# Patient Record
Sex: Male | Born: 1935 | Race: White | Hispanic: No | Marital: Single | Smoking: Former smoker
Health system: Southern US, Community
[De-identification: ages and names within clinical notes are randomized; demographics above are authoritative.]

## PROBLEM LIST (undated history)

## (undated) DIAGNOSIS — C61 Malignant neoplasm of prostate: Secondary | ICD-10-CM

## (undated) DIAGNOSIS — E785 Hyperlipidemia, unspecified: Secondary | ICD-10-CM

## (undated) DIAGNOSIS — I714 Abdominal aortic aneurysm, without rupture, unspecified: Secondary | ICD-10-CM

## (undated) DIAGNOSIS — I480 Paroxysmal atrial fibrillation: Secondary | ICD-10-CM

## (undated) DIAGNOSIS — R011 Cardiac murmur, unspecified: Secondary | ICD-10-CM

## (undated) DIAGNOSIS — J189 Pneumonia, unspecified organism: Secondary | ICD-10-CM

## (undated) DIAGNOSIS — I6529 Occlusion and stenosis of unspecified carotid artery: Secondary | ICD-10-CM

## (undated) DIAGNOSIS — E039 Hypothyroidism, unspecified: Secondary | ICD-10-CM

## (undated) DIAGNOSIS — I2510 Atherosclerotic heart disease of native coronary artery without angina pectoris: Secondary | ICD-10-CM

## (undated) DIAGNOSIS — K5900 Constipation, unspecified: Secondary | ICD-10-CM

## (undated) DIAGNOSIS — I10 Essential (primary) hypertension: Secondary | ICD-10-CM

## (undated) DIAGNOSIS — K219 Gastro-esophageal reflux disease without esophagitis: Secondary | ICD-10-CM

## (undated) DIAGNOSIS — I350 Nonrheumatic aortic (valve) stenosis: Secondary | ICD-10-CM

## (undated) DIAGNOSIS — D369 Benign neoplasm, unspecified site: Secondary | ICD-10-CM

## (undated) HISTORY — DX: Occlusion and stenosis of unspecified carotid artery: I65.29

## (undated) HISTORY — DX: Benign neoplasm, unspecified site: D36.9

## (undated) HISTORY — DX: Abdominal aortic aneurysm, without rupture, unspecified: I71.40

## (undated) HISTORY — DX: Paroxysmal atrial fibrillation: I48.0

## (undated) HISTORY — DX: Hyperlipidemia, unspecified: E78.5

## (undated) HISTORY — DX: Malignant neoplasm of prostate: C61

## (undated) HISTORY — PX: OTHER SURGICAL HISTORY: SHX169

## (undated) HISTORY — DX: Atherosclerotic heart disease of native coronary artery without angina pectoris: I25.10

## (undated) HISTORY — PX: CORONARY ARTERY BYPASS GRAFT: SHX141

## (undated) HISTORY — DX: Hypothyroidism, unspecified: E03.9

## (undated) HISTORY — DX: Essential (primary) hypertension: I10

## (undated) HISTORY — DX: Nonrheumatic aortic (valve) stenosis: I35.0

---

## 1999-03-17 ENCOUNTER — Encounter: Payer: Self-pay | Admitting: Emergency Medicine

## 1999-03-18 ENCOUNTER — Inpatient Hospital Stay (HOSPITAL_COMMUNITY): Admission: EM | Admit: 1999-03-18 | Discharge: 1999-03-25 | Payer: Self-pay | Admitting: Emergency Medicine

## 1999-03-19 ENCOUNTER — Encounter: Payer: Self-pay | Admitting: Cardiology

## 1999-03-20 ENCOUNTER — Encounter: Payer: Self-pay | Admitting: Thoracic Surgery (Cardiothoracic Vascular Surgery)

## 1999-03-21 ENCOUNTER — Encounter: Payer: Self-pay | Admitting: Thoracic Surgery (Cardiothoracic Vascular Surgery)

## 1999-03-22 ENCOUNTER — Encounter: Payer: Self-pay | Admitting: Thoracic Surgery (Cardiothoracic Vascular Surgery)

## 1999-07-02 ENCOUNTER — Observation Stay (HOSPITAL_COMMUNITY): Admission: RE | Admit: 1999-07-02 | Discharge: 1999-07-03 | Payer: Self-pay | Admitting: Cardiology

## 1999-07-02 HISTORY — PX: RENAL ARTERY STENT: SHX2321

## 1999-08-31 ENCOUNTER — Other Ambulatory Visit: Admission: RE | Admit: 1999-08-31 | Discharge: 1999-08-31 | Payer: Self-pay | Admitting: Urology

## 1999-09-13 ENCOUNTER — Encounter: Payer: Self-pay | Admitting: Urology

## 1999-09-13 ENCOUNTER — Encounter: Admission: RE | Admit: 1999-09-13 | Discharge: 1999-09-13 | Payer: Self-pay | Admitting: Urology

## 1999-09-24 ENCOUNTER — Encounter: Admission: RE | Admit: 1999-09-24 | Discharge: 1999-12-23 | Payer: Self-pay | Admitting: Radiation Oncology

## 2008-11-06 ENCOUNTER — Inpatient Hospital Stay (HOSPITAL_COMMUNITY): Admission: EM | Admit: 2008-11-06 | Discharge: 2008-11-08 | Payer: Self-pay | Admitting: Emergency Medicine

## 2010-08-30 ENCOUNTER — Telehealth: Payer: Self-pay

## 2010-08-30 NOTE — Telephone Encounter (Signed)
Records received Via Mail from [address] [facility] gave to [place] for NP appt to be made. 08/30/10/KM

## 2010-09-03 LAB — COMPREHENSIVE METABOLIC PANEL
AST: 19 U/L (ref 0–37)
AST: 22 U/L (ref 0–37)
Alkaline Phosphatase: 58 U/L (ref 39–117)
Alkaline Phosphatase: 70 U/L (ref 39–117)
BUN: 30 mg/dL — ABNORMAL HIGH (ref 6–23)
CO2: 23 mEq/L (ref 19–32)
CO2: 25 mEq/L (ref 19–32)
Chloride: 113 mEq/L — ABNORMAL HIGH (ref 96–112)
Chloride: 115 mEq/L — ABNORMAL HIGH (ref 96–112)
Creatinine, Ser: 1.5 mg/dL (ref 0.4–1.5)
Creatinine, Ser: 1.53 mg/dL — ABNORMAL HIGH (ref 0.4–1.5)
GFR calc Af Amer: 54 mL/min — ABNORMAL LOW (ref >60)
GFR calc Af Amer: 55 mL/min — ABNORMAL LOW (ref >60)
GFR calc non Af Amer: 45 mL/min — ABNORMAL LOW (ref >60)
GFR calc non Af Amer: 46 mL/min — ABNORMAL LOW (ref >60)
Potassium: 4.1 mEq/L (ref 3.5–5.1)
Total Bilirubin: 0.7 mg/dL (ref 0.3–1.2)
Total Bilirubin: 0.9 mg/dL (ref 0.3–1.2)

## 2010-09-03 LAB — LEGIONELLA ANTIGEN, URINE: Legionella Antigen, Urine: NEGATIVE

## 2010-09-03 LAB — CBC
HCT: 36 % — ABNORMAL LOW (ref 39.0–52.0)
HCT: 43.1 % (ref 39.0–52.0)
Hemoglobin: 12.1 g/dL — ABNORMAL LOW (ref 13.0–17.0)
MCHC: 34.1 g/dL (ref 30.0–36.0)
MCHC: 34.2 g/dL (ref 30.0–36.0)
MCV: 91.7 fL (ref 78.0–100.0)
MCV: 91.7 fL (ref 78.0–100.0)
Platelets: 111 10*3/uL — ABNORMAL LOW (ref 150–400)
RBC: 4.7 MIL/uL (ref 4.22–5.81)
WBC: 12.9 10*3/uL — ABNORMAL HIGH (ref 4.0–10.5)

## 2010-09-03 LAB — CULTURE, BLOOD (ROUTINE X 2): Culture: NO GROWTH

## 2010-09-03 LAB — LIPID PANEL
Cholesterol: 129 mg/dL (ref 0–200)
LDL Cholesterol: 80 mg/dL (ref 0–99)
Triglycerides: 77 mg/dL (ref <150)

## 2010-09-03 LAB — BASIC METABOLIC PANEL
BUN: 25 mg/dL — ABNORMAL HIGH (ref 6–23)
CO2: 26 mEq/L (ref 19–32)
Chloride: 114 mEq/L — ABNORMAL HIGH (ref 96–112)
Creatinine, Ser: 1.58 mg/dL — ABNORMAL HIGH (ref 0.4–1.5)
Glucose, Bld: 99 mg/dL (ref 70–99)
Potassium: 4.4 mEq/L (ref 3.5–5.1)

## 2010-09-03 LAB — POCT CARDIAC MARKERS
CKMB, poc: <1 ng/mL — ABNORMAL LOW (ref 1.0–8.0)
Myoglobin, poc: 110 ng/mL (ref 12–200)
Troponin i, poc: <0.05 ng/mL (ref 0.00–0.09)

## 2010-09-03 LAB — CULTURE, RESPIRATORY W GRAM STAIN: Culture: NORMAL

## 2010-09-03 LAB — DIFFERENTIAL
Basophils Absolute: 0 10*3/uL (ref 0.0–0.1)
Basophils Absolute: 0.5 10*3/uL — ABNORMAL HIGH (ref 0.0–0.1)
Basophils Relative: 0 % (ref 0–1)
Basophils Relative: 4 % — ABNORMAL HIGH (ref 0–1)
Eosinophils Absolute: 0.2 10*3/uL (ref 0.0–0.7)
Eosinophils Relative: 1 % (ref 0–5)
Eosinophils Relative: 2 % (ref 0–5)
Lymphocytes Relative: 13 % (ref 12–46)
Lymphocytes Relative: 5 % — ABNORMAL LOW (ref 12–46)
Monocytes Absolute: 0.8 10*3/uL (ref 0.1–1.0)

## 2010-09-03 LAB — URINALYSIS, ROUTINE W REFLEX MICROSCOPIC
Bilirubin Urine: NEGATIVE
Ketones, ur: NEGATIVE mg/dL
Nitrite: NEGATIVE
Protein, ur: NEGATIVE mg/dL
Urobilinogen, UA: 0.2 mg/dL (ref 0.0–1.0)
pH: 5.5 (ref 5.0–8.0)

## 2010-09-03 LAB — TSH: TSH: 0.642 u[IU]/mL (ref 0.350–4.500)

## 2010-09-03 LAB — EXPECTORATED SPUTUM ASSESSMENT W GRAM STAIN, RFLX TO RESP C

## 2010-09-03 LAB — D-DIMER, QUANTITATIVE: D-Dimer, Quant: 3.98 ug/mL-FEU — ABNORMAL HIGH (ref 0.00–0.48)

## 2010-10-09 NOTE — H&P (Signed)
Juan Benjamin, Juan Benjamin NO.:  [id number]   MEDICAL RECORD NO.:  [id number]          PATIENT TYPE:  INP   LOCATION:  1401                         FACILITY:  [facility]   PHYSICIAN:  Massie Maroon, MD        DATE OF BIRTH:  May 17, 1931   DATE OF ADMISSION:  11/06/2008  DATE OF DISCHARGE:                              HISTORY & PHYSICAL   CHIEF COMPLAINT:  Cough.   HISTORY OF PRESENT ILLNESS:  A 77 year old male with complaints of  weakness and coughing.  He notes coughing up some yellow sputum.  He  also had 1 episode of nausea and vomiting but no blood, and started  shaking like he had chills.  The patient was taken to the ER and found  on chest x-ray to have right upper lung and right lower lung  infiltrates.  There was question of nodule on chest x-ray and a CT scan  noncontrast was performed and negative for any lung nodule.  It did,  however, confirm that he did have pneumonia.  A D-dimer was sent off in  the ED and was apparently high at 3.98.  A VQ scan was performed and was  low likelihood for PE.  The patient will be admitted for pneumonia.   PAST MEDICAL HISTORY:  1. Hypertension.  2. Hyperlipidemia.  3. CAD.  4. PVD.  5. Carotid stenosis.  6. Prostate cancer status post XRT.  7. Hypothyroidism.  8. Gout.  9. Rhabdomyolysis with Simvastatin 80mg  and Gemfibrozil 600mg  bid   PAST SURGICAL HISTORY:  1. Right CEA.  2. CABG 2000.  3. Renal artery stenosis status post stent.   SOCIAL HISTORY:  The patient smoked 1 pack per day x20 years and then  quit in 1979.  He does not drink.  He was formerly a Psychologist, occupational.   FAMILY HISTORY:  None per the patient.   REVIEW OF SYSTEMS:  Negative for all 10 organ systems except for  pertinent positives stated above.   ALLERGIES:  VIOXX causes welts.   MEDICATIONS:  1. Allopurinol 300 mg daily.  2. Lisinopril 20 mg 1/2 daily.  3. Propranolol 40 mg daily.  4. Amlodipine 10 mg daily.  5. Doxazosin 8 mg daily.  6.  Levothyroxine 75 mcg daily.   PHYSICAL EXAM:  VITAL SIGNS:  Temperature 97.9, pulse 106, respiratory  rate 18, blood pressure 100/47.  HEENT:  Anicteric.  EOMI.  No nystagmus.  Pupils 1.5 mm, symmetric,  direct, consensual, near reflexes intact.  Mucous membranes are moist.  Tongue midline.  NECK:  Right CVA scar.  No bruits.  No thyromegaly.  No adenopathy.  Absence of noise in the left carotid.  HEART:  Midline scar.  Regular rate and rhythm.  S1, S2.  LUNGS:  Positive crackles, right lower lung.  No wheezes.  ABDOMEN:  Soft, obese, nontender, nondistended, positive bowel sounds.  EXTREMITIES:  No cyanosis, clubbing, or edema.  NEURO:  Nonfocal.  Cranial nerves 2-12 are intact.  Reflexes 2+,  symmetric, diffuse with downgoing toes bilaterally.  Motor strength 5/5  in all 4 extremities.  Pinprick  intact.  SKIN:  No rashes.  LYMPHATICS:  No adenopathy.   LABORATORY STUDIES:  1. CPK MB less than 1.0, troponin I less than 0.05, D-dimer 3.98      (high).  2. Sodium 142, potassium 4.1, chloride 113, bicarb 23, BUN 30,      creatinine 1.53, glucose 116.  AST 22, ALT 12,  3. WBC 12.9 (high), hemoglobin 14.2, platelet count 134,000.  4. Urinalysis negative.  5. Chest x-ray and CT scan chest as stated above in HPI.  6. VQ scan:  Low probability pulmonary embolus.  7. EKG:  Sinus tachycardia at 110.  Normal axis.  Prolonged PR      interval, indicating first degree AV block, prolonged QTc, Q waves      in II, III, aVF, no ST-T seg changes consistent with ST elevation      or ST depression.   ASSESSMENT:  1. Pneumonia of the right upper lung and right lower lung.  2. Nausea and vomiting, probably secondary to pneumonia.  3. D-dimer elevation secondary to pneumonia.  4. Hypertension.  5. Hyperlipidemia.  6. Coronary artery disease status post CABG.  7. Carotid stenosis status post right CEA.  8. Renal artery stenosis status post stent.  9. First degree atrioventricular block.   10.Acute renal failure.  11.Hypothyroidism.  12.Gout.  13.Hx of rhabdomyosis with simvastatin 80mg    PLAN:  1. Pneumonia:  The patient will be placed on Avelox 400 IV daily.      Blood cultures are pending and sputum gram stain cultures are      pending as well.  We will hydrate gently with normal saline IV.      Since VQ scan was low probability, we will not coagulate.  I      believe that there is a low probability of pulmonary embolism and      that his elevated D-dimer is most likely secondary to a combination      of pneumonia, age, renal insufficiency.  2. Acute renal failure:  The patient is probably prerenal.  If his      renal insufficiency persists on repeat BUN and creatinine, then      further workup will be obviously indicated.  I am not sure about      his baseline creatinine as well.  Physician in the morning can      gather further data and expand database.  3. CAD:  Will start the patient on Crestor 5 mg p.o. nightly for      cholesterol and the patient can continue on aspirin, propranolol,      amlodipine, and lisinopril.  4. Gout:  Will continue allopurinol.  If the patient continues to have      renal insufficiency, one might choose to use either allopurinol or      Uloric.  5. DVT prophylaxis:  Lovenox 30 mg subcutaneous daily.      Massie Maroon, MD  Electronically Signed     JYK/MEDQ  D:  11/06/2008  T:  11/06/2008  Job:  [id number]   cc:   Marjory Lies, M.D.  Fax: [phone]   Antionette Char, MD  Fax: [phone]   Lucrezia Starch. Earlene Plater, M.D.  Fax: [phone]

## 2010-10-09 NOTE — Discharge Summary (Signed)
[name], Juan Benjamin              ACCOUNT NO.:  [id number]   MEDICAL RECORD NO.:  [id number]          PATIENT TYPE:  INP   LOCATION:  1401                         FACILITY:  [facility]   PHYSICIAN:  Peggye Pitt, M.D. DATE OF BIRTH:  03-23-1931   DATE OF ADMISSION:  11/06/2008  DATE OF DISCHARGE:  11/08/2008                               DISCHARGE SUMMARY   DISCHARGE DIAGNOSES:  1. Pneumonia.  2. Hypertension.  3. Hyperlipidemia.  4. Hypothyroidism.  5. Coronary artery disease.  6. History of prostate cancer.  7. Gout.   DISCHARGE MEDICATIONS:  1. Avelox 400 mg daily for 7 more days.  2. Crestor 5 mg at bedtime.  3. Synthroid 75 mcg daily.  4. Norvasc 5 mg daily.  5. Allopurinol 300 mg daily.  6. Aspirin 325 mg daily.  7. Lisinopril 10 mg daily.  8. Propranolol 40 mg daily.  9. Doxazosin 8 mg daily.   DISPOSITION AND FOLLOWUP:  Juan Benjamin is discharged home today in  stable condition.  He is instructed to call his primary care physician  for a hospital followup appointment in about 2 weeks.  I do recommend  repeating a chest x-ray in 4 - 6 weeks to ensure complete resolution of  his pneumonia.   CONSULTATIONS THIS HOSPITALIZATION:  None.   IMAGES AND PROCEDURES:  1. Chest x-ray on November 06, 2008 that showed patchy areas of      atelectasis bilaterally, suspect left pleural effusion with a right      mid-lung nodule.  2. Chest CT without contrast on November 06, 2008 that showed no right mid-      lung nodule.  __________ atelectasis with a right lung infiltrate.  3. VQ scan on November 06, 2008 that showed a low likelihood ratio for a      pulmonary embolism.   HISTORY AND PHYSICAL EXAM:  For details please refer to dictation by Dr.  Selena Batten on November 06, 2008 but in brief, Juan Benjamin is a 77 year old man  initially admitted on November 06, 2008 secondary to weakness and coughing.  He was found to be somewhat hypoxic in the ED.  A D-dimer showed 3.98, a  VQ scan was done that  showed low probability for PE.  However, chest CT  did show evidence for pneumonia, hence he was admitted to our service.   HOSPITAL COURSE BY ACTIVE PROBLEM:  1. History of pneumonia, will continue Avelox for a total of 10 days,      he has 7 days remaining.  I would recommend repeating a chest x-ray      in 4 - 6 weeks to ensure complete resolution of his pneumonia.  2. Hypertension, he has maintained excellent blood pressure control in      the hospital.  3. Hyperlipidemia, he had an excellent fasting lipid profile as      follows:  Total cholesterol 129, triglycerides 77, HDL of 34 and an      LDL of 80.  4. Hypothyroidism, we have continued his home dose of Synthroid.  5. Rest of chronic medical issues have not been a  problem this      hospitalization.   Vital signs upon discharge:  Blood pressure 126/61, heart rate 67,  respirations 18, O2 sat is 96% with a temperature of 98.8.   Labs on day of discharge:  Sodium 144, potassium 4.4, chloride 114,  bicarb 26, BUN 25, creatinine 1.5, glucose of 99, WBCs 11.3, hemoglobin  12.3 and a platelet count of 111.      Peggye Pitt, M.D.  Electronically Signed     EH/MEDQ  D:  11/08/2008  T:  11/08/2008  Job:  [id number]   cc:   Marjory Lies, M.D.  Fax: [phone]

## 2010-12-12 ENCOUNTER — Encounter: Payer: Self-pay | Admitting: Cardiology

## 2010-12-13 ENCOUNTER — Encounter: Payer: Self-pay | Admitting: Cardiology

## 2010-12-13 ENCOUNTER — Ambulatory Visit (INDEPENDENT_AMBULATORY_CARE_PROVIDER_SITE_OTHER): Payer: Medicare Other | Admitting: Cardiology

## 2010-12-13 DIAGNOSIS — E785 Hyperlipidemia, unspecified: Secondary | ICD-10-CM

## 2010-12-13 DIAGNOSIS — I6529 Occlusion and stenosis of unspecified carotid artery: Secondary | ICD-10-CM

## 2010-12-13 DIAGNOSIS — I10 Essential (primary) hypertension: Secondary | ICD-10-CM

## 2010-12-13 DIAGNOSIS — R011 Cardiac murmur, unspecified: Secondary | ICD-10-CM | POA: Insufficient documentation

## 2010-12-13 DIAGNOSIS — I2510 Atherosclerotic heart disease of native coronary artery without angina pectoris: Secondary | ICD-10-CM

## 2010-12-13 NOTE — Assessment & Plan Note (Signed)
He is having no new symptoms. I will pursue aggressive risk reduction. I will reevaluate in 6 months to consider further stress testing as he now has a 75 year old bypass grafts.

## 2010-12-13 NOTE — Assessment & Plan Note (Signed)
The blood pressure is at target. No change in medications is indicated. We will continue with therapeutic lifestyle changes (TLC).  

## 2010-12-13 NOTE — Patient Instructions (Addendum)
Your physician has requested that you have a carotid duplex. This test is an ultrasound of the carotid arteries in your neck. It looks at blood flow through these arteries that supply the brain with blood. Allow one hour for this exam. There are no restrictions or special instructions.  Your physician has requested that you have an echocardiogram. Echocardiography is a painless test that uses sound waves to create images of your heart. It provides your doctor with information about the size and shape of your heart and how well your heart's chambers and valves are working. This procedure takes approximately one hour. There are no restrictions for this procedure.  Follow up in 6 months with Dr Antoine Poche.  You will receive a letter in the mail 2 months before you are due.  Please call us when you receive this letter to schedule your follow up appointment.

## 2010-12-13 NOTE — Progress Notes (Signed)
HPI The patient presents for evaluation of CAD.  He was previously seen by another cardiologist.  He has a long history of disease as described below. His last stress perfusion study was in 2009. This was negative for any evidence of ischemia. He seems to get along well and does do some activities such as yardwork. With this he might get some pain in his right posterior ribs but he denies any cardiovascular complaints. The patient denies any new symptoms such as chest discomfort, neck or arm discomfort. There has been no new shortness of breath, PND or orthopnea. There have been no reported palpitations, presyncope or syncope.  No Known Allergies  Current Outpatient Prescriptions  Medication Sig Dispense Refill  . allopurinol (ZYLOPRIM) 300 MG tablet Take 300 mg by mouth daily.        Marland Kitchen amLODipine (NORVASC) 5 MG tablet Take 5 mg by mouth daily.        Marland Kitchen aspirin 325 MG tablet Take 325 mg by mouth daily.        . benazepril (LOTENSIN) 40 MG tablet Take 20 mg by mouth daily.        Marland Kitchen DOXAZOSIN MESYLATE PO Take 8 mg by mouth daily.        Marland Kitchen levothyroxine (SYNTHROID, LEVOTHROID) 75 MCG tablet Take 75 mcg by mouth daily.        . Omega-3 Fatty Acids (FISH OIL PO) Take by mouth 2 (two) times daily.        . propranolol (INDERAL) 40 MG tablet Take 40 mg by mouth daily.        . rosuvastatin (CRESTOR) 10 MG tablet Take 5 mg by mouth daily.          Past Medical History  Diagnosis Date  . Coronary artery disease   . Hypertension   . Carotid artery occlusion     Chronic occlusion of left, R CEA  . Hyperlipidemia   . Benign tumor     gland hyperplasia  . MI, old   . Prostate cancer   . Hypothyroidism     Past Surgical History  Procedure Date  . Coronary artery bypass graft     CABG (4 vessel, 2000.  No report in [place])  . Carotid artery surgery     Right   . Renal artery stent 07/02/1999    RIGHT SIDE    Family History  Problem Relation Age of Onset  . Bone cancer Mother     History    Social History  . Marital Status: Single    Spouse Name: N/A    Number of Children: N/A  . Years of Education: N/A   Occupational History  . Retired    Social History Main Topics  . Smoking status: Former Smoker    Quit date: 12/12/1980  . Smokeless tobacco: Not on file  . Alcohol Use: No  . Drug Use: No  . Sexually Active: Not on file   Other Topics Concern  . Not on file   Social History Narrative   Lives at home with wife.  1 son    ROS: Positive for occasional dizziness. Otherwise as stated in the HPI and negative for all other systems.   PHYSICAL EXAM BP 106/64  Pulse 55  Ht 5' 8.5" (1.74 m)  Wt 172 lb (78.019 kg)  BMI 25.77 kg/m2 GENERAL:  Well appearing HEENT:  Pupils equal round and reactive, fundi not visualized, oral mucosa unremarkable, dentures NECK:  No jugular venous distention, waveform  within normal limits, carotid upstroke brisk and symmetric, right bruits, no thyromegaly, R CEA scar LYMPHATICS:  No cervical, inguinal adenopathy LUNGS:  Clear to auscultation bilaterally BACK:  No CVA tenderness CHEST:  Unremarkable HEART:  PMI not displaced or sustained,S1 and S2 within normal limits, no S3, no S4, no clicks, no rubs, apical early peaking systolic murmurs ABD:  Flat, positive bowel sounds normal in frequency in pitch, no bruits, no rebound, no guarding, no midline pulsatile mass, no hepatomegaly, no splenomegaly EXT:  2 plus pulses throughout, no edema, no cyanosis no clubbing SKIN:  No rashes no nodules NEURO:  Cranial nerves II through XII grossly intact, motor grossly intact throughout PSYCH:  Cognitively intact, oriented to person place and time  EKG: Sinus bradycardia.  Old inferior MI. No acute ST T wave changes.  ASSESSMENT AND PLAN

## 2010-12-13 NOTE — Assessment & Plan Note (Signed)
He had a previous diagnosis of aortic sclerosis but it has been years since echo. I will order this.

## 2010-12-13 NOTE — Assessment & Plan Note (Signed)
I will defer to Dr. Rosezetta Schlatter with a goal LDL less than 70 and HDL greater than 40.

## 2010-12-13 NOTE — Assessment & Plan Note (Signed)
He has not had carotid Dopplers in quite some time and I will order these.

## 2010-12-20 ENCOUNTER — Other Ambulatory Visit (HOSPITAL_COMMUNITY): Payer: PRIVATE HEALTH INSURANCE | Admitting: Radiology

## 2010-12-24 ENCOUNTER — Ambulatory Visit (HOSPITAL_COMMUNITY): Payer: Medicare Other | Attending: Cardiology | Admitting: Radiology

## 2010-12-24 ENCOUNTER — Encounter (INDEPENDENT_AMBULATORY_CARE_PROVIDER_SITE_OTHER): Payer: Medicare Other | Admitting: *Deleted

## 2010-12-24 DIAGNOSIS — I252 Old myocardial infarction: Secondary | ICD-10-CM | POA: Insufficient documentation

## 2010-12-24 DIAGNOSIS — I6529 Occlusion and stenosis of unspecified carotid artery: Secondary | ICD-10-CM

## 2010-12-24 DIAGNOSIS — E785 Hyperlipidemia, unspecified: Secondary | ICD-10-CM | POA: Insufficient documentation

## 2010-12-24 DIAGNOSIS — R011 Cardiac murmur, unspecified: Secondary | ICD-10-CM | POA: Insufficient documentation

## 2010-12-24 DIAGNOSIS — I10 Essential (primary) hypertension: Secondary | ICD-10-CM | POA: Insufficient documentation

## 2010-12-24 DIAGNOSIS — I679 Cerebrovascular disease, unspecified: Secondary | ICD-10-CM | POA: Insufficient documentation

## 2010-12-24 DIAGNOSIS — Z87891 Personal history of nicotine dependence: Secondary | ICD-10-CM | POA: Insufficient documentation

## 2010-12-26 ENCOUNTER — Encounter: Payer: Self-pay | Admitting: Cardiology

## 2011-01-02 NOTE — Progress Notes (Signed)
Quick Note:  RN left message to call back re: Echo and Carotid results.  ______

## 2011-01-02 NOTE — Progress Notes (Signed)
Quick Note:  RN left message to call back re: Echo and Carotid results.  ______ 

## 2011-01-04 ENCOUNTER — Telehealth: Payer: Self-pay | Admitting: Cardiology

## 2011-01-04 NOTE — Telephone Encounter (Signed)
Juan Benjamin is aware of his echo results.

## 2011-01-04 NOTE — Telephone Encounter (Signed)
Pt's dtr very upset about pt not getting results of echo, pt at the hospital with his wife right now and dtr requesting we call him with results within the hour before he has to leave

## 2011-01-10 ENCOUNTER — Encounter: Payer: Self-pay | Admitting: Cardiology

## 2011-01-11 ENCOUNTER — Encounter: Payer: Self-pay | Admitting: Cardiology

## 2011-03-15 ENCOUNTER — Encounter: Payer: Self-pay | Admitting: Cardiology

## 2011-12-25 ENCOUNTER — Other Ambulatory Visit: Payer: Self-pay | Admitting: *Deleted

## 2011-12-25 DIAGNOSIS — I6529 Occlusion and stenosis of unspecified carotid artery: Secondary | ICD-10-CM

## 2011-12-26 ENCOUNTER — Encounter (INDEPENDENT_AMBULATORY_CARE_PROVIDER_SITE_OTHER): Payer: Medicare Other

## 2011-12-26 DIAGNOSIS — I6529 Occlusion and stenosis of unspecified carotid artery: Secondary | ICD-10-CM

## 2011-12-31 ENCOUNTER — Telehealth: Payer: Self-pay | Admitting: Cardiology

## 2011-12-31 NOTE — Telephone Encounter (Signed)
Reviewed results with pt.  Schedule an appt for him to see Dr Antoine Poche as it has been over 1 year.  He denies any problems at this time.

## 2011-12-31 NOTE — Telephone Encounter (Signed)
PT RTN CALL RE CAROTID RESULTS

## 2012-01-29 ENCOUNTER — Encounter: Payer: Self-pay | Admitting: Cardiology

## 2012-01-29 ENCOUNTER — Ambulatory Visit (INDEPENDENT_AMBULATORY_CARE_PROVIDER_SITE_OTHER): Payer: Medicare Other | Admitting: Cardiology

## 2012-01-29 VITALS — BP 103/67 | HR 59 | Ht 68.0 in | Wt 166.0 lb

## 2012-01-29 DIAGNOSIS — I25810 Atherosclerosis of coronary artery bypass graft(s) without angina pectoris: Secondary | ICD-10-CM

## 2012-01-29 NOTE — Progress Notes (Signed)
HPI The patient presents for evaluation of CAD.  Since I last saw him he has done well. He has been limited by back pain to some degree. However, he does all the chores of daily living. With this he does not get chest pressure, neck or arm discomfort. He denies palpitations, presyncope or syncope. He has had no PND or orthopnea. He has had no weight gain or edema.  No Known Allergies  Current Outpatient Prescriptions  Medication Sig Dispense Refill  . allopurinol (ZYLOPRIM) 300 MG tablet Take 300 mg by mouth daily.        Marland Kitchen amLODipine (NORVASC) 5 MG tablet Take 5 mg by mouth daily.        Marland Kitchen aspirin 325 MG tablet Take 325 mg by mouth daily.        Marland Kitchen DOXAZOSIN MESYLATE PO Take 8 mg by mouth daily.        Marland Kitchen levothyroxine (SYNTHROID, LEVOTHROID) 75 MCG tablet Take 75 mcg by mouth daily.        . propranolol (INDERAL) 40 MG tablet Take 40 mg by mouth daily.        . rosuvastatin (CRESTOR) 10 MG tablet Take 5 mg by mouth daily.        . benazepril (LOTENSIN) 40 MG tablet Take 20 mg by mouth daily.          Past Medical History  Diagnosis Date  . Coronary artery disease   . Hypertension   . Carotid artery occlusion     Chronic occlusion of left, R CEA  . Hyperlipidemia   . Benign tumor     gland hyperplasia  . MI, old   . Prostate cancer   . Hypothyroidism     Past Surgical History  Procedure Date  . Coronary artery bypass graft     CABG (4 vessel, 2000.  No report in [place])  . Carotid artery surgery     Right   . Renal artery stent 07/02/1999    RIGHT SIDE    ROS: Positive for occasional dizziness. Otherwise as stated in the HPI and negative for all other systems.   PHYSICAL EXAM BP 103/67  Pulse 59  Ht 5\' 8"  (1.727 m)  Wt 166 lb (75.297 kg)  BMI 25.24 kg/m2 GENERAL:  Well appearing HEENT:  Pupils equal round and reactive, fundi not visualized, oral mucosa unremarkable, dentures NECK:  No jugular venous distention, waveform within normal limits, carotid upstroke brisk  and symmetric, right bruits, no thyromegaly, R CEA scar LYMPHATICS:  No cervical, inguinal adenopathy LUNGS:  Clear to auscultation bilaterally BACK:  No CVA tenderness CHEST:  Unremarkable HEART:  PMI not displaced or sustained,S1 and S2 within normal limits, no S3, no S4, no clicks, no rubs, apical early peaking systolic murmurs ABD:  Flat, positive bowel sounds normal in frequency in pitch, no bruits, no rebound, no guarding, no midline pulsatile mass, no hepatomegaly, no splenomegaly EXT:  2 plus pulses throughout, no edema, no cyanosis no clubbing SKIN:  No rashes no nodules NEURO:  Cranial nerves II through XII grossly intact, motor grossly intact throughout PSYCH:  Cognitively intact, oriented to person place and time  EKG: Sinus bradycardia, rate 59.  Old inferior MI. No acute ST T wave changes.  01/29/2012   ASSESSMENT AND PLAN  Coronary artery disease -  He is having no new symptoms. He now has a 76 year old bypass grafts and I will schedule him for a POET (Plain Old Exercise Test). This will allow  me to screen for obstructive coronary disease, risk stratify and very importantly provide a prescription for exercise.   Carotid artery occlusion -  He has an occluded left carotid and 0-39% right and will be followed up in one year. I reviewed the most recent study. The blood pressure is at target. No change in medications is indicated. We will continue with therapeutic lifestyle changes (TLC).    Hyperlipidemia -  I will defer to Dr. Rosezetta Schlatter with a goal LDL less than 70 and HDL greater than 40.   Murmur -  He had mild-to-moderate aortic stenosis on echo last year. I'll follow this up clinically and with repeat echoes in the future.    Back pain - This is his most significant complaint and I referred him back to Dr. Rosezetta Schlatter discussed this and possible referral to a spine specialist.

## 2012-01-29 NOTE — Patient Instructions (Addendum)
The current medical regimen is effective;  continue present plan and medications.  Your physician has requested that you have an exercise tolerance test. For further information please visit [email or web address]. Please also follow instruction sheet, as given.  Follow up in 1 year with Dr Hochrein.  You will receive a letter in the mail 2 months before you are due.  Please call us when you receive this letter to schedule your follow up appointment.  

## 2012-03-04 ENCOUNTER — Encounter: Payer: Medicare Other | Admitting: Physician Assistant

## 2012-10-06 ENCOUNTER — Other Ambulatory Visit: Payer: Self-pay | Admitting: Family Medicine

## 2012-10-06 ENCOUNTER — Ambulatory Visit
Admission: RE | Admit: 2012-10-06 | Discharge: 2012-10-06 | Disposition: A | Discharge status: Home / Self Care | Payer: Self-pay | Source: Ambulatory Visit | Attending: Family Medicine | Admitting: Family Medicine

## 2012-10-06 DIAGNOSIS — S0990XA Unspecified injury of head, initial encounter: Secondary | ICD-10-CM

## 2012-12-31 ENCOUNTER — Encounter (INDEPENDENT_AMBULATORY_CARE_PROVIDER_SITE_OTHER): Payer: Medicare Other

## 2012-12-31 DIAGNOSIS — I6529 Occlusion and stenosis of unspecified carotid artery: Secondary | ICD-10-CM

## 2014-01-27 ENCOUNTER — Other Ambulatory Visit (HOSPITAL_COMMUNITY): Payer: Self-pay | Admitting: Cardiology

## 2014-01-27 DIAGNOSIS — I6529 Occlusion and stenosis of unspecified carotid artery: Secondary | ICD-10-CM

## 2014-02-01 ENCOUNTER — Ambulatory Visit (HOSPITAL_COMMUNITY): Payer: Medicare Other | Attending: Cardiology | Admitting: Cardiology

## 2014-02-01 DIAGNOSIS — I6529 Occlusion and stenosis of unspecified carotid artery: Secondary | ICD-10-CM | POA: Diagnosis present

## 2014-02-01 DIAGNOSIS — Z87891 Personal history of nicotine dependence: Secondary | ICD-10-CM | POA: Diagnosis not present

## 2014-02-01 DIAGNOSIS — I10 Essential (primary) hypertension: Secondary | ICD-10-CM | POA: Insufficient documentation

## 2014-02-01 DIAGNOSIS — E785 Hyperlipidemia, unspecified: Secondary | ICD-10-CM | POA: Diagnosis not present

## 2014-02-01 DIAGNOSIS — Z951 Presence of aortocoronary bypass graft: Secondary | ICD-10-CM | POA: Insufficient documentation

## 2014-02-01 DIAGNOSIS — I2510 Atherosclerotic heart disease of native coronary artery without angina pectoris: Secondary | ICD-10-CM | POA: Diagnosis not present

## 2014-02-01 NOTE — Progress Notes (Signed)
Carotid duplex performed 

## 2014-02-07 ENCOUNTER — Other Ambulatory Visit: Payer: Self-pay | Admitting: *Deleted

## 2014-02-07 DIAGNOSIS — I6529 Occlusion and stenosis of unspecified carotid artery: Secondary | ICD-10-CM

## 2014-06-07 ENCOUNTER — Ambulatory Visit (INDEPENDENT_AMBULATORY_CARE_PROVIDER_SITE_OTHER): Payer: PPO | Admitting: Cardiology

## 2014-06-07 ENCOUNTER — Encounter: Payer: Self-pay | Admitting: Cardiology

## 2014-06-07 VITALS — BP 140/100 | HR 58 | Ht 67.0 in | Wt 162.2 lb

## 2014-06-07 DIAGNOSIS — I350 Nonrheumatic aortic (valve) stenosis: Secondary | ICD-10-CM

## 2014-06-07 DIAGNOSIS — I6523 Occlusion and stenosis of bilateral carotid arteries: Secondary | ICD-10-CM

## 2014-06-07 DIAGNOSIS — I2510 Atherosclerotic heart disease of native coronary artery without angina pectoris: Secondary | ICD-10-CM

## 2014-06-07 DIAGNOSIS — I10 Essential (primary) hypertension: Secondary | ICD-10-CM

## 2014-06-07 NOTE — Progress Notes (Signed)
HPI The patient presents for evaluation of CAD.  Since I last saw him he has done well he has done relatively well.  She has lost 30 lbs since his wife died three years ago.  He does all the chores of daily living. With this he does not get chest pressure, neck or arm discomfort. He denies palpitations, presyncope or syncope. He has had no PND or orthopnea. He has had no weight gain or edema.  He does some activity on a treadmill.    No Known Allergies  Current Outpatient Prescriptions  Medication Sig Dispense Refill  . allopurinol (ZYLOPRIM) 300 MG tablet Take 300 mg by mouth daily.      Marland Kitchen amLODipine (NORVASC) 5 MG tablet Take 5 mg by mouth daily.      Marland Kitchen aspirin 325 MG tablet Take 325 mg by mouth daily.      . benazepril (LOTENSIN) 40 MG tablet Take 20 mg by mouth daily.      Marland Kitchen levothyroxine (SYNTHROID, LEVOTHROID) 75 MCG tablet Take 75 mcg by mouth daily.      . propranolol (INDERAL) 40 MG tablet Take 40 mg by mouth daily.      . rosuvastatin (CRESTOR) 10 MG tablet Take 5 mg by mouth daily.       No current facility-administered medications for this visit.    Past Medical History  Diagnosis Date  . Coronary artery disease   . Hypertension   . Carotid artery occlusion     Chronic occlusion of left, R CEA  . Hyperlipidemia   . Benign tumor     gland hyperplasia  . MI, old   . Prostate cancer   . Hypothyroidism     Past Surgical History  Procedure Laterality Date  . Coronary artery bypass graft      CABG (4 vessel, 2000.  No report in [place])  . Carotid artery surgery      Right   . Renal artery stent  07/02/1999    RIGHT SIDE    ROS:  As stated in the HPI and negative for all other systems.   PHYSICAL EXAM BP 140/100 mmHg  Pulse 58  Ht 5\' 7"  (1.702 m)  Wt 162 lb 3.2 oz (73.573 kg)  BMI 25.40 kg/m2 GENERAL:  Well appearing HEENT:  Pupils equal round and reactive, fundi not visualized, oral mucosa unremarkable, dentures NECK:  No jugular venous distention,  waveform within normal limits, carotid upstroke brisk and symmetric, right bruits, no thyromegaly, R CEA scar LYMPHATICS:  No cervical, inguinal adenopathy LUNGS:  Clear to auscultation bilaterally BACK:  No CVA tenderness CHEST:  Unremarkable HEART:  PMI not displaced or sustained,S1 and S2 within normal limits, no S3, no S4, no clicks, no rubs, apical mid peaking systolic murmurs ABD:  Flat, positive bowel sounds normal in frequency in pitch, no bruits, no rebound, no guarding, no midline pulsatile mass, no hepatomegaly, no splenomegaly EXT:  2 plus pulses throughout, no edema, no cyanosis no clubbing SKIN:  No rashes no nodules NEURO:  Cranial nerves II through XII grossly intact, motor grossly intact throughout PSYCH:  Cognitively intact, oriented to person place and time  EKG: Sinus bradycardia, rate 58.  Old inferior MI. No acute ST T wave changes.  06/07/2014   ASSESSMENT AND PLAN  Coronary artery disease -  He is having no new symptoms. He now has a 79 year old bypass grafts and I will schedule him for a POET (Plain Old Exercise Test). This will allow  me to screen for obstructive coronary disease.   Carotid artery occlusion -  He has an occluded left carotid and 0-39% right and will be followed up again in Sept.     Hyperlipidemia -  I will defer to Dr. Marlyn Corporal with a goal LDL less than 70 and HDL greater than 40.   Murmur -  He had mild-to-moderate aortic stenosis on echo in 2013. I'll follow this up clinically and with repeat echo    Back pain - This is his most significant complaint and I referred him back to Dr. Marlyn Corporal discussed this and possible referral to a spine specialist.

## 2014-06-07 NOTE — Patient Instructions (Signed)
Dr Percival Spanish has ordered the following test(s) to be done: 1. Echocardiogram - Your physician has requested that you have an echocardiogram. Echocardiography is a painless test that uses sound waves to create images of your heart. It provides your doctor with information about the size and shape of your heart and how well your heart's chambers and valves are working. This procedure takes approximately one hour. There are no restrictions for this procedure. 2. Exercise Tolerance Test - Your physician has requested that you have an exercise tolerance test. For further information please visit [email or web address]. Please also follow instruction sheet, as given.  Dr Percival Spanish wants you to follow-up in 1 year. You will receive a reminder letter in the mail one months in advance. If you don't receive a letter, please call our office to schedule the follow-up appointment.

## 2014-06-24 ENCOUNTER — Ambulatory Visit (HOSPITAL_COMMUNITY)
Admission: RE | Admit: 2014-06-24 | Discharge: 2014-06-24 | Disposition: A | Discharge status: Home / Self Care | Payer: PPO | Source: Ambulatory Visit | Attending: Internal Medicine | Admitting: Internal Medicine

## 2014-06-24 ENCOUNTER — Encounter (HOSPITAL_COMMUNITY): Payer: PPO

## 2014-06-24 DIAGNOSIS — I10 Essential (primary) hypertension: Secondary | ICD-10-CM | POA: Insufficient documentation

## 2014-06-24 DIAGNOSIS — E785 Hyperlipidemia, unspecified: Secondary | ICD-10-CM | POA: Insufficient documentation

## 2014-06-24 DIAGNOSIS — I350 Nonrheumatic aortic (valve) stenosis: Secondary | ICD-10-CM | POA: Insufficient documentation

## 2014-06-24 NOTE — Progress Notes (Signed)
2D Echocardiogram Complete.  06/24/2014   Krystan Northrop, RDCS  

## 2015-05-31 DIAGNOSIS — J22 Unspecified acute lower respiratory infection: Secondary | ICD-10-CM | POA: Diagnosis not present

## 2015-05-31 DIAGNOSIS — B370 Candidal stomatitis: Secondary | ICD-10-CM | POA: Diagnosis not present

## 2015-06-18 NOTE — Progress Notes (Signed)
HPI The patient presents for evaluation of CAD.  Since I last saw him he has had no new complaints. He is active around his house. The patient denies any new symptoms such as chest discomfort, neck or arm discomfort. There has been no new shortness of breath, PND or orthopnea. There have been no reported palpitations, presyncope or syncope.    He did have mild AS and normal LV function on echo last year.    I did want him to have a treadmill last year but he decided he didn't want to have this done.   No Known Allergies  Current Outpatient Prescriptions  Medication Sig Dispense Refill  . allopurinol (ZYLOPRIM) 300 MG tablet Take 300 mg by mouth daily.      Marland Kitchen amLODipine (NORVASC) 5 MG tablet Take 5 mg by mouth daily.      Marland Kitchen aspirin 325 MG tablet Take 325 mg by mouth daily.      . benazepril (LOTENSIN) 40 MG tablet Take 20 mg by mouth daily.      Marland Kitchen levothyroxine (SYNTHROID, LEVOTHROID) 75 MCG tablet Take 75 mcg by mouth daily.      . propranolol (INDERAL) 40 MG tablet Take 40 mg by mouth daily.      . rosuvastatin (CRESTOR) 10 MG tablet Take 5 mg by mouth daily.       No current facility-administered medications for this visit.    Past Medical History  Diagnosis Date  . Coronary artery disease   . Hypertension   . Carotid artery occlusion     Chronic occlusion of left, R CEA  . Hyperlipidemia   . Benign tumor     gland hyperplasia  . MI, old   . Prostate cancer ([place])   . Hypothyroidism     Past Surgical History  Procedure Laterality Date  . Coronary artery bypass graft      CABG (4 vessel, 2000.  No report in [place])  . Carotid artery surgery      Right   . Renal artery stent  07/02/1999    RIGHT SIDE    ROS:  As stated in the HPI and negative for all other systems.   PHYSICAL EXAM BP 134/72 mmHg  Pulse 62  Ht 5\' 7"  (1.702 m)  Wt 159 lb (72.122 kg)  BMI 24.90 kg/m2 GENERAL:  Well appearing HEENT:  Pupils equal round and reactive, fundi not visualized,  dentures NECK:  No jugular venous distention, waveform within normal limits, carotid upstroke brisk and symmetric, right bruits, no thyromegaly, R CEA scar LYMPHATICS:  No cervical, inguinal adenopathy LUNGS:  Clear to auscultation bilaterally BACK:  No CVA tenderness CHEST:  Well healed sternotomy scar. HEART:  PMI not displaced or sustained,S1 and S2 within normal limits, no S3, no S4, no clicks, no rubs, apical mid peaking systolic murmurs ABD:  Flat, positive bowel sounds normal in frequency in pitch, no bruits, no rebound, no guarding, no midline pulsatile mass, no hepatomegaly, no splenomegaly EXT:  2 plus pulses throughout, no edema, no cyanosis no clubbing, mild bruising  EKG: sinus rhythm, rate 62, axis within normal limits, intervals within normal limits, old inferior infarct, PVC.  06/19/2015   ASSESSMENT AND PLAN  Coronary artery disease -  He has not wanted to have further stress testing.Marland Kitchen He now has a 80 year-old bypass grafts.  He will continue with risk reduction.  Carotid artery occlusion -  He has an occluded left carotid and 0-39% right in the past.  He does agree to have follow-up Dopplers.  Hyperlipidemia -  I will defer to Dr. Marlyn Corporal with a goal LDL less than 70 and HDL greater than 40.   Murmur -  This was evaluated with echo last year.  This is not clinically change. No change in therapy is indicated.

## 2015-06-19 ENCOUNTER — Ambulatory Visit (INDEPENDENT_AMBULATORY_CARE_PROVIDER_SITE_OTHER): Payer: PPO | Admitting: Cardiology

## 2015-06-19 ENCOUNTER — Encounter: Payer: Self-pay | Admitting: Cardiology

## 2015-06-19 VITALS — BP 134/72 | HR 62 | Ht 67.0 in | Wt 159.0 lb

## 2015-06-19 DIAGNOSIS — I2510 Atherosclerotic heart disease of native coronary artery without angina pectoris: Secondary | ICD-10-CM

## 2015-06-19 DIAGNOSIS — I6523 Occlusion and stenosis of bilateral carotid arteries: Secondary | ICD-10-CM

## 2015-06-19 NOTE — Patient Instructions (Addendum)
Dr Percival Spanish has recommended making the following medication changes: STOP full strength aspirin CHANGE Aspirin to 81 mg  Your physician has requested that you have a carotid duplex. This test is an ultrasound of the carotid arteries in your neck. It looks at blood flow through these arteries that supply the brain with blood. Allow one hour for this exam. There are no restrictions or special instructions.  Dr Percival Spanish recommends that you schedule a follow-up appointment in 1 year. You will receive a reminder letter in the mail two months in advance. If you don't receive a letter, please call our office to schedule the follow-up appointment.  If you need a refill on your cardiac medications before your next appointment, please call your pharmacy.

## 2015-07-26 ENCOUNTER — Inpatient Hospital Stay (HOSPITAL_COMMUNITY): Admission: RE | Admit: 2015-07-26 | Payer: PPO | Source: Ambulatory Visit

## 2015-09-04 DIAGNOSIS — Z961 Presence of intraocular lens: Secondary | ICD-10-CM | POA: Diagnosis not present

## 2015-10-27 DIAGNOSIS — C44311 Basal cell carcinoma of skin of nose: Secondary | ICD-10-CM | POA: Diagnosis not present

## 2015-10-27 DIAGNOSIS — D0320 Melanoma in situ of unspecified ear and external auricular canal: Secondary | ICD-10-CM | POA: Diagnosis not present

## 2015-10-27 DIAGNOSIS — L821 Other seborrheic keratosis: Secondary | ICD-10-CM | POA: Diagnosis not present

## 2015-10-27 DIAGNOSIS — D485 Neoplasm of uncertain behavior of skin: Secondary | ICD-10-CM | POA: Diagnosis not present

## 2015-11-07 DIAGNOSIS — D0322 Melanoma in situ of left ear and external auricular canal: Secondary | ICD-10-CM | POA: Diagnosis not present

## 2015-11-07 DIAGNOSIS — D485 Neoplasm of uncertain behavior of skin: Secondary | ICD-10-CM | POA: Diagnosis not present

## 2016-01-10 DIAGNOSIS — C44311 Basal cell carcinoma of skin of nose: Secondary | ICD-10-CM | POA: Diagnosis not present

## 2016-02-27 DIAGNOSIS — Z23 Encounter for immunization: Secondary | ICD-10-CM | POA: Diagnosis not present

## 2016-03-14 DIAGNOSIS — E038 Other specified hypothyroidism: Secondary | ICD-10-CM | POA: Diagnosis not present

## 2016-03-14 DIAGNOSIS — E039 Hypothyroidism, unspecified: Secondary | ICD-10-CM | POA: Diagnosis not present

## 2016-03-14 DIAGNOSIS — I10 Essential (primary) hypertension: Secondary | ICD-10-CM | POA: Diagnosis not present

## 2016-03-14 DIAGNOSIS — E7800 Pure hypercholesterolemia, unspecified: Secondary | ICD-10-CM | POA: Diagnosis not present

## 2016-03-14 DIAGNOSIS — I350 Nonrheumatic aortic (valve) stenosis: Secondary | ICD-10-CM | POA: Diagnosis not present

## 2016-03-14 DIAGNOSIS — M109 Gout, unspecified: Secondary | ICD-10-CM | POA: Diagnosis not present

## 2016-06-20 ENCOUNTER — Ambulatory Visit: Payer: PPO | Admitting: Cardiology

## 2016-07-11 ENCOUNTER — Ambulatory Visit (INDEPENDENT_AMBULATORY_CARE_PROVIDER_SITE_OTHER): Payer: PPO | Admitting: Cardiology

## 2016-07-11 ENCOUNTER — Encounter: Payer: Self-pay | Admitting: Cardiology

## 2016-07-11 VITALS — BP 146/75 | HR 67 | Ht 67.0 in | Wt 159.4 lb

## 2016-07-11 DIAGNOSIS — I2510 Atherosclerotic heart disease of native coronary artery without angina pectoris: Secondary | ICD-10-CM | POA: Diagnosis not present

## 2016-07-11 DIAGNOSIS — I6523 Occlusion and stenosis of bilateral carotid arteries: Secondary | ICD-10-CM

## 2016-07-11 NOTE — Patient Instructions (Signed)
Medication Instructions:  Continue current medications  Labwork: None Ordered  Testing/Procedures: Your physician has requested that you have a carotid duple in April. This test is an ultrasound of the carotid arteries in your neck. It looks at blood flow through these arteries that supply the brain with blood. Allow one hour for this exam. There are no restrictions or special instructions.   Follow-Up: Your physician wants you to follow-up in: 1 Year. You will receive a reminder letter in the mail two months in advance. If you don't receive a letter, please call our office to schedule the follow-up appointment.   Any Other Special Instructions Will Be Listed Below (If Applicable).   If you need a refill on your cardiac medications before your next appointment, please call your pharmacy.

## 2016-07-11 NOTE — Progress Notes (Signed)
HPI The patient presents for evaluation of CAD.  Unfortunately his son died a few days ago and he's having the funeral tomorrow. The patient is remarkably sanguine about this.  He has had no new complaints. He is active around his house. The patient denies any new symptoms such as chest discomfort, neck or arm discomfort. There has been no new shortness of breath, PND or orthopnea. There have been no reported palpitations, presyncope or syncope.    He did have mild AS and normal LV function on echo two years ago.    I did want him to have a treadmill last year but he decided he didn't want to have this done.   Allergies  Allergen Reactions  . Indomethacin Other (See Comments)    unknown  . Other Other (See Comments)    unknown  . Rofecoxib Other (See Comments)    unknown    Current Outpatient Prescriptions  Medication Sig Dispense Refill  . allopurinol (ZYLOPRIM) 300 MG tablet Take 300 mg by mouth daily.      Marland Kitchen amLODipine (NORVASC) 5 MG tablet Take 5 mg by mouth daily.      Marland Kitchen aspirin EC 81 MG tablet Take 1 tablet by mouth daily.    . benazepril (LOTENSIN) 40 MG tablet Take 20 mg by mouth daily.      Marland Kitchen levothyroxine (SYNTHROID, LEVOTHROID) 75 MCG tablet Take 75 mcg by mouth daily.      . propranolol (INDERAL) 40 MG tablet Take 40 mg by mouth daily.      . rosuvastatin (CRESTOR) 10 MG tablet Take 5 mg by mouth daily.       No current facility-administered medications for this visit.     Past Medical History:  Diagnosis Date  . Benign tumor    gland hyperplasia  . Carotid artery occlusion    Chronic occlusion of left, R CEA  . Coronary artery disease   . Hyperlipidemia   . Hypertension   . Hypothyroidism   . Prostate cancer [facility])     Past Surgical History:  Procedure Laterality Date  . CAROTID ARTERY SURGERY     Right   . CORONARY ARTERY BYPASS GRAFT     CABG (4 vessel, 2000.  No report in [place])  . RENAL ARTERY STENT  07/02/1999   RIGHT SIDE    ROS:  As stated in  the HPI and negative for all other systems.   PHYSICAL EXAM BP (!) 146/75   Pulse 67   Ht 5\' 7"  (1.702 m)   Wt 159 lb 6.4 oz (72.3 kg)   SpO2 93%   BMI 24.97 kg/m  GENERAL:  Well appearing HEENT:  Pupils equal round and reactive, fundi not visualized, dentures NECK:  No jugular venous distention, waveform within normal limits, carotid upstroke brisk and symmetric, right bruits, no thyromegaly, R CEA scar LYMPHATICS:  No cervical, inguinal adenopathy LUNGS:  Mild left lung wheeze  BACK:  No CVA tenderness CHEST:  Well healed sternotomy scar. HEART:  PMI not displaced or sustained,S1 and S2 within normal limits, no S3, no S4, no clicks, no rubs, apical mid peaking systolic murmurs ABD:  Flat, positive bowel sounds normal in frequency in pitch, no bruits, no rebound, no guarding, no midline pulsatile mass, no hepatomegaly, no splenomegaly EXT:  2 plus pulses throughout, no edema, no cyanosis no clubbing, mild bruising  EKG: sinus rhythm, rate 56, axis within normal limits, intervals within normal limits, old inferior infarct.  07/12/2016  ASSESSMENT AND PLAN  Coronary artery disease -  He has not wanted to have further stress testing. He now has a 81 year-old bypass grafts.  He will continue with risk reduction.  Carotid artery occlusion -  He has an occluded left carotid and 0-39% right in the past.   He was going to have Dopplers last year but he did not and I will try to reschedule this.   Hyperlipidemia -  This is followed by his PCP.    Murmur -  This was evaluated with echo in 2016.  This is not clinically changed. No change in therapy is indicated.

## 2016-07-12 ENCOUNTER — Encounter: Payer: Self-pay | Admitting: Cardiology

## 2016-07-12 ENCOUNTER — Ambulatory Visit: Payer: PPO | Admitting: Cardiology

## 2016-07-15 NOTE — Addendum Note (Signed)
Addended by: Vennie Homans on: 07/15/2016 08:42 AM   Modules accepted: Orders

## 2016-07-25 ENCOUNTER — Encounter (HOSPITAL_COMMUNITY): Payer: PPO

## 2016-08-20 DIAGNOSIS — J029 Acute pharyngitis, unspecified: Secondary | ICD-10-CM | POA: Diagnosis not present

## 2016-08-20 DIAGNOSIS — J209 Acute bronchitis, unspecified: Secondary | ICD-10-CM | POA: Diagnosis not present

## 2016-09-02 DIAGNOSIS — J302 Other seasonal allergic rhinitis: Secondary | ICD-10-CM | POA: Diagnosis not present

## 2016-09-02 DIAGNOSIS — J45901 Unspecified asthma with (acute) exacerbation: Secondary | ICD-10-CM | POA: Diagnosis not present

## 2016-09-04 ENCOUNTER — Encounter (HOSPITAL_COMMUNITY): Payer: PPO

## 2016-09-05 DIAGNOSIS — Z961 Presence of intraocular lens: Secondary | ICD-10-CM | POA: Diagnosis not present

## 2016-09-13 ENCOUNTER — Ambulatory Visit (HOSPITAL_COMMUNITY)
Admission: RE | Admit: 2016-09-13 | Discharge: 2016-09-13 | Disposition: A | Discharge status: Home / Self Care | Payer: PPO | Source: Ambulatory Visit | Attending: Cardiovascular Disease | Admitting: Cardiovascular Disease

## 2016-09-13 DIAGNOSIS — I6523 Occlusion and stenosis of bilateral carotid arteries: Secondary | ICD-10-CM | POA: Diagnosis not present

## 2016-11-27 ENCOUNTER — Encounter (HOSPITAL_COMMUNITY): Payer: Self-pay | Admitting: Emergency Medicine

## 2016-11-27 ENCOUNTER — Emergency Department (HOSPITAL_COMMUNITY)
Admission: EM | Admit: 2016-11-27 | Discharge: 2016-11-28 | Disposition: A | Discharge status: Home / Self Care | Payer: PPO | Attending: Emergency Medicine | Admitting: Emergency Medicine

## 2016-11-27 DIAGNOSIS — Y929 Unspecified place or not applicable: Secondary | ICD-10-CM | POA: Insufficient documentation

## 2016-11-27 DIAGNOSIS — Y939 Activity, unspecified: Secondary | ICD-10-CM | POA: Insufficient documentation

## 2016-11-27 DIAGNOSIS — Z79899 Other long term (current) drug therapy: Secondary | ICD-10-CM | POA: Insufficient documentation

## 2016-11-27 DIAGNOSIS — I10 Essential (primary) hypertension: Secondary | ICD-10-CM | POA: Insufficient documentation

## 2016-11-27 DIAGNOSIS — T7840XA Allergy, unspecified, initial encounter: Secondary | ICD-10-CM

## 2016-11-27 DIAGNOSIS — W57XXXA Bitten or stung by nonvenomous insect and other nonvenomous arthropods, initial encounter: Secondary | ICD-10-CM | POA: Insufficient documentation

## 2016-11-27 DIAGNOSIS — Z8546 Personal history of malignant neoplasm of prostate: Secondary | ICD-10-CM | POA: Insufficient documentation

## 2016-11-27 DIAGNOSIS — Z87891 Personal history of nicotine dependence: Secondary | ICD-10-CM | POA: Diagnosis not present

## 2016-11-27 DIAGNOSIS — Z23 Encounter for immunization: Secondary | ICD-10-CM | POA: Diagnosis not present

## 2016-11-27 DIAGNOSIS — E039 Hypothyroidism, unspecified: Secondary | ICD-10-CM | POA: Insufficient documentation

## 2016-11-27 DIAGNOSIS — Y999 Unspecified external cause status: Secondary | ICD-10-CM | POA: Diagnosis not present

## 2016-11-27 DIAGNOSIS — Z7982 Long term (current) use of aspirin: Secondary | ICD-10-CM | POA: Insufficient documentation

## 2016-11-27 DIAGNOSIS — I2510 Atherosclerotic heart disease of native coronary artery without angina pectoris: Secondary | ICD-10-CM | POA: Diagnosis not present

## 2016-11-27 DIAGNOSIS — Z951 Presence of aortocoronary bypass graft: Secondary | ICD-10-CM | POA: Diagnosis not present

## 2016-11-27 DIAGNOSIS — L299 Pruritus, unspecified: Secondary | ICD-10-CM | POA: Diagnosis not present

## 2016-11-27 MED ORDER — TETANUS-DIPHTH-ACELL PERTUSSIS 5-2.5-18.5 LF-MCG/0.5 IM SUSP
0.5000 mL | Freq: Once | INTRAMUSCULAR | Status: AC
  Administered 2016-11-27: 0.5 mL via INTRAMUSCULAR
  Filled 2016-11-27: qty 0.5

## 2016-11-27 MED ORDER — METHYLPREDNISOLONE SODIUM SUCC 125 MG IJ SOLR
125.0000 mg | Freq: Once | INTRAMUSCULAR | Status: AC
  Administered 2016-11-27: 125 mg via INTRAVENOUS
  Filled 2016-11-27: qty 2

## 2016-11-27 NOTE — ED Provider Notes (Signed)
[place] DEPT Provider Note   CSN: [id number] Arrival date & time: 11/27/16  2255  By signing my name below, I, [name], attest that this documentation has been prepared under the direction and in the presence of Wood, Bohdan Macho, MD. Electronically Signed: [name], ED Scribe. 11/27/16. 11:25 PM.  History   Chief Complaint Chief Complaint  Patient presents with  . Insect Bite    HPI Juan Benjamin is a 83 y.o. male who presents to the Emergency Department complaining of a sudden insect bite on his left ear lobe that occurred ~ 7 pm. Pt is unsure what bit him. Associated sx include abdominal pain, SOB, itching and hives on his face, and bilateral hands and feet. He took benadryl ~ 9 pm with mild relief. Tetanus is NOT UTD. Pt denies fever and chills.  The history is provided by the patient. No language interpreter was used.  Allergic Reaction  Presenting symptoms: itching and rash   Presenting symptoms: no difficulty breathing and no difficulty swallowing   Severity:  Moderate Prior allergic episodes:  No prior episodes Context: not animal exposure   Relieved by:  Nothing Worsened by:  Nothing Ineffective treatments:  None tried   Past Medical History:  Diagnosis Date  . Benign tumor    gland hyperplasia  . Carotid artery occlusion    Chronic occlusion of left, R CEA  . Coronary artery disease   . Hyperlipidemia   . Hypertension   . Hypothyroidism   . Prostate cancer [facility])     Patient Active Problem List   Diagnosis Date Noted  . Murmur 12/13/2010  . Coronary artery disease   . Hypertension   . Carotid artery occlusion   . Hyperlipidemia     Past Surgical History:  Procedure Laterality Date  . CAROTID ARTERY SURGERY     Right   . CORONARY ARTERY BYPASS GRAFT     CABG (4 vessel, 2000.  No report in [place])  . RENAL ARTERY STENT  07/02/1999   RIGHT SIDE       Home Medications    Prior to Admission medications   Medication Sig Start  Date End Date Taking? Authorizing Provider  allopurinol (ZYLOPRIM) 300 MG tablet Take 300 mg by mouth daily.      [provider]  amLODipine (NORVASC) 5 MG tablet Take 5 mg by mouth daily.      [provider]  aspirin EC 81 MG tablet Take 1 tablet by mouth daily.    [provider]  benazepril (LOTENSIN) 40 MG tablet Take 20 mg by mouth daily.      [provider]  levothyroxine (SYNTHROID, LEVOTHROID) 75 MCG tablet Take 75 mcg by mouth daily.      [provider]  propranolol (INDERAL) 40 MG tablet Take 40 mg by mouth daily.      [provider]  rosuvastatin (CRESTOR) 10 MG tablet Take 5 mg by mouth daily.      [provider]    Family History Family History  Problem Relation Age of Onset  . Bone cancer Mother     Social History Social History  Substance Use Topics  . Smoking status: Former Smoker    Quit date: 12/12/1980  . Smokeless tobacco: Never Used  . Alcohol use No     Allergies   Indomethacin; Other; and Rofecoxib   Review of Systems Review of Systems  Constitutional: Negative for chills and fever.  HENT: Negative for trouble swallowing.  Gastrointestinal: Negative for blood in stool.  Skin: Positive for itching, rash and wound.  All other systems reviewed and are negative.    Physical Exam Updated Vital Signs BP (!) 146/98 (BP Location: Right Arm)   Pulse 66   Temp 97.8 F (36.6 C) (Oral)   Resp 18   SpO2 99%   Physical Exam  Constitutional: He appears well-developed and well-nourished.  HENT:  Head: Normocephalic and atraumatic.  Mouth/Throat: Oropharynx is clear and moist. No oropharyngeal exudate.  Eyes: Conjunctivae and EOM are normal. Pupils are equal, round, and reactive to light. Right eye exhibits no discharge. Left eye exhibits no discharge. No scleral icterus.  Neck: Normal range of motion. Neck supple. No JVD present. No tracheal deviation present.  Trachea is midline. No  stridor or carotid bruits.  Cardiovascular: Normal rate, regular rhythm, normal heart sounds and intact distal pulses.   No murmur heard. Pulmonary/Chest: Effort normal and breath sounds normal. No stridor. No respiratory distress. He has no wheezes. He has no rales.  Lungs CTA bilaterally.  Abdominal: Soft. Bowel sounds are normal. He exhibits no distension. There is no tenderness. There is no rebound and no guarding.  Musculoskeletal: Normal range of motion. He exhibits no edema or tenderness.  All compartments are soft. No palpable cords.   Lymphadenopathy:    He has no cervical adenopathy.  Neurological: He is alert. He has normal reflexes. He displays normal reflexes.  Skin: Skin is warm and dry. Capillary refill takes less than 2 seconds. No rash noted. No erythema. No pallor.  Psychiatric: He has a normal mood and affect. His behavior is normal.  Nursing note and vitals reviewed.    ED Treatments / Results   Vitals:   11/27/16 2303  BP: (!) 146/98  Pulse: 66  Resp: 18  Temp: 97.8 F (36.6 C)    DIAGNOSTIC STUDIES: Oxygen Saturation is 99% on RA, normal by my interpretation.   COORDINATION OF CARE: 11:25 PM-Discussed next steps with pt which includes steroids and watching him for ~ 45 minutes. Pt verbalized understanding and is agreeable with the plan.    Procedures Procedures (including critical care time)  Medications Ordered in ED  Medications  methylPREDNISolone sodium succinate (SOLU-MEDROL) 125 mg/2 mL injection 125 mg (125 mg Intravenous Given 11/27/16 2358)  Tdap (BOOSTRIX) injection 0.5 mL (0.5 mLs Intramuscular Given 11/27/16 2359)     Feeling good post medication.  No lesions no swelling no wheezes on repeat exam .   Final Clinical Impressions(s) / ED Diagnoses   Return for shortness of breath, swelling of the lips, tongue or uvula, chest pain with exertion, shortness of breath with exertion, altered level of consciousness,bleeding or any concerns.  Continue benadryl and zantac and will add steroids. Follow up with your own doctor for recheck.  The patient is nontoxic-appearing on exam and vital signs are within normal limits.   I have reviewed the triage vital signs and the nursing notes. Pertinent labs &imaging results that were available during my care of the patient were reviewed by me and considered in my medical decision making (see chart for details).  After history, exam, and medical workup I feel the patient has been appropriately medically screened and is safe for discharge home. Pertinent diagnoses were discussed with the patient. Patient was given return precautions.    I personally performed the services described in this documentation, which was scribed in my presence. The recorded information has been reviewed and is accurate.  Hartwell Vandiver, MD 11/28/16 [phone]

## 2016-11-27 NOTE — ED Notes (Signed)
Bed: HT98 Expected date:  Expected time:  Means of arrival:  Comments: 83 yo M  Bee sting

## 2016-11-27 NOTE — ED Triage Notes (Signed)
Pt from home via EMS.  Bee sting L ear tragus about an hour ago.  Reports iching & hives in bilat hands & feet.  GI upset/aching.  Lbbored breathing.  Took 75 benadryl at home and 150 zantac.  20 G LW. VS: 140/75, 78 bpm, 98% RA

## 2016-11-28 ENCOUNTER — Encounter (HOSPITAL_COMMUNITY): Payer: Self-pay | Admitting: Emergency Medicine

## 2016-11-28 MED ORDER — RANITIDINE HCL 150 MG PO TABS: 150.0000 mg | ORAL_TABLET | Freq: Two times a day (BID) | ORAL | 0 refills | Status: DC

## 2016-11-28 MED ORDER — PREDNISONE 20 MG PO TABS: ORAL_TABLET | ORAL | 0 refills | Status: DC

## 2016-12-12 DIAGNOSIS — D485 Neoplasm of uncertain behavior of skin: Secondary | ICD-10-CM | POA: Diagnosis not present

## 2016-12-12 DIAGNOSIS — C44222 Squamous cell carcinoma of skin of right ear and external auricular canal: Secondary | ICD-10-CM | POA: Diagnosis not present

## 2017-01-15 DIAGNOSIS — C44222 Squamous cell carcinoma of skin of right ear and external auricular canal: Secondary | ICD-10-CM | POA: Diagnosis not present

## 2017-01-20 ENCOUNTER — Emergency Department (HOSPITAL_COMMUNITY)
Admission: EM | Admit: 2017-01-20 | Discharge: 2017-01-20 | Disposition: A | Discharge status: Home / Self Care | Payer: PPO | Attending: Emergency Medicine | Admitting: Emergency Medicine

## 2017-01-20 ENCOUNTER — Encounter (HOSPITAL_COMMUNITY): Payer: Self-pay | Admitting: Emergency Medicine

## 2017-01-20 DIAGNOSIS — Z79899 Other long term (current) drug therapy: Secondary | ICD-10-CM | POA: Insufficient documentation

## 2017-01-20 DIAGNOSIS — M79645 Pain in left finger(s): Secondary | ICD-10-CM | POA: Diagnosis not present

## 2017-01-20 DIAGNOSIS — S60461A Insect bite (nonvenomous) of left index finger, initial encounter: Secondary | ICD-10-CM | POA: Diagnosis present

## 2017-01-20 DIAGNOSIS — S80869A Insect bite (nonvenomous), unspecified lower leg, initial encounter: Secondary | ICD-10-CM | POA: Diagnosis not present

## 2017-01-20 DIAGNOSIS — Z91030 Bee allergy status: Secondary | ICD-10-CM | POA: Insufficient documentation

## 2017-01-20 DIAGNOSIS — T63441A Toxic effect of venom of bees, accidental (unintentional), initial encounter: Secondary | ICD-10-CM | POA: Insufficient documentation

## 2017-01-20 DIAGNOSIS — I10 Essential (primary) hypertension: Secondary | ICD-10-CM | POA: Diagnosis not present

## 2017-01-20 DIAGNOSIS — Z87891 Personal history of nicotine dependence: Secondary | ICD-10-CM | POA: Insufficient documentation

## 2017-01-20 DIAGNOSIS — Z7982 Long term (current) use of aspirin: Secondary | ICD-10-CM | POA: Diagnosis not present

## 2017-01-20 DIAGNOSIS — I2510 Atherosclerotic heart disease of native coronary artery without angina pectoris: Secondary | ICD-10-CM | POA: Insufficient documentation

## 2017-01-20 DIAGNOSIS — Y929 Unspecified place or not applicable: Secondary | ICD-10-CM | POA: Insufficient documentation

## 2017-01-20 DIAGNOSIS — Y939 Activity, unspecified: Secondary | ICD-10-CM | POA: Diagnosis not present

## 2017-01-20 DIAGNOSIS — T63484A Toxic effect of venom of other arthropod, undetermined, initial encounter: Secondary | ICD-10-CM | POA: Diagnosis not present

## 2017-01-20 DIAGNOSIS — X58XXXA Exposure to other specified factors, initial encounter: Secondary | ICD-10-CM | POA: Insufficient documentation

## 2017-01-20 DIAGNOSIS — Y999 Unspecified external cause status: Secondary | ICD-10-CM | POA: Diagnosis not present

## 2017-01-20 DIAGNOSIS — T63461A Toxic effect of venom of wasps, accidental (unintentional), initial encounter: Secondary | ICD-10-CM | POA: Diagnosis not present

## 2017-01-20 DIAGNOSIS — T7840XA Allergy, unspecified, initial encounter: Secondary | ICD-10-CM

## 2017-01-20 MED ORDER — FAMOTIDINE IN NACL 20-0.9 MG/50ML-% IV SOLN
20.0000 mg | Freq: Once | INTRAVENOUS | Status: AC
  Administered 2017-01-20: 20 mg via INTRAVENOUS
  Filled 2017-01-20: qty 50

## 2017-01-20 MED ORDER — EPINEPHRINE 0.3 MG/0.3ML IJ SOAJ: 0.3000 mg | Freq: Once | INTRAMUSCULAR | 0 refills | Status: AC

## 2017-01-20 MED ORDER — PREDNISONE 20 MG PO TABS: 40.0000 mg | ORAL_TABLET | Freq: Every day | ORAL | 0 refills | Status: AC

## 2017-01-20 NOTE — ED Notes (Signed)
IV removed from left wrist

## 2017-01-20 NOTE — ED Triage Notes (Signed)
Per EMS, patient from [facility], c/o allergic reaction. Patient reports one bee sting to right index finger this morning. Localized swelling to finger. Hx anaphylactic reaction. Denies SOB. A&Ox4.  Patient has received 50mg  Benadryl PTA, epi 0.3 IMx1, 4mg  decadron IM, and 80mg  Solumedrol IM.  20g L hand  BP 108/74 HR 68 RR 16 O2 98%

## 2017-01-20 NOTE — ED Provider Notes (Signed)
[place] DEPT Provider Note   CSN: [id number] Arrival date & time: 01/20/17  1346     History   Chief Complaint Chief Complaint  Patient presents with  . Allergic Reaction    HPI Juan Benjamin is a 83 y.o. male.  HPI Juan Benjamin is a 83 y.o. male presents to emergency department with complaint of allergic reaction. 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 He states he was outside when he got stung on the left index finger by a bee -like insect. She states his friend took him to his family doctor where EMS was called. He did take 50 mg of Benadryl prior to going to the doctor. He received 1 epinephrine 0.3 mg SQ, , 4 mg of Decadron IM, 60 mg of Solu-Medrol IM. Patient states he is feeling much better, states "I feel like a new man." He does report swelling to the finger, however denies any rashes, hives, denies any swelling of the lips, tongue, throat. Denies any difficulty breathing.  Past Medical History:  Diagnosis Date  . Benign tumor    gland hyperplasia  . Carotid artery occlusion    Chronic occlusion of left, R CEA  . Coronary artery disease   . Hyperlipidemia   . Hypertension   . Hypothyroidism   . Prostate cancer [facility])     Patient Active Problem List   Diagnosis Date Noted  . Murmur 12/13/2010  . Coronary artery disease   . Hypertension   . Carotid artery occlusion   . Hyperlipidemia     Past Surgical History:  Procedure Laterality Date  . CAROTID ARTERY SURGERY     Right   . CORONARY ARTERY BYPASS GRAFT     CABG (4 vessel, 2000.  No report in [place])  . RENAL ARTERY STENT  07/02/1999   RIGHT SIDE       Home Medications    Prior to Admission medications   Medication Sig Start Date End Date Taking? Authorizing Provider  acetaminophen (TYLENOL) 325 MG tablet Take 650 mg by mouth every 6 (six) hours as needed (ear pain).   Yes [provider]  allopurinol (ZYLOPRIM) 300 MG tablet Take 300 mg by mouth daily.     Yes  [provider]  amLODipine (NORVASC) 10 MG tablet Take 10 mg by mouth daily. 12/16/16  Yes [provider]  aspirin EC 81 MG tablet Take 1 tablet by mouth daily.   Yes [provider]  benazepril (LOTENSIN) 40 MG tablet Take 20 mg by mouth daily.     Yes [provider]  ibuprofen (ADVIL,MOTRIN) 200 MG tablet Take 200 mg by mouth every 6 (six) hours as needed (ear pain).   Yes [provider]  levothyroxine (SYNTHROID, LEVOTHROID) 75 MCG tablet Take 75 mcg by mouth daily.     Yes [provider]  propranolol (INDERAL) 40 MG tablet Take 40 mg by mouth daily.     Yes [provider]  rosuvastatin (CRESTOR) 10 MG tablet Take 5 mg by mouth daily.     Yes [provider]  predniSONE (DELTASONE) 20 MG tablet 3 tabs po day one, then 2 po daily x 4 days Patient not taking: Reported on 01/20/2017 11/28/16   Palumbo, April, MD  ranitidine (ZANTAC) 150 MG tablet Take 1 tablet (150 mg total) by mouth 2 (two) times daily. Patient not taking: Reported on 01/20/2017 11/28/16   Veatrice Kells, MD    Family History Family History  Problem Relation Age  of Onset  . Bone cancer Mother     Social History Social History  Substance Use Topics  . Smoking status: Former Smoker    Quit date: 12/12/1980  . Smokeless tobacco: Never Used  . Alcohol use No     Allergies   Bee venom; Indomethacin; Other; and Rofecoxib   Review of Systems Review of Systems  Constitutional: Negative for chills and fever.  Respiratory: Negative for cough, chest tightness and shortness of breath.   Cardiovascular: Negative for chest pain, palpitations and leg swelling.  Gastrointestinal: Negative for abdominal distention, abdominal pain, diarrhea, nausea and vomiting.  Genitourinary: Negative for dysuria, frequency, hematuria and urgency.  Musculoskeletal: Negative for arthralgias, myalgias, neck pain and neck stiffness.  Skin: Positive for rash.    Allergic/Immunologic: Negative for immunocompromised state.  Neurological: Negative for dizziness, weakness, light-headedness, numbness and headaches.  All other systems reviewed and are negative.    Physical Exam Updated Vital Signs BP 103/62   Pulse 84   Temp 97.6 F (36.4 C) (Oral)   Resp 16   SpO2 93%   Physical Exam  Constitutional: He appears well-developed and well-nourished. No distress.  HENT:  Head: Normocephalic and atraumatic.  Oropharynx normal, uvula is midline, nonedematous.  Eyes: Conjunctivae are normal.  Neck: Neck supple.  Cardiovascular: Normal rate, regular rhythm and normal heart sounds.   Pulmonary/Chest: Effort normal and breath sounds normal. No respiratory distress. He has no wheezes. He has no rales.  No stridor  Abdominal: Soft. Bowel sounds are normal. He exhibits no distension. There is no tenderness. There is no rebound.  Musculoskeletal: He exhibits no edema.  Mild swelling to the left index finger. No stinger present  Neurological: He is alert.  Skin: Skin is warm and dry.  No hives or rashes  Nursing note and vitals reviewed.    ED Treatments / Results  Labs (all labs ordered are listed, but only abnormal results are displayed) Labs Reviewed - No data to display  EKG  EKG Interpretation None       Radiology No results found.  Procedures Procedures (including critical care time)  Medications Ordered in ED Medications  famotidine (PEPCID) IVPB 20 mg premix (not administered)     Initial Impression / Assessment and Plan / ED Course  I have reviewed the triage vital signs and the nursing notes.  Pertinent labs & imaging results that were available during my care of the patient were reviewed by me and considered in my medical decision making (see chart for details).     2:39 PM  patient seen and examined. Patient in emergency department with allergic reaction. He received epinephrine, steroids. Will add pepcid and  monitor.   3:56 PM  Patient continues to feel well, no complaints. Will monitor for 1 more hour, recheck at 5 PM, If no symptoms, discharge home with EpiPen and steroids.  Pt signed out at shift change pending reassesment.   Final Clinical Impressions(s) / ED Diagnoses   Final diagnoses:  None    New Prescriptions New Prescriptions   No medications on file     Jeannett Senior, Hershal Coria 01/22/17 0153    Drenda Freeze, MD 01/22/17 [phone]

## 2017-01-20 NOTE — ED Provider Notes (Signed)
  Physical Exam  BP 110/60   Pulse 82   Temp 97.6 F (36.4 C) (Oral)   Resp (!) 22   SpO2 92%   Physical Exam  ED Course  Procedures  3:55 PM- Sign out from Jeannett Senior, PA-C  Per previous provider HPI: EMS brought in suspect allergic reaction. Stung by bee. Given epi by EMS.  Observe until 1700  5:06 PM- Observed without acute change. Given Rx Prednisone and Epi Pen. Will DC home with follow up to PCP. At time of discharge, Patient is in no acute distress. Vital Signs are stable. Patient is able to ambulate. Patient able to tolerate PO.             Shary Decamp, PA-C 01/20/17 1706    Drenda Freeze, MD 01/21/17 [phone]

## 2017-01-20 NOTE — ED Notes (Signed)
Family at bedside. 

## 2017-01-20 NOTE — ED Notes (Signed)
ED Provider at bedside. 

## 2017-02-28 DIAGNOSIS — Z23 Encounter for immunization: Secondary | ICD-10-CM | POA: Diagnosis not present

## 2017-03-17 ENCOUNTER — Other Ambulatory Visit: Payer: Self-pay | Admitting: *Deleted

## 2017-03-17 DIAGNOSIS — I6523 Occlusion and stenosis of bilateral carotid arteries: Secondary | ICD-10-CM

## 2017-07-07 DIAGNOSIS — E7800 Pure hypercholesterolemia, unspecified: Secondary | ICD-10-CM | POA: Diagnosis not present

## 2017-07-07 DIAGNOSIS — M109 Gout, unspecified: Secondary | ICD-10-CM | POA: Diagnosis not present

## 2017-07-07 DIAGNOSIS — E039 Hypothyroidism, unspecified: Secondary | ICD-10-CM | POA: Diagnosis not present

## 2017-07-07 DIAGNOSIS — I10 Essential (primary) hypertension: Secondary | ICD-10-CM | POA: Diagnosis not present

## 2017-07-07 DIAGNOSIS — Z125 Encounter for screening for malignant neoplasm of prostate: Secondary | ICD-10-CM | POA: Diagnosis not present

## 2017-07-08 DIAGNOSIS — E039 Hypothyroidism, unspecified: Secondary | ICD-10-CM | POA: Diagnosis not present

## 2017-07-08 DIAGNOSIS — M109 Gout, unspecified: Secondary | ICD-10-CM | POA: Diagnosis not present

## 2017-07-08 DIAGNOSIS — E7800 Pure hypercholesterolemia, unspecified: Secondary | ICD-10-CM | POA: Diagnosis not present

## 2017-07-08 DIAGNOSIS — I10 Essential (primary) hypertension: Secondary | ICD-10-CM | POA: Diagnosis not present

## 2017-09-08 DIAGNOSIS — H04123 Dry eye syndrome of bilateral lacrimal glands: Secondary | ICD-10-CM | POA: Diagnosis not present

## 2017-09-08 DIAGNOSIS — Z961 Presence of intraocular lens: Secondary | ICD-10-CM | POA: Diagnosis not present

## 2017-09-29 ENCOUNTER — Inpatient Hospital Stay (HOSPITAL_COMMUNITY): Admission: RE | Admit: 2017-09-29 | Payer: PPO | Source: Ambulatory Visit

## 2017-10-28 ENCOUNTER — Ambulatory Visit (HOSPITAL_COMMUNITY)
Admission: RE | Admit: 2017-10-28 | Discharge: 2017-10-28 | Disposition: A | Discharge status: Home / Self Care | Payer: PPO | Source: Ambulatory Visit | Attending: Cardiovascular Disease | Admitting: Cardiovascular Disease

## 2017-10-28 DIAGNOSIS — I6523 Occlusion and stenosis of bilateral carotid arteries: Secondary | ICD-10-CM | POA: Insufficient documentation

## 2018-03-05 DIAGNOSIS — Z23 Encounter for immunization: Secondary | ICD-10-CM | POA: Diagnosis not present

## 2018-10-29 DIAGNOSIS — M109 Gout, unspecified: Secondary | ICD-10-CM | POA: Diagnosis not present

## 2018-10-29 DIAGNOSIS — E039 Hypothyroidism, unspecified: Secondary | ICD-10-CM | POA: Diagnosis not present

## 2018-10-29 DIAGNOSIS — I6522 Occlusion and stenosis of left carotid artery: Secondary | ICD-10-CM | POA: Diagnosis not present

## 2018-10-29 DIAGNOSIS — I10 Essential (primary) hypertension: Secondary | ICD-10-CM | POA: Diagnosis not present

## 2018-10-29 DIAGNOSIS — E7800 Pure hypercholesterolemia, unspecified: Secondary | ICD-10-CM | POA: Diagnosis not present

## 2018-10-30 DIAGNOSIS — H524 Presbyopia: Secondary | ICD-10-CM | POA: Diagnosis not present

## 2018-10-30 DIAGNOSIS — H04123 Dry eye syndrome of bilateral lacrimal glands: Secondary | ICD-10-CM | POA: Diagnosis not present

## 2018-10-30 DIAGNOSIS — Z961 Presence of intraocular lens: Secondary | ICD-10-CM | POA: Diagnosis not present

## 2019-02-12 DIAGNOSIS — Z23 Encounter for immunization: Secondary | ICD-10-CM | POA: Diagnosis not present

## 2019-11-03 ENCOUNTER — Other Ambulatory Visit: Payer: Self-pay | Admitting: General Surgery

## 2019-11-03 DIAGNOSIS — Z85828 Personal history of other malignant neoplasm of skin: Secondary | ICD-10-CM | POA: Diagnosis not present

## 2019-11-03 DIAGNOSIS — D0362 Melanoma in situ of left upper limb, including shoulder: Secondary | ICD-10-CM | POA: Diagnosis not present

## 2019-11-03 DIAGNOSIS — L570 Actinic keratosis: Secondary | ICD-10-CM | POA: Diagnosis not present

## 2019-11-03 DIAGNOSIS — C44612 Basal cell carcinoma of skin of right upper limb, including shoulder: Secondary | ICD-10-CM | POA: Diagnosis not present

## 2019-11-03 DIAGNOSIS — L821 Other seborrheic keratosis: Secondary | ICD-10-CM | POA: Diagnosis not present

## 2019-11-03 DIAGNOSIS — C44319 Basal cell carcinoma of skin of other parts of face: Secondary | ICD-10-CM | POA: Diagnosis not present

## 2019-11-03 DIAGNOSIS — C44519 Basal cell carcinoma of skin of other part of trunk: Secondary | ICD-10-CM | POA: Diagnosis not present

## 2019-11-03 DIAGNOSIS — D225 Melanocytic nevi of trunk: Secondary | ICD-10-CM | POA: Diagnosis not present

## 2019-11-03 DIAGNOSIS — D485 Neoplasm of uncertain behavior of skin: Secondary | ICD-10-CM | POA: Diagnosis not present

## 2019-11-03 DIAGNOSIS — D1801 Hemangioma of skin and subcutaneous tissue: Secondary | ICD-10-CM | POA: Diagnosis not present

## 2019-11-03 DIAGNOSIS — L905 Scar conditions and fibrosis of skin: Secondary | ICD-10-CM | POA: Diagnosis not present

## 2019-11-03 DIAGNOSIS — Z85820 Personal history of malignant melanoma of skin: Secondary | ICD-10-CM | POA: Diagnosis not present

## 2019-12-09 DIAGNOSIS — H524 Presbyopia: Secondary | ICD-10-CM | POA: Diagnosis not present

## 2019-12-09 DIAGNOSIS — H5203 Hypermetropia, bilateral: Secondary | ICD-10-CM | POA: Diagnosis not present

## 2019-12-09 DIAGNOSIS — Z961 Presence of intraocular lens: Secondary | ICD-10-CM | POA: Diagnosis not present

## 2019-12-16 ENCOUNTER — Emergency Department (HOSPITAL_COMMUNITY)
Admission: EM | Admit: 2019-12-16 | Discharge: 2019-12-16 | Disposition: A | Discharge status: Home / Self Care | Payer: PPO | Attending: Emergency Medicine | Admitting: Emergency Medicine

## 2019-12-16 ENCOUNTER — Other Ambulatory Visit: Payer: Self-pay

## 2019-12-16 ENCOUNTER — Encounter (HOSPITAL_COMMUNITY): Payer: Self-pay | Admitting: Emergency Medicine

## 2019-12-16 DIAGNOSIS — T7840XA Allergy, unspecified, initial encounter: Secondary | ICD-10-CM | POA: Diagnosis not present

## 2019-12-16 DIAGNOSIS — Z87891 Personal history of nicotine dependence: Secondary | ICD-10-CM | POA: Insufficient documentation

## 2019-12-16 DIAGNOSIS — T63441A Toxic effect of venom of bees, accidental (unintentional), initial encounter: Secondary | ICD-10-CM | POA: Insufficient documentation

## 2019-12-16 DIAGNOSIS — Z91030 Bee allergy status: Secondary | ICD-10-CM | POA: Insufficient documentation

## 2019-12-16 DIAGNOSIS — R000 Tachycardia, unspecified: Secondary | ICD-10-CM | POA: Diagnosis not present

## 2019-12-16 DIAGNOSIS — I2510 Atherosclerotic heart disease of native coronary artery without angina pectoris: Secondary | ICD-10-CM | POA: Insufficient documentation

## 2019-12-16 DIAGNOSIS — E039 Hypothyroidism, unspecified: Secondary | ICD-10-CM | POA: Insufficient documentation

## 2019-12-16 DIAGNOSIS — Z79899 Other long term (current) drug therapy: Secondary | ICD-10-CM | POA: Diagnosis not present

## 2019-12-16 DIAGNOSIS — Z7982 Long term (current) use of aspirin: Secondary | ICD-10-CM | POA: Insufficient documentation

## 2019-12-16 DIAGNOSIS — T782XXA Anaphylactic shock, unspecified, initial encounter: Secondary | ICD-10-CM | POA: Insufficient documentation

## 2019-12-16 DIAGNOSIS — I959 Hypotension, unspecified: Secondary | ICD-10-CM | POA: Diagnosis not present

## 2019-12-16 DIAGNOSIS — R0902 Hypoxemia: Secondary | ICD-10-CM | POA: Diagnosis not present

## 2019-12-16 DIAGNOSIS — I119 Hypertensive heart disease without heart failure: Secondary | ICD-10-CM | POA: Insufficient documentation

## 2019-12-16 DIAGNOSIS — R0602 Shortness of breath: Secondary | ICD-10-CM | POA: Diagnosis not present

## 2019-12-16 MED ORDER — FAMOTIDINE IN NACL 20-0.9 MG/50ML-% IV SOLN
20.0000 mg | Freq: Once | INTRAVENOUS | Status: AC
  Administered 2019-12-16: 20 mg via INTRAVENOUS
  Filled 2019-12-16: qty 50

## 2019-12-16 MED ORDER — EPINEPHRINE 0.3 MG/0.3ML IJ SOAJ: 0.3000 mg | INTRAMUSCULAR | 0 refills | Status: DC | PRN

## 2019-12-16 MED ORDER — DIPHENHYDRAMINE HCL 50 MG/ML IJ SOLN
25.0000 mg | Freq: Once | INTRAMUSCULAR | Status: AC
  Administered 2019-12-16: 25 mg via INTRAVENOUS
  Filled 2019-12-16: qty 1

## 2019-12-16 MED ORDER — PREDNISONE 20 MG PO TABS: 60.0000 mg | ORAL_TABLET | Freq: Every day | ORAL | 0 refills | Status: DC

## 2019-12-16 MED ORDER — SODIUM CHLORIDE 0.9 % IV BOLUS
500.0000 mL | Freq: Once | INTRAVENOUS | Status: AC
  Administered 2019-12-16: 500 mL via INTRAVENOUS

## 2019-12-16 MED ORDER — ACETAMINOPHEN 325 MG PO TABS
650.0000 mg | ORAL_TABLET | Freq: Once | ORAL | Status: AC
  Administered 2019-12-16: 650 mg via ORAL
  Filled 2019-12-16: qty 2

## 2019-12-16 MED ORDER — ALBUTEROL SULFATE HFA 108 (90 BASE) MCG/ACT IN AERS
2.0000 | INHALATION_SPRAY | Freq: Once | RESPIRATORY_TRACT | Status: AC
  Administered 2019-12-16: 2 via RESPIRATORY_TRACT
  Filled 2019-12-16: qty 6.7

## 2019-12-16 NOTE — ED Provider Notes (Signed)
[facility] DEPT Provider Note   CSN: [id number] Arrival date & time: 12/16/19  1250     History Chief Complaint  Patient presents with  . Allergic Reaction    Juan Benjamin is a 86 y.o. male.  The history is provided by the patient and the EMS personnel. No language interpreter was used.  Allergic Reaction    86 year old male with multiple allergies including bee venom, and rofecoxib brought here via EMS for evaluation of low reaction to insect bite.  Approximately 2 hours ago patient was weed eating outside of his house when he was stung multiple times by yellow jackets.  He was stung in his right hand and wrist as well as his left ankle.  Shortly afterward, he developed shortness of breath.  He took 25 mg of Benadryl and went to urgent care.  While at urgent care, patient received an epi pen injection as well as 1 from 25 mg of Solu-Medrol.  When EMS arrived, they noted that his blood pressure was low with a systolic in the 85O.  Patient received 500 mL of IV fluid and blood pressure improved.  At this time patient felt better.  He endorsed some mild shortness of breath but denies lightheadedness or dizziness denies any chest pain, tongue swelling, abdominal cramping or hives.  Denies history of tobacco use, denies history of COPD or asthma.  Past Medical History:  Diagnosis Date  . Benign tumor    gland hyperplasia  . Carotid artery occlusion    Chronic occlusion of left, R CEA  . Coronary artery disease   . Hyperlipidemia   . Hypertension   . Hypothyroidism   . Prostate cancer [facility])     Patient Active Problem List   Diagnosis Date Noted  . Murmur 12/13/2010  . Coronary artery disease   . Hypertension   . Carotid artery occlusion   . Hyperlipidemia     Past Surgical History:  Procedure Laterality Date  . CAROTID ARTERY SURGERY     Right   . CORONARY ARTERY BYPASS GRAFT     CABG (4 vessel, 2000.  No report in [place])  . RENAL  ARTERY STENT  07/02/1999   RIGHT SIDE       Family History  Problem Relation Age of Onset  . Bone cancer Mother     Social History   Tobacco Use  . Smoking status: Former Smoker    Quit date: 12/12/1980    Years since quitting: 39.0  . Smokeless tobacco: Never Used  Substance Use Topics  . Alcohol use: No  . Drug use: No    Home Medications Prior to Admission medications   Medication Sig Start Date End Date Taking? Authorizing Provider  acetaminophen (TYLENOL) 325 MG tablet Take 650 mg by mouth every 6 (six) hours as needed (ear pain).    [provider]  allopurinol (ZYLOPRIM) 300 MG tablet Take 300 mg by mouth daily.      [provider]  amLODipine (NORVASC) 10 MG tablet Take 10 mg by mouth daily. 12/16/16   [provider]  aspirin EC 81 MG tablet Take 1 tablet by mouth daily.    [provider]  benazepril (LOTENSIN) 40 MG tablet Take 20 mg by mouth daily.      [provider]  ibuprofen (ADVIL,MOTRIN) 200 MG tablet Take 200 mg by mouth every 6 (six) hours as needed (ear pain).    [provider]  levothyroxine ([place], [place]) 75  MCG tablet Take 75 mcg by mouth daily.      [provider]  propranolol (INDERAL) 40 MG tablet Take 40 mg by mouth daily.      [provider]  ranitidine (ZANTAC) 150 MG tablet Take 1 tablet (150 mg total) by mouth 2 (two) times daily. Patient not taking: Reported on 01/20/2017 11/28/16   Palumbo, April, MD  rosuvastatin (CRESTOR) 10 MG tablet Take 5 mg by mouth daily.      [provider]    Allergies    Bee venom, Indomethacin, Other, and Rofecoxib  Review of Systems   Review of Systems  All other systems reviewed and are negative.   Physical Exam Updated Vital Signs BP 125/66 (BP Location: Right Arm)   Temp 98.8 F (37.1 C) (Oral)   Resp 16   Physical Exam Vitals and nursing note reviewed.  Constitutional:      General: He is not in acute  distress.    Appearance: He is well-developed.  HENT:     Head: Atraumatic.     Mouth/Throat:     Comments: No tongue edema, no stridor and no mucosal edema. Eyes:     Conjunctiva/sclera: Conjunctivae normal.  Cardiovascular:     Rate and Rhythm: Normal rate and regular rhythm.     Heart sounds: Murmur heard.   Pulmonary:     Breath sounds: Wheezing present.     Comments: Mildly tachypneic with expiratory wheezes heard Abdominal:     Palpations: Abdomen is soft.     Tenderness: There is no abdominal tenderness.  Musculoskeletal:        General: No swelling.     Cervical back: Neck supple.  Skin:    Comments: Localized skin irritation noted to the dorsum of right hand and breast as well as the medial aspect of left ankle  Neurological:     Mental Status: He is alert and oriented to person, place, and time.  Psychiatric:        Mood and Affect: Mood normal.     ED Results / Procedures / Treatments   Labs (all labs ordered are listed, but only abnormal results are displayed) Labs Reviewed - No data to display  EKG None  Radiology No results found.  Procedures Procedures (including critical care time)  Medications Ordered in ED Medications  albuterol (VENTOLIN HFA) 108 (90 Base) MCG/ACT inhaler 2 puff (2 puffs Inhalation Given 12/16/19 1349)  sodium chloride 0.9 % bolus 500 mL (0 mLs Intravenous Stopped 12/16/19 1428)  famotidine (PEPCID) IVPB 20 mg premix (0 mg Intravenous Stopped 12/16/19 1435)  diphenhydrAMINE (BENADRYL) injection 25 mg (25 mg Intravenous Given 12/16/19 1525)  acetaminophen (TYLENOL) tablet 650 mg (650 mg Oral Given 12/16/19 1524)    ED Course  I have reviewed the triage vital signs and the nursing notes.  Pertinent labs & imaging results that were available during my care of the patient were reviewed by me and considered in my medical decision making (see chart for details).    MDM Rules/Calculators/A&P                          BP (!) 115/92  (BP Location: Right Arm)   Pulse 71   Temp 98.8 F (37.1 C) (Oral)   Resp 16   Ht 5\' 7"  (1.702 m)   Wt 63.5 kg   SpO2 94%   BMI 21.93 kg/m   Final Clinical Impression(s) / ED Diagnoses Final  diagnoses:  Anaphylaxis, initial encounter    Rx / DC Orders ED Discharge Orders         Ordered    predniSONE (DELTASONE) 20 MG tablet  Daily     Discontinue  Reprint     12/16/19 1612    EPINEPHrine 0.3 mg/0.3 mL IJ SOAJ injection  As needed     Discontinue  Reprint     12/16/19 1612         1:36 PM Patient was stung multiple times by yellow jackets approximately 2 to 3 hours ago.  Did endorse shortness of breath, and is actively wheezing.  He did receive EpiPen at urgent care center as well as after 25 mg of Solu-Medrol and he took 25 mg of Benadryl prior to arrival.  He does not have any urticaria, however he was found to be hypotensive with systolic blood pressure in the 80s initially by EMS.  He will need to be monitored closely for the next several hours, will give albuterol inhaler, additional IV fluid, Pepcid, and will place patient on cardiac monitor.  4:16 PM Patient has been monitored in the ED for the next past 3 hours and he is doing well, breathing has significantly improved, wheezing has resolved.  At this time he is stable for discharge.  Patient discharged home with prednisone, he will take over-the-counter Benadryl as needed.  EpiPen also prescribed with appropriate instruction on its usage.  Return precaution discussed.  Care discussed with Dr. Roderic Palau.    Domenic Moras, PA-C 12/16/19 1617    Milton Ferguson, MD 12/19/19 [phone]

## 2019-12-16 NOTE — Discharge Instructions (Addendum)
You have been evaluated for your recent bee sting.  Please take prednisone as prescribed for tomorrow.  Use over-the-counter Benadryl as needed for itchiness.  Use an EpiPen if you get stung again and come to the ER promptly for further management.

## 2019-12-16 NOTE — ED Triage Notes (Signed)
Patient BIBA from home, reporting multiple yellow jacket stings. Took 25 mg of Benadryl w/ no relief. Given .3 1:1 epi and 120 mg solu-medrol   500 cc NS 20 G IV RAC    BP 136/90 P 76 SpO2 94% 3 L [place]

## 2019-12-21 ENCOUNTER — Telehealth: Payer: Self-pay | Admitting: Cardiology

## 2019-12-21 DIAGNOSIS — H9193 Unspecified hearing loss, bilateral: Secondary | ICD-10-CM | POA: Diagnosis not present

## 2019-12-21 DIAGNOSIS — E039 Hypothyroidism, unspecified: Secondary | ICD-10-CM | POA: Diagnosis not present

## 2019-12-21 DIAGNOSIS — E7800 Pure hypercholesterolemia, unspecified: Secondary | ICD-10-CM | POA: Diagnosis not present

## 2019-12-21 DIAGNOSIS — M109 Gout, unspecified: Secondary | ICD-10-CM | POA: Diagnosis not present

## 2019-12-21 DIAGNOSIS — I10 Essential (primary) hypertension: Secondary | ICD-10-CM | POA: Diagnosis not present

## 2019-12-21 DIAGNOSIS — I2510 Atherosclerotic heart disease of native coronary artery without angina pectoris: Secondary | ICD-10-CM | POA: Diagnosis not present

## 2019-12-21 DIAGNOSIS — R5383 Other fatigue: Secondary | ICD-10-CM | POA: Diagnosis not present

## 2019-12-21 NOTE — Telephone Encounter (Signed)
Patients daughter is calliing an stated that he was seen by Dr. Percival Spanish in 2019 but I only see when he was seen in 2018. Please call to confirm

## 2019-12-21 NOTE — Telephone Encounter (Signed)
Spoke with the patient's niece. She stated that the patient's PCP would like for the patient to have a follow up with Dr. Percival Spanish. An appointment has been made for 02/24/20.

## 2019-12-24 DIAGNOSIS — R5383 Other fatigue: Secondary | ICD-10-CM | POA: Diagnosis not present

## 2019-12-24 DIAGNOSIS — H903 Sensorineural hearing loss, bilateral: Secondary | ICD-10-CM | POA: Diagnosis not present

## 2019-12-24 DIAGNOSIS — M109 Gout, unspecified: Secondary | ICD-10-CM | POA: Diagnosis not present

## 2019-12-24 DIAGNOSIS — E039 Hypothyroidism, unspecified: Secondary | ICD-10-CM | POA: Diagnosis not present

## 2019-12-24 DIAGNOSIS — I10 Essential (primary) hypertension: Secondary | ICD-10-CM | POA: Diagnosis not present

## 2019-12-24 DIAGNOSIS — E7800 Pure hypercholesterolemia, unspecified: Secondary | ICD-10-CM | POA: Diagnosis not present

## 2020-01-06 DIAGNOSIS — C44319 Basal cell carcinoma of skin of other parts of face: Secondary | ICD-10-CM | POA: Diagnosis not present

## 2020-01-07 ENCOUNTER — Other Ambulatory Visit: Payer: Self-pay | Admitting: General Surgery

## 2020-01-07 DIAGNOSIS — C4362 Malignant melanoma of left upper limb, including shoulder: Secondary | ICD-10-CM | POA: Diagnosis not present

## 2020-01-07 DIAGNOSIS — I252 Old myocardial infarction: Secondary | ICD-10-CM | POA: Diagnosis not present

## 2020-01-10 ENCOUNTER — Telehealth: Payer: Self-pay

## 2020-01-10 NOTE — Telephone Encounter (Signed)
   [facility] Pre-operative Risk Assessment    HEARTCARE STAFF: - Please ensure there is not already an duplicate clearance open for this procedure. - Under Visit Info/Reason for Call, type in Other and utilize the format Clearance MM/DD/YY or Clearance TBD. Do not use dashes or single digits. - If request is for dental extraction, please clarify the # of teeth to be extracted.  Request for surgical clearance:  1. What type of surgery is being performed? Melanoma Excision of Upper Left arm  2. When is this surgery scheduled? TBD  3. What type of clearance is required (medical clearance vs. Pharmacy clearance to hold med vs. Both)? BOTH  4. Are there any medications that need to be held prior to surgery and how long? ASA for 5 days prior to surgery  5. Practice name and name of physician performing surgery? [facility], [place], Dr. Stark Klein   6. What is the office phone number? [phone]   7.   What is the office fax number? [phone] Attn: April Staton, CMA  8.   Anesthesia type (None, local, MAC, general) ? General   Jacqulynn Cadet 01/10/2020, 5:06 PM  _________________________________________________________________   (provider comments below)

## 2020-01-11 NOTE — Telephone Encounter (Signed)
   Primary Cardiologist:James Hochrein, MD  Chart reviewed as part of pre-operative protocol coverage. Because of Juan Benjamin's past medical history and time since last visit (last seen in 2018), they will require a follow-up visit in order to better assess preoperative cardiovascular risk.  Pre-op covering staff: - Please schedule appointment and call patient to inform them.  - Pt has appt with Dr. Percival Spanish 02/24/2020.  We should try to get him in sooner since surgery is for melanoma excision. - Please add "pre-op clearance" to the appointment notes so provider is aware. - Please contact requesting surgeon's office via preferred method (i.e, phone, fax) to inform them of need for appointment prior to surgery.  [name], PA-C  01/11/2020, 4:41 PM

## 2020-01-11 NOTE — Telephone Encounter (Signed)
Spoke with patient and he would like to take some time to think over what he is going to do. Patient opted to leave visit as scheduled 9/30, will call back if wants to move up. He did have several questions about the procedure that I could not answer. Advised patient to call Dr Marlowe Aschoff office to discuss or possibly even make an appointment.

## 2020-01-11 NOTE — Telephone Encounter (Signed)
-----   Message from Minus Breeding, MD sent at 01/07/2020  1:58 PM EDT ----- Unfortunately, I have not seen this patient in over 3 years so could not clear him without being seen.  (Technically a new patient now.)  We will get him.  When is surgery.  Jake ----- Message ----- From: Stark Klein, MD Sent: 01/07/2020  10:36 AM EDT To: Minus Breeding, MD  Dr. 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  Do you think he needs any risk stratification testing?  Also, can I hold his ASA?  He has an appointment with you 9/9.  Is there any way that could get moved up if you feel that you need to see him first to make those decisions?    Thanks for your time. Stark Klein, MD Surgical oncology

## 2020-01-12 NOTE — Telephone Encounter (Signed)
Called the requesting office to inform them the was last seen in 2018 and is schedule to see Dr. Percival Spanish on 02/24/2020 and how the the patient when speaking with the nurse for Dr. Percival Spanish that he had some questions about the procedure and was advised to call their office to discuss or set up an appointment.

## 2020-01-20 DIAGNOSIS — C44519 Basal cell carcinoma of skin of other part of trunk: Secondary | ICD-10-CM | POA: Diagnosis not present

## 2020-01-20 DIAGNOSIS — C44612 Basal cell carcinoma of skin of right upper limb, including shoulder: Secondary | ICD-10-CM | POA: Diagnosis not present

## 2020-01-20 DIAGNOSIS — L905 Scar conditions and fibrosis of skin: Secondary | ICD-10-CM | POA: Diagnosis not present

## 2020-02-22 DIAGNOSIS — Z7189 Other specified counseling: Secondary | ICD-10-CM | POA: Insufficient documentation

## 2020-02-22 NOTE — Progress Notes (Addendum)
Cardiology Office Note   Date:  02/24/2020   ID:  Kimm, Ungaro 03-27-1931, MRN [id number]  PCP:  Aletha Halim., PA-C  Cardiologist:   Minus Breeding, MD   Chief Complaint  Patient presents with  . Coronary Artery Disease      History of Present Illness: Juan Benjamin is a 86 y.o. male who presents for evaluation of CAD.  I last saw him in 2018.  He is going to have a melanoma removed.  He is here in part for preop clearance.  COVID has disrupted some of this follow-up.  He is actually been doing well.  He does a lot of yard work including weed eating.  He does not have any chest pressure, neck or arm discomfort.  He does not have any shortness of breath, PND or orthopnea.  No palpitations, presyncope or syncope.  He has no weight gain or edema.   Past Medical History:  Diagnosis Date  . Benign tumor    gland hyperplasia  . Carotid artery occlusion    Chronic occlusion of left, R CEA  . Coronary artery disease   . Hyperlipidemia   . Hypertension   . Hypothyroidism   . Prostate cancer [facility])     Past Surgical History:  Procedure Laterality Date  . CAROTID ARTERY SURGERY     Right   . CORONARY ARTERY BYPASS GRAFT     CABG (4 vessel, 2000.  No report in [place])  . RENAL ARTERY STENT  07/02/1999   RIGHT SIDE     Current Outpatient Medications  Medication Sig Dispense Refill  . acetaminophen (TYLENOL) 325 MG tablet Take 325-650 mg by mouth every 6 (six) hours as needed for moderate pain (ear pain).     Marland Kitchen allopurinol (ZYLOPRIM) 100 MG tablet Take 100 mg by mouth daily.    Marland Kitchen amLODipine (NORVASC) 5 MG tablet Take 5 mg by mouth daily.     Marland Kitchen aspirin 81 MG tablet Take 1 tablet (81 mg total) by mouth daily. 90 tablet 3  . atorvastatin (LIPITOR) 10 MG tablet Take 10 mg by mouth daily.    . benazepril (LOTENSIN) 10 MG tablet Take 10 mg by mouth daily.     Marland Kitchen EPINEPHrine 0.3 mg/0.3 mL IJ SOAJ injection Inject 0.3 mLs (0.3 mg total) into the muscle as needed for  anaphylaxis. 1 each 0  . ibuprofen (ADVIL,MOTRIN) 200 MG tablet Take 200 mg by mouth every 6 (six) hours as needed (ear pain).    Marland Kitchen levothyroxine (SYNTHROID) 88 MCG tablet Take 88 mcg by mouth daily.     . propranolol (INDERAL) 40 MG tablet Take 40 mg by mouth daily.      . rosuvastatin (CRESTOR) 5 MG tablet Take 10 mg by mouth daily.      No current facility-administered medications for this visit.    Allergies:   Wasp venom, Bee venom, Gemfibrozil, Indomethacin, Lisinopril, Other, Rofecoxib, Sildenafil, and Simvastatin    ROS:  Please see the history of present illness.   Otherwise, review of systems are positive for none.   All other systems are reviewed and negative.    PHYSICAL EXAM: VS:  BP (!) 144/72   Pulse (!) 55   Temp 98.2 F (36.8 C)   Ht 5\' 7"  (1.702 m)   Wt 144 lb (65.3 kg)   SpO2 96%   BMI 22.55 kg/m  , BMI Body mass index is 22.55 kg/m. GENERAL:  Well appearing NECK:  No  jugular venous distention, waveform within normal limits, carotid upstroke brisk and symmetric, no bruits, no thyromegaly LYMPHATICS:  No cervical, inguinal adenopathy LUNGS:  Clear to auscultation bilaterally CHEST:  Well healed sternotomy scar. HEART:  PMI not displaced or sustained,S1 and S2 within normal limits, no S3, no S4, no clicks, no rubs, 3 out of 6 apical systolic murmur late peaking, no diastolic murmurs ABD:  Flat, positive bowel sounds normal in frequency in pitch, no bruits, no rebound, no guarding, no midline pulsatile mass, no hepatomegaly, no splenomegaly EXT:  2 plus pulses throughout, no edema, no cyanosis no clubbing   EKG:  EKG is ordered today. The ekg ordered today demonstrates sinus bradycardia, rate 55, old inferior infarct, old anteroseptal infarct, premature atrial contractions, no acute ST-T wave changes.   Recent Labs: No results found for requested labs within last 8760 hours.    Lipid Panel    Component Value Date/Time   CHOL  11/08/2008 0420    129         ATP III CLASSIFICATION:  <200     mg/dL   Desirable  200-239  mg/dL   Borderline High  >=240    mg/dL   High          TRIG 77 11/08/2008 0420   HDL 34 (L) 11/08/2008 0420   CHOLHDL 3.8 11/08/2008 0420   VLDL 15 11/08/2008 0420   LDLCALC  11/08/2008 0420    80        Total Cholesterol/HDL:CHD Risk Coronary Heart Disease Risk Table                     Men   Women  1/2 Average Risk   3.4   3.3  Average Risk       5.0   4.4  2 X Average Risk   9.6   7.1  3 X Average Risk  23.4   11.0        Use the calculated Patient Ratio above and the CHD Risk Table to determine the patient's CHD Risk.        ATP III CLASSIFICATION (LDL):  <100     mg/dL   Optimal  100-129  mg/dL   Near or Above                    Optimal  130-159  mg/dL   Borderline  160-189  mg/dL   High  >190     mg/dL   Very High      Wt Readings from Last 3 Encounters:  02/24/20 144 lb (65.3 kg)  12/16/19 140 lb (63.5 kg)  07/11/16 159 lb 6.4 oz (72.3 kg)      Other studies Reviewed: Additional studies/ records that were reviewed today include: Labs. Review of the above records demonstrates:  Please see elsewhere in the note.     ASSESSMENT AND PLAN:  Coronary artery disease -  Patient has no new symptoms.  No further stress testing is indicated.  We will continue with secondary risk reduction.  Carotid artery occlusion -  He has an occluded left carotid and 0-39% right in the past.   He is overdue for follow up.  I will schedule follow-up.   Hyperlipidemia -  He has not tolerated statins.  He is on on regimen.  He would really like to be off of these as much as possible but would agree to take a lower dose and a PCSK9 inhibitor and I  will refer him to the pharmacy for this.   Murmur -  This was evaluated with echo in 2016.  It was moderate aortic stenosis.     I suspect this is increased compared to previous and I will check an echocardiogram which I would like to see prior to his surgery.   Preop -   Pending his echo I am going to give approval for his melanoma resection.  Its not a high risk surgery.  He has a high functional level.  He has no unstable symptoms.  He would be acceptable risk.  Covid education - He has been vaccinated.    Current medicines are reviewed at length with the patient today.  The patient does not have concerns regarding medicines.  The following changes have been made:  no change  Labs/ tests ordered today include:   Orders Placed This Encounter  Procedures  . AMB Referral to [facility]  . EKG 12-Lead  . ECHOCARDIOGRAM COMPLETE  . VAS US CAROTID     Disposition:   FU with me in one year.     Signed, Minus Breeding, MD  02/24/2020 12:22 PM    [facility]

## 2020-02-24 ENCOUNTER — Other Ambulatory Visit: Payer: Self-pay

## 2020-02-24 ENCOUNTER — Ambulatory Visit (INDEPENDENT_AMBULATORY_CARE_PROVIDER_SITE_OTHER): Payer: PPO | Admitting: Cardiology

## 2020-02-24 ENCOUNTER — Encounter: Payer: Self-pay | Admitting: Cardiology

## 2020-02-24 VITALS — BP 144/72 | HR 55 | Temp 98.2°F | Ht 67.0 in | Wt 144.0 lb

## 2020-02-24 DIAGNOSIS — Z7189 Other specified counseling: Secondary | ICD-10-CM | POA: Diagnosis not present

## 2020-02-24 DIAGNOSIS — E785 Hyperlipidemia, unspecified: Secondary | ICD-10-CM | POA: Diagnosis not present

## 2020-02-24 DIAGNOSIS — R011 Cardiac murmur, unspecified: Secondary | ICD-10-CM | POA: Diagnosis not present

## 2020-02-24 DIAGNOSIS — I2510 Atherosclerotic heart disease of native coronary artery without angina pectoris: Secondary | ICD-10-CM | POA: Diagnosis not present

## 2020-02-24 DIAGNOSIS — I6523 Occlusion and stenosis of bilateral carotid arteries: Secondary | ICD-10-CM

## 2020-02-24 MED ORDER — ASPIRIN EC 81 MG PO TBEC: 81.0000 mg | DELAYED_RELEASE_TABLET | Freq: Every day | ORAL | 3 refills | Status: DC

## 2020-02-24 NOTE — Patient Instructions (Addendum)
Medication Instructions:  DECREASE Aspirin to 81 mg daily  *If you need a refill on your cardiac medications before your next appointment, please call your pharmacy  Testing/Procedures: Your physician has requested that you have an echocardiogram. Echocardiography is a painless test that uses sound waves to create images of your heart. It provides your doctor with information about the size and shape of your heart and how well your heart's chambers and valves are working. This procedure takes approximately one hour. There are no restrictions for this procedure.  This will be done at our [facility] location:  [facility] has requested that you have a carotid duplex. This test is an ultrasound of the carotid arteries in your neck. It looks at blood flow through these arteries that supply the brain with blood. Allow one hour for this exam. There are no restrictions or special instructions.  Follow-Up: At [facility], you and your health needs are our priority.  As part of our continuing mission to provide you with exceptional heart care, we have created designated Provider Care Teams.  These Care Teams include your primary Cardiologist (physician) and Advanced Practice Providers (APPs -  Physician Assistants and Nurse Practitioners) who all work together to provide you with the care you need, when you need it.  We recommend signing up for the patient portal called "MyChart".  Sign up information is provided on this After Visit Summary.  MyChart is used to connect with patients for Virtual Visits (Telemedicine).  Patients are able to view lab/test results, encounter notes, upcoming appointments, etc.  Non-urgent messages can be sent to your provider as well.   To learn more about what you can do with MyChart, go to [email or web address].    Your next appointment:   12 month(s)  The format for your next appointment:   In Person  Provider:   Minus Breeding, MD   Other Instructions You have been referred to: out pharmacist to help manage your cholesterol

## 2020-02-25 ENCOUNTER — Ambulatory Visit (HOSPITAL_COMMUNITY): Payer: PPO | Attending: Cardiology

## 2020-02-25 DIAGNOSIS — R011 Cardiac murmur, unspecified: Secondary | ICD-10-CM

## 2020-02-25 LAB — ECHOCARDIOGRAM COMPLETE
AR max vel: 1.09 cm2
AV Area VTI: 1.08 cm2
AV Area mean vel: 0.95 cm2
AV Mean grad: 23 mmHg
AV Peak grad: 39.9 mmHg
Ao pk vel: 3.16 m/s
Area-P 1/2: 2.26 cm2
P 1/2 time: 472 msec
S' Lateral: 3 cm

## 2020-03-06 DIAGNOSIS — D229 Melanocytic nevi, unspecified: Secondary | ICD-10-CM | POA: Diagnosis not present

## 2020-03-06 DIAGNOSIS — Z85828 Personal history of other malignant neoplasm of skin: Secondary | ICD-10-CM | POA: Diagnosis not present

## 2020-03-06 DIAGNOSIS — D1801 Hemangioma of skin and subcutaneous tissue: Secondary | ICD-10-CM | POA: Diagnosis not present

## 2020-03-06 DIAGNOSIS — L570 Actinic keratosis: Secondary | ICD-10-CM | POA: Diagnosis not present

## 2020-03-06 DIAGNOSIS — D225 Melanocytic nevi of trunk: Secondary | ICD-10-CM | POA: Diagnosis not present

## 2020-03-06 DIAGNOSIS — L304 Erythema intertrigo: Secondary | ICD-10-CM | POA: Diagnosis not present

## 2020-03-06 DIAGNOSIS — Z85820 Personal history of malignant melanoma of skin: Secondary | ICD-10-CM | POA: Diagnosis not present

## 2020-03-06 DIAGNOSIS — L218 Other seborrheic dermatitis: Secondary | ICD-10-CM | POA: Diagnosis not present

## 2020-03-06 DIAGNOSIS — L821 Other seborrheic keratosis: Secondary | ICD-10-CM | POA: Diagnosis not present

## 2020-03-06 DIAGNOSIS — L905 Scar conditions and fibrosis of skin: Secondary | ICD-10-CM | POA: Diagnosis not present

## 2020-03-06 NOTE — Telephone Encounter (Signed)
   Primary Cardiologist: Minus Breeding, MD  Chart reviewed as part of pre-operative protocol coverage. Given past medical history and time since last visit, based on ACC/AHA guidelines, Juan Benjamin would be at acceptable risk for the planned procedure without further cardiovascular testing.   His aspirin may be held for 5 days prior to his procedure.  Please resume as soon as hemostasis is achieved.  I will route this recommendation to the requesting party via Epic fax function and remove from pre-op pool.  Please call with questions.  Jossie Ng. Sherilyn Windhorst NP-C    03/06/2020, 2:11 PM [facility] [address] Office [phone] Fax [phone]

## 2020-03-06 NOTE — Telephone Encounter (Signed)
[name] is calling requesting the surgical clearance be sent to the requesting office due to the patient being seen on 02/24/20 to be cleared. Please advise.

## 2020-03-14 DIAGNOSIS — Z23 Encounter for immunization: Secondary | ICD-10-CM | POA: Diagnosis not present

## 2020-03-14 NOTE — Progress Notes (Signed)
Patient's chart and recent ECHO results reviewed with Dr Elgie Congo, she states that due to hx severe aortic stenosis and carotid stenosis, he will need an arterial line for surgery and will need to be done at [facility]. Abbie at Dr [facility] office notified of need to move pt.

## 2020-03-16 ENCOUNTER — Other Ambulatory Visit: Payer: Self-pay

## 2020-03-16 ENCOUNTER — Ambulatory Visit (INDEPENDENT_AMBULATORY_CARE_PROVIDER_SITE_OTHER): Payer: PPO | Admitting: Pharmacist Clinician (PhC)/ Clinical Pharmacy Specialist

## 2020-03-16 ENCOUNTER — Ambulatory Visit (HOSPITAL_COMMUNITY)
Admission: RE | Admit: 2020-03-16 | Discharge: 2020-03-16 | Disposition: A | Discharge status: Home / Self Care | Payer: PPO | Source: Ambulatory Visit | Attending: Cardiovascular Disease | Admitting: Cardiovascular Disease

## 2020-03-16 DIAGNOSIS — I10 Essential (primary) hypertension: Secondary | ICD-10-CM | POA: Diagnosis not present

## 2020-03-16 DIAGNOSIS — I6523 Occlusion and stenosis of bilateral carotid arteries: Secondary | ICD-10-CM | POA: Diagnosis not present

## 2020-03-16 NOTE — Progress Notes (Signed)
03/17/2020 Juan Benjamin [facility] 02-Dec-1930 [id number]   HPI:  Juan Benjamin is a 86 y.o. male patient of Dr Percival Spanish, who presents today for a lipid clinic evaluation. In addition to hyperlipidemia, his medical history is significant for hypertension, CAD (with CABG X 4 in 2000 chronic occlusion of CEA, right renal artery stent) and hypothyroidism.  He currently takes rosuvastatin 10 mg daily and has no issues with myalgias.  States he has never taken a higher dose of this.    Current Medications: rosuvastatin 10 mg qd  Cholesterol Goals: LDL < 70   Intolerant/previously tried: - atorvastatin, simvastatin  Exercise:  Mows, walks most days, treadmill 15 min most days  Labs: 7/21 TC 206, TG 111, HDL 61, LDL 130 (rosuvastatin 10 mg daily)   Current Outpatient Medications  Medication Sig Dispense Refill  . acetaminophen (TYLENOL) 325 MG tablet Take 325-650 mg by mouth every 6 (six) hours as needed for moderate pain (ear pain).     Marland Kitchen allopurinol (ZYLOPRIM) 100 MG tablet Take 100 mg by mouth at bedtime.     Marland Kitchen amLODipine (NORVASC) 5 MG tablet Take 5 mg by mouth daily.     Marland Kitchen aspirin 81 MG tablet Take 1 tablet (81 mg total) by mouth daily. 90 tablet 3  . benazepril (LOTENSIN) 10 MG tablet Take 10 mg by mouth daily.     . Cholecalciferol (VITAMIN D-3) 125 MCG (5000 UT) TABS Take 5,000 Units by mouth daily.    Marland Kitchen EPINEPHrine 0.3 mg/0.3 mL IJ SOAJ injection Inject 0.3 mLs (0.3 mg total) into the muscle as needed for anaphylaxis. 1 each 0  . ibuprofen (ADVIL,MOTRIN) 200 MG tablet Take 200 mg by mouth every 6 (six) hours as needed for mild pain or moderate pain (ear pain).     Marland Kitchen levothyroxine (SYNTHROID) 88 MCG tablet Take 88 mcg by mouth daily before breakfast.     . multivitamin-lutein (OCUVITE-LUTEIN) CAPS capsule Take 1 capsule by mouth daily.    . propranolol (INDERAL) 40 MG tablet Take 40 mg by mouth daily.      . rosuvastatin (CRESTOR) 20 MG tablet Take 1 tablet (20 mg total) by mouth daily. 90  tablet 3  . vitamin B-12 (CYANOCOBALAMIN) 1000 MCG tablet Take 1,000 mcg by mouth daily.    Marland Kitchen zinc oxide (BALMEX) 11.3 % CREA cream Apply 1 application topically daily as needed (pressure spot).     No current facility-administered medications for this visit.    Allergies  Allergen Reactions  . Wasp Venom Anaphylaxis  . Bee Venom   . Crestor [Rosuvastatin]     myalgia  . Gemfibrozil Other (See Comments)    unknown  . Indomethacin Other (See Comments)    unknown  . Lipitor [Atorvastatin]     myalgia  . Lisinopril Cough  . Rofecoxib Other (See Comments)    unknown  . Sildenafil Other (See Comments)    unknown  . Simvastatin Other (See Comments)    unknown    Past Medical History:  Diagnosis Date  . Benign tumor    gland hyperplasia  . Carotid artery occlusion    Chronic occlusion of left, R CEA  . Coronary artery disease   . Hyperlipidemia   . Hypertension   . Hypothyroidism   . Prostate cancer ([place])     Blood pressure (!) 154/80, pulse 67, resp. rate 17, height 5\' 7"  (1.702 m), weight 147 lb (66.7 kg), SpO2 95 %.   Hypertension Patient with ASCVD and LDL elevated  at 130.  Patient comes in adamant that he is not interested in any injections.  Discussed options of increasing rosuvastatin, adding ezetimibe or bempedoic acid.  Reviewed pros and cons of each option.  Patient would prefer to start by increasing rosuvastatin to 20 mg daily.  Reviewed concern for risk of myalgias at higher doses and patient aware to call office should he have problems.  Will have him repeat labs in 3 months, can consider addition of ezetimibe or bempedoic acid at that time if needed.     Tommy Medal PharmD CPP [facility] [address] [place] [place], [place] [address] [phone]

## 2020-03-16 NOTE — Patient Instructions (Addendum)
Your Results:             Your most recent labs Goal  Total Cholesterol 206 < 200  Triglycerides 111 < 150  HDL (happy/good cholesterol) 61 > 40  LDL (lousy/bad cholesterol 130 < 70      Medication changes:  Increase the rosuvastatin to 20 mg once daily.  If this causes you to have any muscle pains, please stop and call our office at [phone] (ask for Kristeena Meineke or Raquel)  Lab orders:  Repeat blood work with your primary MD in December  Patient Assistance:  The [employer] foundation offers assistance to help pay for medication copays.  They will cover copays for all cholesterol lowering meds, including statins, fibrates, omega-3 oils, ezetimibe, Repatha, Praluent, Nexletol, Nexlizet.  The cards are usually good for $2,500 or 12 months, whichever comes first. 1. Go to healthwellfoundation.org 2. Click on "Apply Now" 3. Answer questions as to whom is applying (patient or representative) 4. Your disease fund will be "hypercholesterolemia - Medicare access" 5. They will ask questions about finances and which medications you are taking for cholesterol 6. When you submit, the approval is usually within minutes.  You will need to print the card information from the site 7. You will need to show this information to your pharmacy, they will bill your Medicare Part D plan first -then bill [employer] --for the copay.   You can also call them at [phone], although the hold times can be quite long.   Thank you for choosing [facility]

## 2020-03-17 ENCOUNTER — Encounter: Payer: Self-pay | Admitting: Pharmacist Clinician (PhC)/ Clinical Pharmacy Specialist

## 2020-03-17 ENCOUNTER — Other Ambulatory Visit (HOSPITAL_COMMUNITY)
Admission: RE | Admit: 2020-03-17 | Discharge: 2020-03-17 | Disposition: A | Discharge status: Home / Self Care | Payer: PPO | Source: Ambulatory Visit | Attending: General Surgery | Admitting: General Surgery

## 2020-03-17 DIAGNOSIS — Z20822 Contact with and (suspected) exposure to covid-19: Secondary | ICD-10-CM | POA: Diagnosis not present

## 2020-03-17 LAB — SARS CORONAVIRUS 2 (TAT 6-24 HRS): SARS Coronavirus 2: NEGATIVE

## 2020-03-17 MED ORDER — ROSUVASTATIN CALCIUM 20 MG PO TABS: 20.0000 mg | ORAL_TABLET | Freq: Every day | ORAL | 3 refills | Status: DC

## 2020-03-17 NOTE — Assessment & Plan Note (Signed)
Patient with ASCVD and LDL elevated at 130.  Patient comes in adamant that he is not interested in any injections.  Discussed options of increasing rosuvastatin, adding ezetimibe or bempedoic acid.  Reviewed pros and cons of each option.  Patient would prefer to start by increasing rosuvastatin to 20 mg daily.  Reviewed concern for risk of myalgias at higher doses and patient aware to call office should he have problems.  Will have him repeat labs in 3 months, can consider addition of ezetimibe or bempedoic acid at that time if needed.

## 2020-03-20 ENCOUNTER — Encounter (HOSPITAL_COMMUNITY): Payer: Self-pay | Admitting: General Surgery

## 2020-03-20 ENCOUNTER — Other Ambulatory Visit: Payer: Self-pay | Admitting: *Deleted

## 2020-03-20 ENCOUNTER — Other Ambulatory Visit: Payer: Self-pay

## 2020-03-20 DIAGNOSIS — I6523 Occlusion and stenosis of bilateral carotid arteries: Secondary | ICD-10-CM

## 2020-03-20 NOTE — H&P (Signed)
Juan Benjamin Location: [facility] Patient #: [id number] DOB: 1930-12-19 Widowed / Language: Cleophus Molt / Race: White Male   History of Present Illness  The patient is a 86 year old male who presents with malignant melanoma. Pt is an 86 yo M with a history of multiple skin lesions who has a new diagnosis of left upper arm melanoma 11/2019. He thinks he had a lesion there for "10-12 years." He can't remember if it was changing. He was actually concerned about a spot on the chest and one on the forehead. These were basal cell.   The arm melanoma was 2.9 mm thick with ulceration, for a pT3aNx melanoma. Margins were negative, no regression, microsatellitosis, LVI or perineural invasion were present.   Pathology is from the [facility] path [phone].   Of note, he is very active. He mows the yard, does work around [employer], and volunteers at [employer].    Diagnostic Studies History Colonoscopy  >10 years ago  Allergies Indomethacin *ANALGESICS - ANTI-INFLAMMATORY*  Rofecoxib *ANALGESICS - ANTI-INFLAMMATORY*  BEE VENOM  Allergies Reconciled   Medication History  Allopurinol (100MG  Tablet, Oral) Active. amLODIPine Besylate (10MG  Tablet, Oral) Active. Atorvastatin Calcium (10MG  Tablet, Oral) Active. Benazepril HCl (40MG  Tablet, Oral) Active. Levothyroxine Sodium (75MCG Tablet, Oral) Active. predniSONE (20MG  Tablet, Oral) Active. Propranolol HCl (40MG  Tablet, Oral) Active. Medications Reconciled  Social History Caffeine use  Carbonated beverages, Coffee, Tea. No alcohol use  Tobacco use  Former smoker.  Family History  Diabetes Mellitus  Son.  Other Problems  Gastroesophageal Reflux Disease  Heart murmur  High blood pressure  Hypercholesterolemia  Melanoma  Myocardial infarction  Other disease, cancer, significant illness  Prostate Cancer  Thyroid Disease  Vascular Disease     Review of  Systems  General Present- Night Sweats and Weight Loss. Not Present- Appetite Loss, Chills, Fatigue, Fever and Weight Gain. Skin Not Present- Change in Wart/Mole, Dryness, Hives, Jaundice, New Lesions, Non-Healing Wounds, Rash and Ulcer. HEENT Present- Hearing Loss and Wears glasses/contact lenses. Not Present- Earache, Hoarseness, Nose Bleed, Oral Ulcers, Ringing in the Ears, Seasonal Allergies, Sinus Pain, Sore Throat, Visual Disturbances and Yellow Eyes. Respiratory Present- Wheezing. Not Present- Bloody sputum, Chronic Cough, Difficulty Breathing and Snoring. Breast Not Present- Breast Mass, Breast Pain, Nipple Discharge and Skin Changes. Cardiovascular Present- Leg Cramps and Shortness of Breath. Not Present- Chest Pain, Difficulty Breathing Lying Down, Palpitations, Rapid Heart Rate and Swelling of Extremities. Gastrointestinal Present- Indigestion. Not Present- Abdominal Pain, Bloating, Bloody Stool, Change in Bowel Habits, Chronic diarrhea, Constipation, Difficulty Swallowing, Excessive gas, Gets full quickly at meals, Hemorrhoids, Nausea, Rectal Pain and Vomiting. Musculoskeletal Present- Muscle Weakness. Not Present- Back Pain, Joint Pain, Joint Stiffness, Muscle Pain and Swelling of Extremities. Neurological Present- Decreased Memory. Not Present- Fainting, Headaches, Numbness, Seizures, Tingling, Tremor, Trouble walking and Weakness. Endocrine Present- Cold Intolerance. Not Present- Excessive Hunger, Hair Changes, Heat Intolerance, Hot flashes and New Diabetes. Hematology Present- Easy Bruising. Not Present- Blood Thinners, Excessive bleeding, Gland problems, HIV and Persistent Infections. All other systems negative  Vitals Weight: 139.25 lb Height: 67in Body Surface Area: 1.73 m Body Mass Index: 21.81 kg/m  Temp.: 97.43F  Pulse: 53 (Regular)  BP: 134/76(Sitting, Left Arm, Standard)       Physical Exam  General Mental Status-Alert. General  Appearance-Consistent with stated age. Hydration-Well hydrated. Voice-Normal.  Integumentary Note: numerous scaly areas are present on upper arms and face. The left upper arm has a scar near the lower  anterior border of the deltoid. There is a small firm nodule adjacent to the scar. He also has small bruising on his arms bilaterally consistent with ASA use.   Head and Neck Head-normocephalic, atraumatic with no lesions or palpable masses. Trachea-midline. Thyroid Gland Characteristics - normal size and consistency.  Eye Eyeball - Bilateral-Extraocular movements intact. Sclera/Conjunctiva - Bilateral-No scleral icterus.  Chest and Lung Exam Chest and lung exam reveals -quiet, even and easy respiratory effort with no use of accessory muscles and on auscultation, normal breath sounds, no adventitious sounds and normal vocal resonance. Inspection Chest Wall - Normal. Back - normal.  Cardiovascular Cardiovascular examination reveals -normal heart sounds, regular rate and rhythm with no murmurs and normal pedal pulses bilaterally.  Abdomen Inspection Inspection of the abdomen reveals - No Hernias. Palpation/Percussion Palpation and Percussion of the abdomen reveal - Soft, Non Tender, No Rebound tenderness, No Rigidity (guarding) and No hepatosplenomegaly. Auscultation Auscultation of the abdomen reveals - Bowel sounds normal.  Neurologic Neurologic evaluation reveals -alert and oriented x 3 with no impairment of recent or remote memory. Mental Status-Normal.  Musculoskeletal Global Assessment -Note: no gross deformities.  Normal Exam - Left-Upper Extremity Strength Normal and Lower Extremity Strength Normal. Normal Exam - Right-Upper Extremity Strength Normal and Lower Extremity Strength Normal.  Lymphatic Head & Neck  General Head & Neck Lymphatics: Bilateral - Description - Normal. Axillary  General Axillary Region: Bilateral - Description -  Normal. Tenderness - Non Tender. Femoral & Inguinal  Generalized Femoral & Inguinal Lymphatics: Bilateral - Description - No Generalized lymphadenopathy.    Assessment & Plan  MELANOMA OF LEFT UPPER ARM (C43.62) Impression: Pt has a new dx of pT3a left upper arm melanoma. I will plan wide local excision with advancement flap closure and sentinel lymph node biopsy. The patient is very active overall and in good shape. I reviewed the surgery, incisions, risks, and recovery with the patient and his support person. I advised that he would have some permanent numbness of the upper arm.  I discussed risk of bleeding, wound dehiscence, infection, and seroma in the axilla. Current Plans You are being scheduled for surgery- Our schedulers will call you.  You should hear from our office's scheduling department within 5 working days about the location, date, and time of surgery. We try to make accommodations for patient's preferences in scheduling surgery, but sometimes the OR schedule or the surgeon's schedule prevents [place] from making those accommodations.  If you have not heard from our office [phone]) in 5 working days, call the office and ask for your surgeon's nurse.  If you have other questions about your diagnosis, plan, or surgery, call the office and ask for your surgeon's nurse.  Pt Education - Melanoma: skin cancer HISTORY OF HEART ATTACK (I25.2) Impression: I have messaged Dr. Percival Spanish for help with risk stratification and question of holding aspirin.

## 2020-03-20 NOTE — Progress Notes (Signed)
Spoke with pt's daughter, Santiago Glad for pre-op call. DPR on file. Pt has hx of CAD with CABG in 2000. Dr. Percival Spanish is his cardiologist and cardiac clearance noted on Echo report done 02/25/20. Santiago Glad states pt has not complained of any recent chest pain or shortness of breath. Pt is not diabetic.  Covid test done 03/17/20 and it's negative. Santiago Glad states patient has been in quarantine since the test was done and understands that he stays in quarantine until he comes to the hospital tomorrow.

## 2020-03-21 ENCOUNTER — Ambulatory Visit (HOSPITAL_COMMUNITY)
Admission: RE | Admit: 2020-03-21 | Discharge: 2020-03-21 | Disposition: A | Discharge status: Home / Self Care | Payer: PPO | Source: Ambulatory Visit | Attending: General Surgery | Admitting: General Surgery

## 2020-03-21 ENCOUNTER — Encounter (HOSPITAL_COMMUNITY)
Admission: RE | Disposition: A | Discharge status: Home / Self Care | Payer: Self-pay | Source: Home / Self Care | Attending: General Surgery

## 2020-03-21 ENCOUNTER — Ambulatory Visit (HOSPITAL_COMMUNITY): Payer: PPO | Admitting: Anesthesiology

## 2020-03-21 ENCOUNTER — Other Ambulatory Visit: Payer: Self-pay

## 2020-03-21 ENCOUNTER — Encounter (HOSPITAL_COMMUNITY): Payer: Self-pay | Admitting: General Surgery

## 2020-03-21 ENCOUNTER — Ambulatory Visit (HOSPITAL_COMMUNITY)
Admission: RE | Admit: 2020-03-21 | Discharge: 2020-03-21 | Disposition: A | Discharge status: Home / Self Care | Payer: PPO | Attending: General Surgery | Admitting: General Surgery

## 2020-03-21 DIAGNOSIS — I10 Essential (primary) hypertension: Secondary | ICD-10-CM | POA: Diagnosis not present

## 2020-03-21 DIAGNOSIS — C4362 Malignant melanoma of left upper limb, including shoulder: Secondary | ICD-10-CM | POA: Diagnosis not present

## 2020-03-21 DIAGNOSIS — Z87891 Personal history of nicotine dependence: Secondary | ICD-10-CM | POA: Insufficient documentation

## 2020-03-21 DIAGNOSIS — E785 Hyperlipidemia, unspecified: Secondary | ICD-10-CM | POA: Diagnosis not present

## 2020-03-21 DIAGNOSIS — Z886 Allergy status to analgesic agent status: Secondary | ICD-10-CM | POA: Diagnosis not present

## 2020-03-21 DIAGNOSIS — Z8546 Personal history of malignant neoplasm of prostate: Secondary | ICD-10-CM | POA: Insufficient documentation

## 2020-03-21 DIAGNOSIS — I2510 Atherosclerotic heart disease of native coronary artery without angina pectoris: Secondary | ICD-10-CM | POA: Diagnosis not present

## 2020-03-21 HISTORY — DX: Pneumonia, unspecified organism: J18.9

## 2020-03-21 HISTORY — DX: Cardiac murmur, unspecified: R01.1

## 2020-03-21 HISTORY — DX: Constipation, unspecified: K59.00

## 2020-03-21 HISTORY — DX: Gastro-esophageal reflux disease without esophagitis: K21.9

## 2020-03-21 HISTORY — PX: MELANOMA EXCISION WITH SENTINEL LYMPH NODE BIOPSY: SHX5267

## 2020-03-21 LAB — BASIC METABOLIC PANEL
Anion gap: 10 (ref 5–15)
BUN: 22 mg/dL (ref 8–23)
CO2: 23 mmol/L (ref 22–32)
Calcium: 9.9 mg/dL (ref 8.9–10.3)
Chloride: 108 mmol/L (ref 98–111)
Creatinine, Ser: 1.38 mg/dL — ABNORMAL HIGH (ref 0.61–1.24)
GFR, Estimated: 50 mL/min — ABNORMAL LOW (ref >60)
Glucose, Bld: 99 mg/dL (ref 70–99)
Potassium: 4.1 mmol/L (ref 3.5–5.1)
Sodium: 141 mmol/L (ref 135–145)

## 2020-03-21 SURGERY — MELANOMA EXCISION WITH SENTINEL LYMPH NODE BIOPSY
Anesthesia: General | Site: Arm Upper | Laterality: Left

## 2020-03-21 MED ORDER — ORAL CARE MOUTH RINSE: 15.0000 mL | Freq: Once | OROMUCOSAL | Status: AC

## 2020-03-21 MED ORDER — MIDAZOLAM HCL 2 MG/2ML IJ SOLN
INTRAMUSCULAR | Status: AC
  Filled 2020-03-21: qty 2

## 2020-03-21 MED ORDER — 0.9 % SODIUM CHLORIDE (POUR BTL) OPTIME
TOPICAL | Status: DC | PRN
  Administered 2020-03-21: 1000 mL

## 2020-03-21 MED ORDER — ACETAMINOPHEN 500 MG PO TABS
1000.0000 mg | ORAL_TABLET | ORAL | Status: AC
  Administered 2020-03-21: 1000 mg via ORAL
  Filled 2020-03-21: qty 2

## 2020-03-21 MED ORDER — PROPOFOL 10 MG/ML IV BOLUS
INTRAVENOUS | Status: AC
  Filled 2020-03-21: qty 20

## 2020-03-21 MED ORDER — FENTANYL CITRATE (PF) 100 MCG/2ML IJ SOLN
INTRAMUSCULAR | Status: AC
  Filled 2020-03-21: qty 2

## 2020-03-21 MED ORDER — PHENYLEPHRINE HCL (PRESSORS) 10 MG/ML IV SOLN
INTRAVENOUS | Status: DC | PRN
  Administered 2020-03-21 (×2): 100 ug via INTRAVENOUS

## 2020-03-21 MED ORDER — LACTATED RINGERS IV SOLN: INTRAVENOUS | Status: DC

## 2020-03-21 MED ORDER — LIDOCAINE 2% (20 MG/ML) 5 ML SYRINGE
INTRAMUSCULAR | Status: AC
  Filled 2020-03-21: qty 5

## 2020-03-21 MED ORDER — TECHNETIUM TC 99M SULFUR COLLOID FILTERED
0.5000 | Freq: Once | INTRAVENOUS | Status: AC | PRN
  Administered 2020-03-21: 0.5 via INTRADERMAL

## 2020-03-21 MED ORDER — SODIUM CHLORIDE (PF) 0.9 % IJ SOLN
INTRAMUSCULAR | Status: AC
  Filled 2020-03-21: qty 10

## 2020-03-21 MED ORDER — FENTANYL CITRATE (PF) 250 MCG/5ML IJ SOLN
INTRAMUSCULAR | Status: AC
  Filled 2020-03-21: qty 5

## 2020-03-21 MED ORDER — OXYCODONE HCL 5 MG PO TABS: 2.5000 mg | ORAL_TABLET | Freq: Four times a day (QID) | ORAL | 0 refills | Status: DC | PRN

## 2020-03-21 MED ORDER — LIDOCAINE-EPINEPHRINE (PF) 1 %-1:200000 IJ SOLN
INTRAMUSCULAR | Status: AC
  Filled 2020-03-21: qty 30

## 2020-03-21 MED ORDER — ONDANSETRON HCL 4 MG/2ML IJ SOLN
INTRAMUSCULAR | Status: DC | PRN
  Administered 2020-03-21: 4 mg via INTRAVENOUS

## 2020-03-21 MED ORDER — LIDOCAINE 2% (20 MG/ML) 5 ML SYRINGE
INTRAMUSCULAR | Status: DC | PRN
  Administered 2020-03-21: 60 mg via INTRAVENOUS

## 2020-03-21 MED ORDER — PROPOFOL 10 MG/ML IV BOLUS
INTRAVENOUS | Status: DC | PRN
  Administered 2020-03-21: 120 mg via INTRAVENOUS
  Administered 2020-03-21: 10 mg via INTRAVENOUS

## 2020-03-21 MED ORDER — AMISULPRIDE (ANTIEMETIC) 5 MG/2ML IV SOLN: 10.0000 mg | Freq: Once | INTRAVENOUS | Status: DC | PRN

## 2020-03-21 MED ORDER — CEFAZOLIN SODIUM-DEXTROSE 2-4 GM/100ML-% IV SOLN
2.0000 g | INTRAVENOUS | Status: AC
  Administered 2020-03-21: 2 g via INTRAVENOUS
  Filled 2020-03-21: qty 100

## 2020-03-21 MED ORDER — FENTANYL CITRATE (PF) 100 MCG/2ML IJ SOLN
INTRAMUSCULAR | Status: DC | PRN
  Administered 2020-03-21: 25 ug via INTRAVENOUS

## 2020-03-21 MED ORDER — CHLORHEXIDINE GLUCONATE CLOTH 2 % EX PADS: 6.0000 | MEDICATED_PAD | Freq: Once | CUTANEOUS | Status: DC

## 2020-03-21 MED ORDER — METHYLENE BLUE 1 % INJ SOLN
INTRAMUSCULAR | Status: DC | PRN
  Administered 2020-03-21: 1 mL

## 2020-03-21 MED ORDER — FENTANYL CITRATE (PF) 100 MCG/2ML IJ SOLN: 25.0000 ug | INTRAMUSCULAR | Status: DC | PRN

## 2020-03-21 MED ORDER — METHYLENE BLUE 0.5 % INJ SOLN
INTRAVENOUS | Status: AC
  Filled 2020-03-21: qty 10

## 2020-03-21 MED ORDER — LIDOCAINE-EPINEPHRINE (PF) 1 %-1:200000 IJ SOLN
INTRAMUSCULAR | Status: DC | PRN
  Administered 2020-03-21: 30 mL

## 2020-03-21 MED ORDER — CHLORHEXIDINE GLUCONATE 0.12 % MT SOLN
15.0000 mL | Freq: Once | OROMUCOSAL | Status: AC
  Administered 2020-03-21: 15 mL via OROMUCOSAL
  Filled 2020-03-21: qty 15

## 2020-03-21 MED ORDER — DEXAMETHASONE SODIUM PHOSPHATE 10 MG/ML IJ SOLN
INTRAMUSCULAR | Status: DC | PRN
  Administered 2020-03-21: 5 mg via INTRAVENOUS

## 2020-03-21 MED ORDER — BUPIVACAINE HCL (PF) 0.25 % IJ SOLN
INTRAMUSCULAR | Status: AC
  Filled 2020-03-21: qty 30

## 2020-03-21 MED ORDER — BUPIVACAINE HCL (PF) 0.25 % IJ SOLN
INTRAMUSCULAR | Status: DC | PRN
  Administered 2020-03-21: 30 mL

## 2020-03-21 MED ORDER — EPHEDRINE SULFATE 50 MG/ML IJ SOLN
INTRAMUSCULAR | Status: DC | PRN
  Administered 2020-03-21: 10 mg via INTRAVENOUS
  Administered 2020-03-21: 5 mg via INTRAVENOUS
  Administered 2020-03-21: 10 mg via INTRAVENOUS

## 2020-03-21 SURGICAL SUPPLY — 64 items
BENZOIN TINCTURE PRP APPL 2/3 (GAUZE/BANDAGES/DRESSINGS) ×2 IMPLANT
BLADE SURG 10 STRL SS (BLADE) ×2 IMPLANT
BNDG COHESIVE 4X5 TAN STRL (GAUZE/BANDAGES/DRESSINGS) IMPLANT
BNDG GAUZE ELAST 4 BULKY (GAUZE/BANDAGES/DRESSINGS) ×2 IMPLANT
CANISTER SUCT 3000ML PPV (MISCELLANEOUS) ×2 IMPLANT
CHLORAPREP W/TINT 26 (MISCELLANEOUS) ×2 IMPLANT
CLIP VESOCCLUDE MED 24/CT (CLIP) ×2 IMPLANT
CLIP VESOCCLUDE SM WIDE 24/CT (CLIP) ×2 IMPLANT
CLSR STERI-STRIP ANTIMIC 1/2X4 (GAUZE/BANDAGES/DRESSINGS) ×2 IMPLANT
CNTNR URN SCR LID CUP LEK RST (MISCELLANEOUS) ×5 IMPLANT
CONT SPEC 4OZ STRL OR WHT (MISCELLANEOUS) ×10
COVER MAYO STAND STRL (DRAPES) IMPLANT
COVER PROBE W GEL 5X96 (DRAPES) ×2 IMPLANT
COVER SURGICAL LIGHT HANDLE (MISCELLANEOUS) ×2 IMPLANT
COVER WAND RF STERILE (DRAPES) IMPLANT
DECANTER SPIKE VIAL GLASS SM (MISCELLANEOUS) ×2 IMPLANT
DERMABOND ADVANCED (GAUZE/BANDAGES/DRESSINGS) ×1
DERMABOND ADVANCED .7 DNX12 (GAUZE/BANDAGES/DRESSINGS) ×1 IMPLANT
DRAPE HALF SHEET 40X57 (DRAPES) ×2 IMPLANT
DRAPE LAPAROSCOPIC ABDOMINAL (DRAPES) IMPLANT
DRSG TEGADERM 2-3/8X2-3/4 SM (GAUZE/BANDAGES/DRESSINGS) ×2 IMPLANT
DRSG TEGADERM 4X4.75 (GAUZE/BANDAGES/DRESSINGS) ×2 IMPLANT
DRSG TELFA 3X8 NADH (GAUZE/BANDAGES/DRESSINGS) ×2 IMPLANT
ELECT BLADE INSULATED 4IN (ELECTROSURGICAL) ×2
ELECT REM PT RETURN 9FT ADLT (ELECTROSURGICAL) ×2
ELECTRODE BLADE INSULATED 4IN (ELECTROSURGICAL) ×1 IMPLANT
ELECTRODE REM PT RTRN 9FT ADLT (ELECTROSURGICAL) ×1 IMPLANT
GAUZE SPONGE 2X2 8PLY STRL LF (GAUZE/BANDAGES/DRESSINGS) ×1 IMPLANT
GAUZE SPONGE 4X4 12PLY STRL (GAUZE/BANDAGES/DRESSINGS) ×2 IMPLANT
GAUZE SPONGE 4X4 12PLY STRL LF (GAUZE/BANDAGES/DRESSINGS) ×2 IMPLANT
GLOVE BIO SURGEON STRL SZ 6 (GLOVE) ×2 IMPLANT
GLOVE BIO SURGEON STRL SZ 6.5 (GLOVE) ×2 IMPLANT
GLOVE INDICATOR 6.5 STRL GRN (GLOVE) ×2 IMPLANT
GOWN STRL REUS W/ TWL LRG LVL3 (GOWN DISPOSABLE) ×3 IMPLANT
GOWN STRL REUS W/TWL 2XL LVL3 (GOWN DISPOSABLE) ×4 IMPLANT
GOWN STRL REUS W/TWL LRG LVL3 (GOWN DISPOSABLE) ×6
KIT BASIN OR (CUSTOM PROCEDURE TRAY) ×2 IMPLANT
KIT TURNOVER KIT B (KITS) ×2 IMPLANT
MARKER SKIN DUAL TIP RULER LAB (MISCELLANEOUS) ×2 IMPLANT
NEEDLE 18GX1X1/2 (RX/OR ONLY) (NEEDLE) ×2 IMPLANT
NEEDLE 22X1 1/2 (OR ONLY) (NEEDLE) ×2 IMPLANT
NEEDLE FILTER BLUNT 18X 1/2SAF (NEEDLE)
NEEDLE FILTER BLUNT 18X1 1/2 (NEEDLE) IMPLANT
NEEDLE HYPO 25GX1X1/2 BEV (NEEDLE) ×2 IMPLANT
NS IRRIG 1000ML POUR BTL (IV SOLUTION) ×2 IMPLANT
PACK GENERAL/GYN (CUSTOM PROCEDURE TRAY) ×2 IMPLANT
PACK UNIVERSAL I (CUSTOM PROCEDURE TRAY) ×2 IMPLANT
PAD ARMBOARD 7.5X6 YLW CONV (MISCELLANEOUS) ×4 IMPLANT
PENCIL SMOKE EVACUATOR (MISCELLANEOUS) ×2 IMPLANT
SPECIMEN JAR MEDIUM (MISCELLANEOUS) ×2 IMPLANT
SPONGE GAUZE 2X2 STER 10/PKG (GAUZE/BANDAGES/DRESSINGS) ×1
STOCKINETTE IMPERVIOUS 9X36 MD (GAUZE/BANDAGES/DRESSINGS) ×2 IMPLANT
STRIP CLOSURE SKIN 1/2X4 (GAUZE/BANDAGES/DRESSINGS) ×2 IMPLANT
SUT ETHILON 2 0 FS 18 (SUTURE) ×2 IMPLANT
SUT MNCRL AB 4-0 PS2 18 (SUTURE) ×4 IMPLANT
SUT SILK 2 0 PERMA HAND 18 BK (SUTURE) ×4 IMPLANT
SUT VIC AB 2-0 SH 27 (SUTURE) ×8
SUT VIC AB 2-0 SH 27XBRD (SUTURE) ×4 IMPLANT
SUT VIC AB 3-0 SH 27 (SUTURE) ×6
SUT VIC AB 3-0 SH 27X BRD (SUTURE) ×3 IMPLANT
SYR BULB IRRIG 60ML STRL (SYRINGE) ×2 IMPLANT
SYR CONTROL 10ML LL (SYRINGE) ×4 IMPLANT
TOWEL GREEN STERILE (TOWEL DISPOSABLE) ×2 IMPLANT
TOWEL GREEN STERILE FF (TOWEL DISPOSABLE) ×2 IMPLANT

## 2020-03-21 NOTE — Anesthesia Postprocedure Evaluation (Signed)
Anesthesia Post Note  Patient: MICCO BOURBEAU  Procedure(s) Performed: WIDE LOCAL EXCISION LEFT UPPER ARM MELANOMA ADVANCEMENT FLAP CLOSURE FOR DEFECT 4X8CM SENTINEL LYMPH NODE MAPPING AND BIOPSY (Left Arm Upper)     Patient location during evaluation: PACU Anesthesia Type: General Level of consciousness: awake and alert Pain management: pain level controlled Vital Signs Assessment: post-procedure vital signs reviewed and stable Respiratory status: spontaneous breathing, nonlabored ventilation, respiratory function stable and patient connected to nasal cannula oxygen Cardiovascular status: blood pressure returned to baseline and stable Postop Assessment: no apparent nausea or vomiting Anesthetic complications: no   No complications documented.  Last Vitals:  Vitals:   03/21/20 1633 03/21/20 1648  BP: 138/68 (!) 133/53  Pulse: 67 67  Resp: 16 17  Temp:  (!) 36.2 C  SpO2: 95% 96%    Last Pain:  Vitals:   03/21/20 1648  TempSrc:   PainSc: 0-No pain                 Tiajuana Amass

## 2020-03-21 NOTE — Transfer of Care (Signed)
Immediate Anesthesia Transfer of Care Note  Patient: Juan Benjamin  Procedure(s) Performed: WIDE LOCAL EXCISION LEFT UPPER ARM MELANOMA ADVANCEMENT FLAP CLOSURE FOR DEFECT 4X8CM SENTINEL LYMPH NODE MAPPING AND BIOPSY (Left Arm Upper)  Patient Location: PACU  Anesthesia Type:General  Level of Consciousness: drowsy and patient cooperative  Airway & Oxygen Therapy: Patient Spontanous Breathing and Patient connected to nasal cannula oxygen  Post-op Assessment: Report given to RN, Post -op Vital signs reviewed and stable and Patient moving all extremities  Post vital signs: Reviewed and stable  Last Vitals:  Vitals Value Taken Time  BP 130/57 03/21/20 1604  Temp    Pulse 69 03/21/20 1605  Resp 23 03/21/20 1605  SpO2 100 % 03/21/20 1605  Vitals shown include unvalidated device data.  Last Pain:  Vitals:   03/21/20 1126  TempSrc:   PainSc: 0-No pain      Patients Stated Pain Goal: 3 (26/33/35 4562)  Complications: No complications documented.

## 2020-03-21 NOTE — Discharge Instructions (Addendum)
[facility] °Office Phone Number [phone] ° ° POST OP INSTRUCTIONS ° °Always review your discharge instruction sheet given to you by the facility where your surgery was performed. ° °IF YOU HAVE DISABILITY OR FAMILY LEAVE FORMS, YOU MUST BRING THEM TO THE OFFICE FOR PROCESSING.  DO NOT GIVE THEM TO YOUR DOCTOR. ° °1. A prescription for pain medication may be given to you upon discharge.  Take your pain medication as prescribed, if needed.  If narcotic pain medicine is not needed, then you may take acetaminophen (Tylenol) or ibuprofen (Advil) as needed. °2. Take your usually prescribed medications unless otherwise directed °3. If you need a refill on your pain medication, please contact your pharmacy.  They will contact our office to request authorization.  Prescriptions will not be filled after 5pm or on week-ends. °4. You should eat very light the first 24 hours after surgery, such as soup, crackers, pudding, etc.  Resume your normal diet the day after surgery °5. It is common to experience some constipation if taking pain medication after surgery.  Increasing fluid intake and taking a stool softener will usually help or prevent this problem from occurring.  A mild laxative (Milk of Magnesia or Miralax) should be taken according to package directions if there are no bowel movements after 48 hours. °6. You may shower in 48 hours.  The surgical glue will flake off in 2-3 weeks.   °7. ACTIVITIES:  No strenuous activity or heavy lifting for 2 week.   °a. You may drive when you no longer are taking prescription pain medication, you can comfortably wear a seatbelt, and you can safely maneuver your car and apply brakes. °b. RETURN TO WORK:  __________n/a_______________ °You should see your doctor in the office for a follow-up appointment approximately three-four weeks after your surgery.   ° °WHEN TO CALL YOUR DOCTOR: °1. Fever over 101.0 °2. Nausea and/or vomiting. °3. Extreme swelling or  bruising. °4. Continued bleeding from incision. °5. Increased pain, redness, or drainage from the incision. ° °The clinic staff is available to answer your questions during regular business hours.  Please don’t hesitate to call and ask to speak to one of the nurses for clinical concerns.  If you have a medical emergency, go to the nearest emergency room or call 911.  A surgeon from [facility] is always on call at the hospital. ° °For further questions, please visit [email or web address]  ° °

## 2020-03-21 NOTE — Anesthesia Preprocedure Evaluation (Signed)
Anesthesia Evaluation  Patient identified by MRN, date of birth, ID band Patient awake    Reviewed: Allergy & Precautions, NPO status , Patient's Chart, lab work & pertinent test results, reviewed documented beta blocker date and time   Airway Mallampati: II  TM Distance: >3 FB Neck ROM: Full    Dental  (+) Dental Advisory Given   Pulmonary neg pulmonary ROS, former smoker,    breath sounds clear to auscultation       Cardiovascular hypertension, Pt. on medications and Pt. on home beta blockers + CAD and + CABG  + Valvular Problems/Murmurs AS and AI  Rhythm:Regular Rate:Normal  Normal EF. Mod AS/ AI   Neuro/Psych negative neurological ROS     GI/Hepatic Neg liver ROS, GERD  ,  Endo/Other  Hypothyroidism   Renal/GU Renal InsufficiencyRenal disease     Musculoskeletal   Abdominal   Peds  Hematology negative hematology ROS (+)   Anesthesia Other Findings   Reproductive/Obstetrics                             Anesthesia Physical Anesthesia Plan  ASA: III  Anesthesia Plan: General   Post-op Pain Management:    Induction: Intravenous  PONV Risk Score and Plan: 2 and Ondansetron and Dexamethasone  Airway Management Planned: LMA  Additional Equipment:   Intra-op Plan:   Post-operative Plan: Extubation in OR  Informed Consent: I have reviewed the patients History and Physical, chart, labs and discussed the procedure including the risks, benefits and alternatives for the proposed anesthesia with the patient or authorized representative who has indicated his/her understanding and acceptance.     Dental advisory given  Plan Discussed with: CRNA  Anesthesia Plan Comments:         Anesthesia Quick Evaluation

## 2020-03-21 NOTE — Interval H&P Note (Signed)
History and Physical Interval Note:  03/21/2020 11:20 AM  Juan Benjamin  has presented today for surgery, with the diagnosis of MELANOMA OF LEFT UPPER ARM.  The various methods of treatment have been discussed with the patient and family. After consideration of risks, benefits and other options for treatment, the patient has consented to  Procedure(s): WIDE LOCAL EXCISION LEFT UPPER ARM MELANOMA ADVANCEMENT FLAP CLOSURE FOR DEFECT 4X8CM SENTINEL LYMPH NODE MAPPING AND BIOPSY (Left) as a surgical intervention.  The patient's history has been reviewed, patient examined, no change in status, stable for surgery.  I have reviewed the patient's chart and labs.  Questions were answered to the patient's satisfaction.     Stark Klein

## 2020-03-21 NOTE — Op Note (Signed)
PRE-OPERATIVE DIAGNOSIS: at least T3aN0 left upper arm melanoma  POST-OPERATIVE DIAGNOSIS:  Same  PROCEDURE:  Procedure(s): Wide local excision 1.5-2 cm margins, advancement flap closure for defect 6.6 cm x 6.6 cm, sentinel lymph node mapping and biopsy  SURGEON:  Surgeon(s): Stark Klein, MD  ASSIST:  Ahmed Prima, MD, MHA, PGY5  ANESTHESIA:   local and general  DRAINS: none   LOCAL MEDICATIONS USED:  MARCAINE    and XYLOCAINE   SPECIMEN:  Source of Specimen:  wide local excision left upper arm melanoma, additional proximal margin, additional distal margin, two left axillary sentinel lymph nodes  FINDINGS:  No gross residual melanoma, SLN #1 palpable and cps 16; SLN #2 hot and palpable with cps 84  DISPOSITION OF SPECIMEN:  PATHOLOGY  COUNTS:  YES  PLAN OF CARE: Discharge to home after PACU  PATIENT DISPOSITION:  PACU - hemodynamically stable.    PROCEDURE:   Pt was identified in the holding area, taken to the OR, and placed supine on the OR table.  General anesthesia was induced.  Time out was performed according to the surgical safety checklist.  When all was correct, we continued.  One mL methylene blue was injected intradermally around the melanoma biopsy site.    The patient was placed into the supine position.  The left arm/axilla/chest were prepped and draped in sterile fashion.  The melanoma was identified and 1 and 2 cm margins were marked out.  Local was administered under the melanoma and the adjacent tissue.  A #10 blade was used to incise the skin around the melanoma.  The cautery was used to take the dissection down to the fascia.  The skin was marked in situ with orientation sutures.  The cautery was used to take the specimen off the fascia, and it was passed off the table.    Skin hooks were used to elevate the edges of the incision and the skin was freed up in all directions with the cautery to create skin flaps.  This was pulled together in an  longitudinal orientation. The skin was pulled together to check the tension. Deep interrupted 2-0 vicryl sutures were placed to relieve tension.  The proximal and distal aspects were trimmed to make the incision lay down flat. The skin was then reapproximated with 3-0 interrupted vicryl deep dermal sutures and 4-0 monocryl running subcuticular sutures.  Three 2-0 nylon horizontal mattress sutures were placed as well.    The point of maximum signal intensity was identified with the neoprobe.  A 4 cm incision was made with a #15 blade.  The subcutaneous tissues were divided with the cautery.  A Weitlaner retractor was used to assist with visualization.  The tonsil clamp was used to bluntly dissect the axillary fat pad.  Two sentinel lymph nodes were identified as described above.  The lymphovascular channels were clipped with hemoclips.  The nodes were passed off as specimens.  Hemostasis was achieved with the cautery.  The axilla was irrigated and closed with 3-0 Vicryl deep dermal interrupted sutures and 4-0 Monocryl running subcuticular suture.  The axilla was dressed with dermabond.    The melanoma site was cleaned, dried, and dressed with Benzoin, steristrips, gauze, and tegaderm.    Needle, sponge, and instrument counts were correct.  The patient was awakened from anesthesia and taken to the PACU in stable condition.

## 2020-03-21 NOTE — Anesthesia Procedure Notes (Signed)
Procedure Name: LMA Insertion Date/Time: 03/21/2020 2:26 PM Performed by: Terrence Dupont, RN Pre-anesthesia Checklist: Patient identified, Emergency Drugs available, Suction available and Patient being monitored Patient Re-evaluated:Patient Re-evaluated prior to induction Oxygen Delivery Method: Circle System Utilized Preoxygenation: Pre-oxygenation with 100% oxygen Induction Type: IV induction Ventilation: Mask ventilation without difficulty LMA: LMA inserted LMA Size: 4.0 Number of attempts: 1 Airway Equipment and Method: Bite block Placement Confirmation: positive ETCO2 and breath sounds checked- equal and bilateral Tube secured with: Tape Dental Injury: Teeth and Oropharynx as per pre-operative assessment

## 2020-03-22 ENCOUNTER — Encounter (HOSPITAL_COMMUNITY): Payer: Self-pay | Admitting: General Surgery

## 2020-03-27 DIAGNOSIS — C4362 Malignant melanoma of left upper limb, including shoulder: Secondary | ICD-10-CM | POA: Diagnosis not present

## 2020-03-27 LAB — SURGICAL PATHOLOGY

## 2020-05-11 DIAGNOSIS — E7800 Pure hypercholesterolemia, unspecified: Secondary | ICD-10-CM | POA: Diagnosis not present

## 2020-05-11 DIAGNOSIS — E039 Hypothyroidism, unspecified: Secondary | ICD-10-CM | POA: Diagnosis not present

## 2020-05-11 DIAGNOSIS — I10 Essential (primary) hypertension: Secondary | ICD-10-CM | POA: Diagnosis not present

## 2020-05-11 DIAGNOSIS — I129 Hypertensive chronic kidney disease with stage 1 through stage 4 chronic kidney disease, or unspecified chronic kidney disease: Secondary | ICD-10-CM | POA: Diagnosis not present

## 2020-05-11 DIAGNOSIS — N189 Chronic kidney disease, unspecified: Secondary | ICD-10-CM | POA: Diagnosis not present

## 2020-05-18 DIAGNOSIS — Z0000 Encounter for general adult medical examination without abnormal findings: Secondary | ICD-10-CM | POA: Diagnosis not present

## 2020-05-18 DIAGNOSIS — E785 Hyperlipidemia, unspecified: Secondary | ICD-10-CM | POA: Diagnosis not present

## 2020-05-18 DIAGNOSIS — E559 Vitamin D deficiency, unspecified: Secondary | ICD-10-CM | POA: Diagnosis not present

## 2020-05-18 DIAGNOSIS — E7800 Pure hypercholesterolemia, unspecified: Secondary | ICD-10-CM | POA: Diagnosis not present

## 2020-05-18 DIAGNOSIS — E538 Deficiency of other specified B group vitamins: Secondary | ICD-10-CM | POA: Diagnosis not present

## 2020-05-18 DIAGNOSIS — M109 Gout, unspecified: Secondary | ICD-10-CM | POA: Diagnosis not present

## 2020-05-18 DIAGNOSIS — I10 Essential (primary) hypertension: Secondary | ICD-10-CM | POA: Diagnosis not present

## 2020-05-18 DIAGNOSIS — E039 Hypothyroidism, unspecified: Secondary | ICD-10-CM | POA: Diagnosis not present

## 2020-05-18 DIAGNOSIS — N189 Chronic kidney disease, unspecified: Secondary | ICD-10-CM | POA: Diagnosis not present

## 2020-05-31 DIAGNOSIS — Z23 Encounter for immunization: Secondary | ICD-10-CM | POA: Diagnosis not present

## 2020-06-06 DIAGNOSIS — L304 Erythema intertrigo: Secondary | ICD-10-CM | POA: Diagnosis not present

## 2020-06-06 DIAGNOSIS — Z85828 Personal history of other malignant neoplasm of skin: Secondary | ICD-10-CM | POA: Diagnosis not present

## 2020-06-06 DIAGNOSIS — L905 Scar conditions and fibrosis of skin: Secondary | ICD-10-CM | POA: Diagnosis not present

## 2020-06-06 DIAGNOSIS — L218 Other seborrheic dermatitis: Secondary | ICD-10-CM | POA: Diagnosis not present

## 2020-06-06 DIAGNOSIS — D1801 Hemangioma of skin and subcutaneous tissue: Secondary | ICD-10-CM | POA: Diagnosis not present

## 2020-06-06 DIAGNOSIS — L820 Inflamed seborrheic keratosis: Secondary | ICD-10-CM | POA: Diagnosis not present

## 2020-06-06 DIAGNOSIS — L821 Other seborrheic keratosis: Secondary | ICD-10-CM | POA: Diagnosis not present

## 2020-06-06 DIAGNOSIS — D225 Melanocytic nevi of trunk: Secondary | ICD-10-CM | POA: Diagnosis not present

## 2020-06-06 DIAGNOSIS — Z85820 Personal history of malignant melanoma of skin: Secondary | ICD-10-CM | POA: Diagnosis not present

## 2020-06-06 DIAGNOSIS — L570 Actinic keratosis: Secondary | ICD-10-CM | POA: Diagnosis not present

## 2020-06-06 DIAGNOSIS — D485 Neoplasm of uncertain behavior of skin: Secondary | ICD-10-CM | POA: Diagnosis not present

## 2020-09-18 DIAGNOSIS — C4362 Malignant melanoma of left upper limb, including shoulder: Secondary | ICD-10-CM | POA: Diagnosis not present

## 2020-09-24 ENCOUNTER — Other Ambulatory Visit: Payer: Self-pay

## 2020-09-24 ENCOUNTER — Emergency Department (HOSPITAL_BASED_OUTPATIENT_CLINIC_OR_DEPARTMENT_OTHER): Payer: PPO

## 2020-09-24 ENCOUNTER — Inpatient Hospital Stay (HOSPITAL_BASED_OUTPATIENT_CLINIC_OR_DEPARTMENT_OTHER)
Admission: EM | Admit: 2020-09-24 | Discharge: 2020-10-04 | DRG: 418 | Disposition: A | Discharge status: Home Health | Payer: PPO | Attending: Internal Medicine | Admitting: Internal Medicine

## 2020-09-24 ENCOUNTER — Encounter (HOSPITAL_BASED_OUTPATIENT_CLINIC_OR_DEPARTMENT_OTHER): Payer: Self-pay | Admitting: Emergency Medicine

## 2020-09-24 DIAGNOSIS — F05 Delirium due to known physiological condition: Secondary | ICD-10-CM | POA: Diagnosis not present

## 2020-09-24 DIAGNOSIS — K449 Diaphragmatic hernia without obstruction or gangrene: Secondary | ICD-10-CM | POA: Diagnosis present

## 2020-09-24 DIAGNOSIS — E039 Hypothyroidism, unspecified: Secondary | ICD-10-CM | POA: Diagnosis present

## 2020-09-24 DIAGNOSIS — N281 Cyst of kidney, acquired: Secondary | ICD-10-CM | POA: Diagnosis not present

## 2020-09-24 DIAGNOSIS — Z888 Allergy status to other drugs, medicaments and biological substances status: Secondary | ICD-10-CM

## 2020-09-24 DIAGNOSIS — I6529 Occlusion and stenosis of unspecified carotid artery: Secondary | ICD-10-CM | POA: Diagnosis present

## 2020-09-24 DIAGNOSIS — I4891 Unspecified atrial fibrillation: Secondary | ICD-10-CM | POA: Diagnosis present

## 2020-09-24 DIAGNOSIS — I70201 Unspecified atherosclerosis of native arteries of extremities, right leg: Secondary | ICD-10-CM | POA: Diagnosis present

## 2020-09-24 DIAGNOSIS — R1033 Periumbilical pain: Secondary | ICD-10-CM | POA: Diagnosis present

## 2020-09-24 DIAGNOSIS — K828 Other specified diseases of gallbladder: Secondary | ICD-10-CM | POA: Diagnosis not present

## 2020-09-24 DIAGNOSIS — R1011 Right upper quadrant pain: Secondary | ICD-10-CM | POA: Diagnosis not present

## 2020-09-24 DIAGNOSIS — N1831 Chronic kidney disease, stage 3a: Secondary | ICD-10-CM | POA: Diagnosis present

## 2020-09-24 DIAGNOSIS — K8000 Calculus of gallbladder with acute cholecystitis without obstruction: Secondary | ICD-10-CM | POA: Diagnosis present

## 2020-09-24 DIAGNOSIS — K839 Disease of biliary tract, unspecified: Secondary | ICD-10-CM

## 2020-09-24 DIAGNOSIS — I723 Aneurysm of iliac artery: Secondary | ICD-10-CM | POA: Diagnosis present

## 2020-09-24 DIAGNOSIS — I358 Other nonrheumatic aortic valve disorders: Secondary | ICD-10-CM | POA: Diagnosis present

## 2020-09-24 DIAGNOSIS — E7800 Pure hypercholesterolemia, unspecified: Secondary | ICD-10-CM | POA: Diagnosis present

## 2020-09-24 DIAGNOSIS — Z79890 Hormone replacement therapy (postmenopausal): Secondary | ICD-10-CM

## 2020-09-24 DIAGNOSIS — K2970 Gastritis, unspecified, without bleeding: Secondary | ICD-10-CM | POA: Diagnosis present

## 2020-09-24 DIAGNOSIS — Z48815 Encounter for surgical aftercare following surgery on the digestive system: Secondary | ICD-10-CM | POA: Diagnosis not present

## 2020-09-24 DIAGNOSIS — Z825 Family history of asthma and other chronic lower respiratory diseases: Secondary | ICD-10-CM

## 2020-09-24 DIAGNOSIS — R109 Unspecified abdominal pain: Secondary | ICD-10-CM | POA: Diagnosis present

## 2020-09-24 DIAGNOSIS — D72829 Elevated white blood cell count, unspecified: Secondary | ICD-10-CM | POA: Diagnosis not present

## 2020-09-24 DIAGNOSIS — N261 Atrophy of kidney (terminal): Secondary | ICD-10-CM | POA: Diagnosis not present

## 2020-09-24 DIAGNOSIS — E785 Hyperlipidemia, unspecified: Secondary | ICD-10-CM | POA: Diagnosis present

## 2020-09-24 DIAGNOSIS — N179 Acute kidney failure, unspecified: Secondary | ICD-10-CM | POA: Diagnosis not present

## 2020-09-24 DIAGNOSIS — I350 Nonrheumatic aortic (valve) stenosis: Secondary | ICD-10-CM | POA: Diagnosis present

## 2020-09-24 DIAGNOSIS — Y838 Other surgical procedures as the cause of abnormal reaction of the patient, or of later complication, without mention of misadventure at the time of the procedure: Secondary | ICD-10-CM | POA: Diagnosis not present

## 2020-09-24 DIAGNOSIS — B370 Candidal stomatitis: Secondary | ICD-10-CM | POA: Diagnosis not present

## 2020-09-24 DIAGNOSIS — K660 Peritoneal adhesions (postprocedural) (postinfection): Secondary | ICD-10-CM | POA: Diagnosis present

## 2020-09-24 DIAGNOSIS — K279 Peptic ulcer, site unspecified, unspecified as acute or chronic, without hemorrhage or perforation: Secondary | ICD-10-CM | POA: Diagnosis not present

## 2020-09-24 DIAGNOSIS — K810 Acute cholecystitis: Secondary | ICD-10-CM | POA: Diagnosis not present

## 2020-09-24 DIAGNOSIS — Z01818 Encounter for other preprocedural examination: Secondary | ICD-10-CM | POA: Diagnosis not present

## 2020-09-24 DIAGNOSIS — K838 Other specified diseases of biliary tract: Secondary | ICD-10-CM | POA: Diagnosis not present

## 2020-09-24 DIAGNOSIS — K3189 Other diseases of stomach and duodenum: Secondary | ICD-10-CM | POA: Diagnosis present

## 2020-09-24 DIAGNOSIS — Z95820 Peripheral vascular angioplasty status with implants and grafts: Secondary | ICD-10-CM

## 2020-09-24 DIAGNOSIS — I2510 Atherosclerotic heart disease of native coronary artery without angina pectoris: Secondary | ICD-10-CM | POA: Diagnosis present

## 2020-09-24 DIAGNOSIS — R531 Weakness: Secondary | ICD-10-CM | POA: Diagnosis not present

## 2020-09-24 DIAGNOSIS — K82A1 Gangrene of gallbladder in cholecystitis: Secondary | ICD-10-CM | POA: Diagnosis present

## 2020-09-24 DIAGNOSIS — K9189 Other postprocedural complications and disorders of digestive system: Secondary | ICD-10-CM | POA: Diagnosis not present

## 2020-09-24 DIAGNOSIS — Z85820 Personal history of malignant melanoma of skin: Secondary | ICD-10-CM

## 2020-09-24 DIAGNOSIS — Z20822 Contact with and (suspected) exposure to covid-19: Secondary | ICD-10-CM | POA: Diagnosis present

## 2020-09-24 DIAGNOSIS — I129 Hypertensive chronic kidney disease with stage 1 through stage 4 chronic kidney disease, or unspecified chronic kidney disease: Secondary | ICD-10-CM | POA: Diagnosis present

## 2020-09-24 DIAGNOSIS — I714 Abdominal aortic aneurysm, without rupture, unspecified: Secondary | ICD-10-CM

## 2020-09-24 DIAGNOSIS — Z79899 Other long term (current) drug therapy: Secondary | ICD-10-CM | POA: Diagnosis not present

## 2020-09-24 DIAGNOSIS — K2950 Unspecified chronic gastritis without bleeding: Secondary | ICD-10-CM | POA: Diagnosis not present

## 2020-09-24 DIAGNOSIS — Z87891 Personal history of nicotine dependence: Secondary | ICD-10-CM

## 2020-09-24 DIAGNOSIS — K8020 Calculus of gallbladder without cholecystitis without obstruction: Secondary | ICD-10-CM | POA: Diagnosis not present

## 2020-09-24 DIAGNOSIS — I10 Essential (primary) hypertension: Secondary | ICD-10-CM | POA: Diagnosis not present

## 2020-09-24 DIAGNOSIS — Z9889 Other specified postprocedural states: Secondary | ICD-10-CM | POA: Diagnosis not present

## 2020-09-24 DIAGNOSIS — Z951 Presence of aortocoronary bypass graft: Secondary | ICD-10-CM | POA: Diagnosis not present

## 2020-09-24 DIAGNOSIS — I480 Paroxysmal atrial fibrillation: Secondary | ICD-10-CM | POA: Diagnosis not present

## 2020-09-24 DIAGNOSIS — Z8546 Personal history of malignant neoplasm of prostate: Secondary | ICD-10-CM | POA: Diagnosis not present

## 2020-09-24 DIAGNOSIS — K8042 Calculus of bile duct with acute cholecystitis without obstruction: Secondary | ICD-10-CM | POA: Diagnosis not present

## 2020-09-24 DIAGNOSIS — R1010 Upper abdominal pain, unspecified: Secondary | ICD-10-CM | POA: Diagnosis not present

## 2020-09-24 DIAGNOSIS — K819 Cholecystitis, unspecified: Secondary | ICD-10-CM | POA: Diagnosis not present

## 2020-09-24 DIAGNOSIS — Z809 Family history of malignant neoplasm, unspecified: Secondary | ICD-10-CM

## 2020-09-24 LAB — COMPREHENSIVE METABOLIC PANEL
ALT: 11 U/L (ref 0–44)
AST: 19 U/L (ref 15–41)
Albumin: 3.8 g/dL (ref 3.5–5.0)
Alkaline Phosphatase: 76 U/L (ref 38–126)
Anion gap: 10 (ref 5–15)
BUN: 32 mg/dL — ABNORMAL HIGH (ref 8–23)
CO2: 25 mmol/L (ref 22–32)
Calcium: 10.2 mg/dL (ref 8.9–10.3)
Chloride: 106 mmol/L (ref 98–111)
Creatinine, Ser: 1.73 mg/dL — ABNORMAL HIGH (ref 0.61–1.24)
GFR, Estimated: 38 mL/min — ABNORMAL LOW (ref >60)
Glucose, Bld: 122 mg/dL — ABNORMAL HIGH (ref 70–99)
Potassium: 4.7 mmol/L (ref 3.5–5.1)
Sodium: 141 mmol/L (ref 135–145)
Total Bilirubin: 0.4 mg/dL (ref 0.3–1.2)
Total Protein: 7 g/dL (ref 6.5–8.1)

## 2020-09-24 LAB — CBC WITH DIFFERENTIAL/PLATELET
Abs Immature Granulocytes: 0.04 10*3/uL (ref 0.00–0.07)
Basophils Absolute: 0.1 10*3/uL (ref 0.0–0.1)
Basophils Relative: 1 %
Eosinophils Absolute: 0.4 10*3/uL (ref 0.0–0.5)
Eosinophils Relative: 3 %
HCT: 45.4 % (ref 39.0–52.0)
Hemoglobin: 15 g/dL (ref 13.0–17.0)
Immature Granulocytes: 0 %
Lymphocytes Relative: 15 %
Lymphs Abs: 1.7 10*3/uL (ref 0.7–4.0)
MCH: 29.8 pg (ref 26.0–34.0)
MCHC: 33 g/dL (ref 30.0–36.0)
MCV: 90.3 fL (ref 80.0–100.0)
Monocytes Absolute: 1 10*3/uL (ref 0.1–1.0)
Monocytes Relative: 9 %
Neutro Abs: 8.2 10*3/uL — ABNORMAL HIGH (ref 1.7–7.7)
Neutrophils Relative %: 72 %
Platelets: 197 10*3/uL (ref 150–400)
RBC: 5.03 MIL/uL (ref 4.22–5.81)
RDW: 14.5 % (ref 11.5–15.5)
WBC: 11.4 10*3/uL — ABNORMAL HIGH (ref 4.0–10.5)
nRBC: 0 % (ref 0.0–0.2)

## 2020-09-24 LAB — TROPONIN I (HIGH SENSITIVITY)
Troponin I (High Sensitivity): 9 ng/L (ref <18)
Troponin I (High Sensitivity): 12 ng/L (ref <18)

## 2020-09-24 LAB — RESP PANEL BY RT-PCR (FLU A&B, COVID) ARPGX2
Influenza A by PCR: NEGATIVE
Influenza B by PCR: NEGATIVE
SARS Coronavirus 2 by RT PCR: NEGATIVE

## 2020-09-24 LAB — LIPASE, BLOOD: Lipase: 42 U/L (ref 11–51)

## 2020-09-24 MED ORDER — MORPHINE SULFATE (PF) 4 MG/ML IV SOLN
4.0000 mg | Freq: Once | INTRAVENOUS | Status: AC
  Administered 2020-09-24: 4 mg via INTRAVENOUS
  Filled 2020-09-24: qty 1

## 2020-09-24 MED ORDER — SODIUM CHLORIDE 0.9 % IV BOLUS
250.0000 mL | Freq: Once | INTRAVENOUS | Status: AC
  Administered 2020-09-24: 250 mL via INTRAVENOUS

## 2020-09-24 MED ORDER — FENTANYL CITRATE (PF) 100 MCG/2ML IJ SOLN
50.0000 ug | Freq: Once | INTRAMUSCULAR | Status: AC
  Administered 2020-09-24: 50 ug via INTRAVENOUS
  Filled 2020-09-24: qty 2

## 2020-09-24 MED ORDER — IOHEXOL 300 MG/ML  SOLN
100.0000 mL | Freq: Once | INTRAMUSCULAR | Status: AC
  Administered 2020-09-24: 80 mL via INTRAVENOUS

## 2020-09-24 NOTE — ED Provider Notes (Incomplete)
[facility] EMERGENCY DEPARTMENT Provider Note   CSN: [id number] Arrival date & time: 09/24/20  1832     History Chief Complaint  Patient presents with  . Abdominal Pain    Juan Benjamin is a 87 y.o. male with a past medical history of prostate cancer, CAD, carotid artery occlusion, hypertension, hyperlipidemia, who presents today for evaluation of about 3 hours of abdominal pain.  He states that he felt fine this morning, after church was eating a peanut butter and banana sandwich and about 2 hours later had onset of periumbilical abdominal pain.  The pain has been staying the same since it started, has not radiated or moved.  No nausea vomiting or diarrhea.  He denies any urinary symptoms.  No recent constipation or diarrhea.  No aggravating or alleviating symptoms noted.  HPI     Past Medical History:  Diagnosis Date  . Benign tumor    gland hyperplasia  . Carotid artery occlusion    Chronic occlusion of left, R CEA  . Constipation   . Coronary artery disease   . GERD (gastroesophageal reflux disease)   . Heart murmur   . Hyperlipidemia   . Hypertension   . Hypothyroidism   . Pneumonia   . Prostate cancer [facility])     Patient Active Problem List   Diagnosis Date Noted  . Educated about COVID-19 virus infection 02/22/2020  . Murmur 12/13/2010  . Coronary artery disease   . Hypertension   . Carotid artery occlusion   . Hyperlipidemia     Past Surgical History:  Procedure Laterality Date  . CAROTID ARTERY SURGERY     Right   . CORONARY ARTERY BYPASS GRAFT     CABG (4 vessel, 2000.  No report in [place])  . MELANOMA EXCISION WITH SENTINEL LYMPH NODE BIOPSY Left 03/21/2020   Procedure: WIDE LOCAL EXCISION LEFT UPPER ARM MELANOMA ADVANCEMENT FLAP CLOSURE FOR DEFECT 4X8CM SENTINEL LYMPH NODE MAPPING AND BIOPSY;  Surgeon: Stark Klein, MD;  Location: [place];  Service: General;  Laterality: Left;  . RENAL ARTERY STENT  07/02/1999   RIGHT SIDE       Family  History  Problem Relation Age of Onset  . Bone cancer Mother     Social History   Tobacco Use  . Smoking status: Former Smoker    Quit date: 12/12/1980    Years since quitting: 39.8  . Smokeless tobacco: Never Used  Vaping Use  . Vaping Use: Never used  Substance Use Topics  . Alcohol use: No  . Drug use: No    Home Medications Prior to Admission medications   Medication Sig Start Date End Date Taking? Authorizing Provider  acetaminophen (TYLENOL) 325 MG tablet Take 325-650 mg by mouth every 6 (six) hours as needed for moderate pain (ear pain).     [provider]  allopurinol (ZYLOPRIM) 100 MG tablet Take 100 mg by mouth at bedtime.     [provider]  amLODipine (NORVASC) 5 MG tablet Take 5 mg by mouth daily.  12/16/16   [provider]  aspirin 81 MG tablet Take 1 tablet (81 mg total) by mouth daily. 02/24/20   Minus Breeding, MD  benazepril (LOTENSIN) 10 MG tablet Take 10 mg by mouth daily.     [provider]  Cholecalciferol (VITAMIN D-3) 125 MCG (5000 UT) TABS Take 5,000 Units by mouth daily.    [provider]  EPINEPHrine 0.3 mg/0.3 mL IJ SOAJ injection Inject 0.3 mLs (  0.3 mg total) into the muscle as needed for anaphylaxis. 12/16/19   Domenic Moras, PA-C  ibuprofen (ADVIL,MOTRIN) 200 MG tablet Take 200 mg by mouth every 6 (six) hours as needed for mild pain or moderate pain (ear pain).     [provider]  levothyroxine (SYNTHROID) 88 MCG tablet Take 88 mcg by mouth daily before breakfast.     [provider]  multivitamin-lutein (OCUVITE-LUTEIN) CAPS capsule Take 1 capsule by mouth daily.    [provider]  oxyCODONE (OXY IR/ROXICODONE) 5 MG immediate release tablet Take 0.5-1 tablets (2.5-5 mg total) by mouth every 6 (six) hours as needed for severe pain. 03/21/20   Stark Klein, MD  propranolol (INDERAL) 40 MG tablet Take 40 mg by mouth daily.      [provider]  rosuvastatin (CRESTOR) 20  MG tablet Take 1 tablet (20 mg total) by mouth daily. 03/17/20 06/15/20  Minus Breeding, MD  vitamin B-12 (CYANOCOBALAMIN) 1000 MCG tablet Take 1,000 mcg by mouth daily.    [provider]  zinc oxide (BALMEX) 11.3 % CREA cream Apply 1 application topically daily as needed (pressure spot).    [provider]  ranitidine (ZANTAC) 150 MG tablet Take 1 tablet (150 mg total) by mouth 2 (two) times daily. Patient not taking: Reported on 01/20/2017 11/28/16 12/16/19  Palumbo, April, MD    Allergies    Wasp venom, Bee venom, Crestor [rosuvastatin], Gemfibrozil, Indomethacin, Lipitor [atorvastatin], Lisinopril, Rofecoxib, Sildenafil, and Simvastatin  Review of Systems   Review of Systems  Constitutional: Negative for chills and fever.  HENT: Negative for congestion.   Respiratory: Negative for cough, chest tightness and shortness of breath.   Cardiovascular: Negative for chest pain.  Gastrointestinal: Positive for abdominal pain. Negative for constipation, diarrhea, nausea and vomiting.  Genitourinary: Negative for dysuria.  Musculoskeletal: Negative for back pain and neck pain.  Neurological: Negative for weakness and headaches.  All other systems reviewed and are negative.   Physical Exam Updated Vital Signs BP (!) 146/71   Pulse 94   Temp 98 F (36.7 C) (Oral)   Resp 18   Ht 5\' 7"  (1.702 m)   Wt 65.8 kg   SpO2 98%   BMI 22.71 kg/m   Physical Exam Vitals and nursing note reviewed.  Constitutional:      Appearance: He is not diaphoretic.     Comments: Appears uncomfortable  HENT:     Head: Normocephalic and atraumatic.  Eyes:     General: No scleral icterus.       Right eye: No discharge.        Left eye: No discharge.     Conjunctiva/sclera: Conjunctivae normal.  Cardiovascular:     Rate and Rhythm: Normal rate and regular rhythm.     Pulses:          Radial pulses are 2+ on the right side and 2+ on the left side.       Dorsalis pedis pulses are 1+ on the  right side and 1+ on the left side.       Posterior tibial pulses are 1+ on the right side and 1+ on the left side.     Heart sounds: Murmur heard.    Pulmonary:     Effort: Pulmonary effort is normal. No respiratory distress.     Breath sounds: No stridor.  Abdominal:     General: Bowel sounds are normal. There is no distension.     Palpations: Abdomen is soft.  Tenderness: There is abdominal tenderness in the epigastric area and periumbilical area.     Hernia: No hernia is present.  Musculoskeletal:        General: No deformity.     Cervical back: Normal range of motion.     Right lower leg: No edema.     Left lower leg: No edema.  Skin:    General: Skin is warm and dry.  Neurological:     Mental Status: He is alert.     Motor: No abnormal muscle tone.  Psychiatric:        Behavior: Behavior normal.     ED Results / Procedures / Treatments   Labs (all labs ordered are listed, but only abnormal results are displayed) Labs Reviewed  COMPREHENSIVE METABOLIC PANEL - Abnormal; Notable for the following components:      Result Value   Glucose, Bld 122 (*)    BUN 32 (*)    Creatinine, Ser 1.73 (*)    GFR, Estimated 38 (*)    All other components within normal limits  CBC WITH DIFFERENTIAL/PLATELET - Abnormal; Notable for the following components:   WBC 11.4 (*)    Neutro Abs 8.2 (*)    All other components within normal limits  RESP PANEL BY RT-PCR (FLU A&B, COVID) ARPGX2  LIPASE, BLOOD  URINALYSIS, ROUTINE W REFLEX MICROSCOPIC  TROPONIN I (HIGH SENSITIVITY)  TROPONIN I (HIGH SENSITIVITY)    EKG None  Radiology CT Abdomen Pelvis W Contrast  Result Date: 09/24/2020 CLINICAL DATA:  Periumbilical abdominal pain. EXAM: CT ABDOMEN AND PELVIS WITH CONTRAST TECHNIQUE: Multidetector CT imaging of the abdomen and pelvis was performed using the standard protocol following bolus administration of intravenous contrast. CONTRAST:  63mL OMNIPAQUE IOHEXOL 300 MG/ML  SOLN  COMPARISON:  None. FINDINGS: Lower chest: The lung bases are clear. The heart is enlarged. Hepatobiliary: The liver is normal. The gallbladder is distended.There appears to be mild intrahepatic and extrahepatic biliary ductal dilatation with the common bile duct measuring up to approximately 1.1 cm in diameter. Pancreas: Normal contours without ductal dilatation. No peripancreatic fluid collection. Spleen: Unremarkable. Adrenals/Urinary Tract: --Adrenal glands: Unremarkable. --Right kidney/ureter: The right kidney is atrophic with multiple simple appearing cysts. --Left kidney/ureter: There are multiple peripelvic cysts and extrarenal pelvis involving the left kidney. There is no evidence for an obstructing stone or hydronephrosis. --Urinary bladder: Unremarkable. Stomach/Bowel: --Stomach/Duodenum: There is moderate distention of the stomach. --Small bowel: Unremarkable. --Colon: Unremarkable. --Appendix: Normal. Vascular/Lymphatic: There are atherosclerotic changes of the abdominal aorta with aneurysmal dilatation of the infrarenal abdominal aorta. There are tandem fusiform aneurysms. The proximal measures approximately 3.8 x 3.5 cm. The distal aneurysm measures approximately 3.8 x 3.8 cm and extends into the left common iliac artery where there is aneurysmal dilatation measuring approximately 3.9 cm. There is a high-grade stenosis of the proximal right common iliac artery. There are atherosclerotic changes throughout the remaining visualized vascular structures including a high-grade stenosis of the right common femoral artery and proximal right superficial femoral artery. --No retroperitoneal lymphadenopathy. --No mesenteric lymphadenopathy. --No pelvic or inguinal lymphadenopathy. Reproductive: Unremarkable Other: No ascites or free air. The abdominal wall is normal. Musculoskeletal. No acute displaced fractures. IMPRESSION: 1. Moderate distention of the stomach without evidence for an identifiable cause. 2.  Distended gallbladder with mild biliary ductal dilatation. Correlation with laboratory studies is recommended. Consider further evaluation by ultrasound as clinically indicated. 3. Abdominal aortic aneurysms as detailed above. Recommend follow-up ultrasound every 2 years. This recommendation follows ACR consensus guidelines:  White Paper of the ACR Incidental Findings Committee II on Vascular Findings. J Am Coll Radiol 2013; 10:789-794. 4. 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 Outpatient surgical consultation is recommended. 5. Additional vascular disease as detailed above. 6. Atrophic right kidney. Aortic Atherosclerosis (ICD10-I70.0). Electronically Signed   By: Constance Holster M.D.   On: 09/24/2020 21:26   DG Chest Port 1 View  Result Date: 09/24/2020 CLINICAL DATA:  Postprandial upper abdominal pain EXAM: PORTABLE CHEST 1 VIEW COMPARISON:  11/06/2008 FINDINGS: Single frontal view of the chest demonstrates postsurgical changes from CABG. Cardiac silhouette is unremarkable. Chronic scarring within the left mid and lower lung zones. No acute airspace disease, effusion, or pneumothorax. No acute bony abnormalities. IMPRESSION: 1. No acute intrathoracic process. Electronically Signed   By: Randa Ngo M.D.   On: 09/24/2020 19:48    Procedures .Critical Care Performed by: Lorin Glass, PA-C Authorized by: Lorin Glass, PA-C   Critical care provider statement:    Critical care time (minutes):  45   Critical care time was exclusive of:  Separately billable procedures and treating other patients and teaching time   Critical care was necessary to treat or prevent imminent or life-threatening deterioration of the following conditions:  Circulatory failure   Critical care was time spent personally by me on the following activities:  Discussions with consultants, evaluation of patient's response to treatment, examination of patient, ordering and performing treatments and  interventions, ordering and review of laboratory studies, ordering and review of radiographic studies, pulse oximetry, re-evaluation of patient's condition, obtaining history from patient or surrogate and review of old charts   {Remember to document critical care time when appropriate:1}  Medications Ordered in ED Medications  fentaNYL (SUBLIMAZE) injection 50 mcg (50 mcg Intravenous Given 09/24/20 1928)  sodium chloride 0.9 % bolus 250 mL ( Intravenous Stopped 09/24/20 2206)  morphine 4 MG/ML injection 4 mg (4 mg Intravenous Given 09/24/20 2037)  iohexol (OMNIPAQUE) 300 MG/ML solution 100 mL (80 mLs Intravenous Contrast Given 09/24/20 2043)  morphine 4 MG/ML injection 4 mg (4 mg Intravenous Given 09/24/20 2127)    ED Course  I have reviewed the triage vital signs and the nursing notes.  Pertinent labs & imaging results that were available during my care of the patient were reviewed by me and considered in my medical decision making (see chart for details).  Clinical Course as of 09/24/20 2252  Sun Sep 24, 2020  2113 CT Abdomen Pelvis W Contrast On my view concerning for aortic aneurysm versus dissection.  This appears to be in the area of his pain. [EH]  2129 Ordered CTA dissection, was informed that based on his kidney function could not do it.   [EH]  2145 I spoke with Dr. Regenia Skeeter at [facility] ED who will accept patient in transfer.   [EH]    Clinical Course User Index [EH] Ollen Gross   MDM Rules/Calculators/A&P                         Patient is a 87 year old man who presents today for evaluation of 3 hours of abdominal pain. On my exam he appears uncomfortable.  Labs are obtained and reviewed, white count is slightly elevated at 11.4.  He is not anemic.  CMP shows creatinine is slightly elevated at 1.73.  GFR is 38.  Has appears to be slightly worse from his baseline. Troponin x2 is not elevated, lipase is normal.  Normal  hepatic function on labs. EKG without evidence of  acute ischemia. CT abdomen pelvis is obtained, this appears concerning for aortic aneurysm versus dissection. I called [facility]ascular and spoke with Dr. Trula Slade who requested ED to ED transfer for evaluation.  On my exam it appears the area of the aneurysm is where his pain is.  I spoke with Dr. Regenia Skeeter at the [facility] who accepts the patient in   *** Final Clinical Impression(s) / ED Diagnoses Final diagnoses:  Periumbilical abdominal pain  Abdominal aortic aneurysm (AAA) without rupture ([place])    Rx / DC Orders ED Discharge Orders    None

## 2020-09-24 NOTE — ED Notes (Signed)
Wyn Quaker made aware of pt's lab values being so that we have to give reduced dose. Also made aware that in comparison to recent hx of labs, kidney function getting worse. Pt given reduced dose per radiology protocol with approval by Wyn Quaker, PA.

## 2020-09-24 NOTE — ED Notes (Signed)
Pt arrives from [place] in  [facility] for imaging and vascular consult regarding dilated AAA to r/o dissection. Given 8mg  morphine andn 63mcg fentanyl. Pt is extremely HOH.

## 2020-09-24 NOTE — ED Notes (Signed)
O2 2L [place] placed after Morphine administration

## 2020-09-24 NOTE — ED Provider Notes (Signed)
[facility] EMERGENCY DEPARTMENT Provider Note   CSN: [id number] Arrival date & time: 09/24/20  1832     History Chief Complaint  Patient presents with  . Abdominal Pain    Juan Benjamin is a 87 y.o. male with a past medical history of prostate cancer, CAD, carotid artery occlusion, hypertension, hyperlipidemia, who presents today for evaluation of about 3 hours of abdominal pain.  He states that he felt fine this morning, after church was eating a peanut butter and banana sandwich and about 2 hours later had onset of periumbilical abdominal pain.  The pain has been staying the same since it started, has not radiated or moved.  No nausea vomiting or diarrhea.  He denies any urinary symptoms.  No recent constipation or diarrhea.  No aggravating or alleviating symptoms noted.  HPI     Past Medical History:  Diagnosis Date  . Benign tumor    gland hyperplasia  . Carotid artery occlusion    Chronic occlusion of left, R CEA  . Constipation   . Coronary artery disease   . GERD (gastroesophageal reflux disease)   . Heart murmur   . Hyperlipidemia   . Hypertension   . Hypothyroidism   . Pneumonia   . Prostate cancer [facility])     Patient Active Problem List   Diagnosis Date Noted  . Educated about COVID-19 virus infection 02/22/2020  . Murmur 12/13/2010  . Coronary artery disease   . Hypertension   . Carotid artery occlusion   . Hyperlipidemia     Past Surgical History:  Procedure Laterality Date  . CAROTID ARTERY SURGERY     Right   . CORONARY ARTERY BYPASS GRAFT     CABG (4 vessel, 2000.  No report in [place])  . MELANOMA EXCISION WITH SENTINEL LYMPH NODE BIOPSY Left 03/21/2020   Procedure: WIDE LOCAL EXCISION LEFT UPPER ARM MELANOMA ADVANCEMENT FLAP CLOSURE FOR DEFECT 4X8CM SENTINEL LYMPH NODE MAPPING AND BIOPSY;  Surgeon: Stark Klein, MD;  Location: [place];  Service: General;  Laterality: Left;  . RENAL ARTERY STENT  07/02/1999   RIGHT SIDE       Family  History  Problem Relation Age of Onset  . Bone cancer Mother     Social History   Tobacco Use  . Smoking status: Former Smoker    Quit date: 12/12/1980    Years since quitting: 39.8  . Smokeless tobacco: Never Used  Vaping Use  . Vaping Use: Never used  Substance Use Topics  . Alcohol use: No  . Drug use: No    Home Medications Prior to Admission medications   Medication Sig Start Date End Date Taking? Authorizing Provider  acetaminophen (TYLENOL) 325 MG tablet Take 325-650 mg by mouth every 6 (six) hours as needed for moderate pain (ear pain).     [provider]  allopurinol (ZYLOPRIM) 100 MG tablet Take 100 mg by mouth at bedtime.     [provider]  amLODipine (NORVASC) 5 MG tablet Take 5 mg by mouth daily.  12/16/16   [provider]  aspirin 81 MG tablet Take 1 tablet (81 mg total) by mouth daily. 02/24/20   Minus Breeding, MD  benazepril (LOTENSIN) 10 MG tablet Take 10 mg by mouth daily.     [provider]  Cholecalciferol (VITAMIN D-3) 125 MCG (5000 UT) TABS Take 5,000 Units by mouth daily.    [provider]  EPINEPHrine 0.3 mg/0.3 mL IJ SOAJ injection Inject 0.3 mLs (  0.3 mg total) into the muscle as needed for anaphylaxis. 12/16/19   Domenic Moras, PA-C  ibuprofen (ADVIL,MOTRIN) 200 MG tablet Take 200 mg by mouth every 6 (six) hours as needed for mild pain or moderate pain (ear pain).     [provider]  levothyroxine (SYNTHROID) 88 MCG tablet Take 88 mcg by mouth daily before breakfast.     [provider]  multivitamin-lutein (OCUVITE-LUTEIN) CAPS capsule Take 1 capsule by mouth daily.    [provider]  oxyCODONE (OXY IR/ROXICODONE) 5 MG immediate release tablet Take 0.5-1 tablets (2.5-5 mg total) by mouth every 6 (six) hours as needed for severe pain. 03/21/20   Stark Klein, MD  propranolol (INDERAL) 40 MG tablet Take 40 mg by mouth daily.      [provider]  rosuvastatin (CRESTOR) 20  MG tablet Take 1 tablet (20 mg total) by mouth daily. 03/17/20 06/15/20  Minus Breeding, MD  vitamin B-12 (CYANOCOBALAMIN) 1000 MCG tablet Take 1,000 mcg by mouth daily.    [provider]  zinc oxide (BALMEX) 11.3 % CREA cream Apply 1 application topically daily as needed (pressure spot).    [provider]  ranitidine (ZANTAC) 150 MG tablet Take 1 tablet (150 mg total) by mouth 2 (two) times daily. Patient not taking: Reported on 01/20/2017 11/28/16 12/16/19  Palumbo, April, MD    Allergies    Wasp venom, Bee venom, Crestor [rosuvastatin], Gemfibrozil, Indomethacin, Lipitor [atorvastatin], Lisinopril, Rofecoxib, Sildenafil, and Simvastatin  Review of Systems   Review of Systems  Constitutional: Negative for chills and fever.  HENT: Negative for congestion.   Respiratory: Negative for cough, chest tightness and shortness of breath.   Cardiovascular: Negative for chest pain.  Gastrointestinal: Positive for abdominal pain. Negative for constipation, diarrhea, nausea and vomiting.  Genitourinary: Negative for dysuria.  Musculoskeletal: Negative for back pain and neck pain.  Neurological: Negative for weakness and headaches.  All other systems reviewed and are negative.   Physical Exam Updated Vital Signs BP (!) 146/71   Pulse 94   Temp 98 F (36.7 C) (Oral)   Resp 18   Ht 5\' 7"  (1.702 m)   Wt 65.8 kg   SpO2 98%   BMI 22.71 kg/m   Physical Exam Vitals and nursing note reviewed.  Constitutional:      Appearance: He is not diaphoretic.     Comments: Appears uncomfortable  HENT:     Head: Normocephalic and atraumatic.  Eyes:     General: No scleral icterus.       Right eye: No discharge.        Left eye: No discharge.     Conjunctiva/sclera: Conjunctivae normal.  Cardiovascular:     Rate and Rhythm: Normal rate and regular rhythm.     Pulses:          Radial pulses are 2+ on the right side and 2+ on the left side.       Dorsalis pedis pulses are 1+ on the  right side and 1+ on the left side.       Posterior tibial pulses are 1+ on the right side and 1+ on the left side.     Heart sounds: Murmur heard.    Pulmonary:     Effort: Pulmonary effort is normal. No respiratory distress.     Breath sounds: No stridor.  Abdominal:     General: Bowel sounds are normal. There is no distension.     Palpations: Abdomen is soft.  Tenderness: There is abdominal tenderness in the epigastric area and periumbilical area.     Hernia: No hernia is present.  Musculoskeletal:        General: No deformity.     Cervical back: Normal range of motion.     Right lower leg: No edema.     Left lower leg: No edema.  Skin:    General: Skin is warm and dry.  Neurological:     Mental Status: He is alert.     Motor: No abnormal muscle tone.  Psychiatric:        Behavior: Behavior normal.     ED Results / Procedures / Treatments   Labs (all labs ordered are listed, but only abnormal results are displayed) Labs Reviewed  COMPREHENSIVE METABOLIC PANEL - Abnormal; Notable for the following components:      Result Value   Glucose, Bld 122 (*)    BUN 32 (*)    Creatinine, Ser 1.73 (*)    GFR, Estimated 38 (*)    All other components within normal limits  CBC WITH DIFFERENTIAL/PLATELET - Abnormal; Notable for the following components:   WBC 11.4 (*)    Neutro Abs 8.2 (*)    All other components within normal limits  RESP PANEL BY RT-PCR (FLU A&B, COVID) ARPGX2  LIPASE, BLOOD  URINALYSIS, ROUTINE W REFLEX MICROSCOPIC  TROPONIN I (HIGH SENSITIVITY)  TROPONIN I (HIGH SENSITIVITY)    EKG None  Radiology CT Abdomen Pelvis W Contrast  Result Date: 09/24/2020 CLINICAL DATA:  Periumbilical abdominal pain. EXAM: CT ABDOMEN AND PELVIS WITH CONTRAST TECHNIQUE: Multidetector CT imaging of the abdomen and pelvis was performed using the standard protocol following bolus administration of intravenous contrast. CONTRAST:  63mL OMNIPAQUE IOHEXOL 300 MG/ML  SOLN  COMPARISON:  None. FINDINGS: Lower chest: The lung bases are clear. The heart is enlarged. Hepatobiliary: The liver is normal. The gallbladder is distended.There appears to be mild intrahepatic and extrahepatic biliary ductal dilatation with the common bile duct measuring up to approximately 1.1 cm in diameter. Pancreas: Normal contours without ductal dilatation. No peripancreatic fluid collection. Spleen: Unremarkable. Adrenals/Urinary Tract: --Adrenal glands: Unremarkable. --Right kidney/ureter: The right kidney is atrophic with multiple simple appearing cysts. --Left kidney/ureter: There are multiple peripelvic cysts and extrarenal pelvis involving the left kidney. There is no evidence for an obstructing stone or hydronephrosis. --Urinary bladder: Unremarkable. Stomach/Bowel: --Stomach/Duodenum: There is moderate distention of the stomach. --Small bowel: Unremarkable. --Colon: Unremarkable. --Appendix: Normal. Vascular/Lymphatic: There are atherosclerotic changes of the abdominal aorta with aneurysmal dilatation of the infrarenal abdominal aorta. There are tandem fusiform aneurysms. The proximal measures approximately 3.8 x 3.5 cm. The distal aneurysm measures approximately 3.8 x 3.8 cm and extends into the left common iliac artery where there is aneurysmal dilatation measuring approximately 3.9 cm. There is a high-grade stenosis of the proximal right common iliac artery. There are atherosclerotic changes throughout the remaining visualized vascular structures including a high-grade stenosis of the right common femoral artery and proximal right superficial femoral artery. --No retroperitoneal lymphadenopathy. --No mesenteric lymphadenopathy. --No pelvic or inguinal lymphadenopathy. Reproductive: Unremarkable Other: No ascites or free air. The abdominal wall is normal. Musculoskeletal. No acute displaced fractures. IMPRESSION: 1. Moderate distention of the stomach without evidence for an identifiable cause. 2.  Distended gallbladder with mild biliary ductal dilatation. Correlation with laboratory studies is recommended. Consider further evaluation by ultrasound as clinically indicated. 3. Abdominal aortic aneurysms as detailed above. Recommend follow-up ultrasound every 2 years. This recommendation follows ACR consensus guidelines:  White Paper of the ACR Incidental Findings Committee II on Vascular Findings. J Am Coll Radiol 2013; 10:789-794. 4. 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 Outpatient surgical consultation is recommended. 5. Additional vascular disease as detailed above. 6. Atrophic right kidney. Aortic Atherosclerosis (ICD10-I70.0). Electronically Signed   By: Constance Holster M.D.   On: 09/24/2020 21:26   DG Chest Port 1 View  Result Date: 09/24/2020 CLINICAL DATA:  Postprandial upper abdominal pain EXAM: PORTABLE CHEST 1 VIEW COMPARISON:  11/06/2008 FINDINGS: Single frontal view of the chest demonstrates postsurgical changes from CABG. Cardiac silhouette is unremarkable. Chronic scarring within the left mid and lower lung zones. No acute airspace disease, effusion, or pneumothorax. No acute bony abnormalities. IMPRESSION: 1. No acute intrathoracic process. Electronically Signed   By: Randa Ngo M.D.   On: 09/24/2020 19:48    Procedures .Critical Care Performed by: Lorin Glass, PA-C Authorized by: Lorin Glass, PA-C   Critical care provider statement:    Critical care time (minutes):  45   Critical care time was exclusive of:  Separately billable procedures and treating other patients and teaching time   Critical care was necessary to treat or prevent imminent or life-threatening deterioration of the following conditions:  Circulatory failure   Critical care was time spent personally by me on the following activities:  Discussions with consultants, evaluation of patient's response to treatment, examination of patient, ordering and performing treatments and  interventions, ordering and review of laboratory studies, ordering and review of radiographic studies, pulse oximetry, re-evaluation of patient's condition, obtaining history from patient or surrogate and review of old charts     Medications Ordered in ED Medications  fentaNYL (SUBLIMAZE) injection 50 mcg (50 mcg Intravenous Given 09/24/20 1928)  sodium chloride 0.9 % bolus 250 mL ( Intravenous Stopped 09/24/20 2206)  morphine 4 MG/ML injection 4 mg (4 mg Intravenous Given 09/24/20 2037)  iohexol (OMNIPAQUE) 300 MG/ML solution 100 mL (80 mLs Intravenous Contrast Given 09/24/20 2043)  morphine 4 MG/ML injection 4 mg (4 mg Intravenous Given 09/24/20 2127)    ED Course  I have reviewed the triage vital signs and the nursing notes.  Pertinent labs & imaging results that were available during my care of the patient were reviewed by me and considered in my medical decision making (see chart for details).  Clinical Course as of 09/24/20 2252  Sun Sep 24, 2020  2113 CT Abdomen Pelvis W Contrast On my view concerning for aortic aneurysm versus dissection.  This appears to be in the area of his pain. [EH]  2129 Ordered CTA dissection, was informed that based on his kidney function could not do it.   [EH]  2145 I spoke with Dr. Regenia Skeeter at [facility] ED who will accept patient in transfer.   [EH]    Clinical Course User Index [EH] Ollen Gross   MDM Rules/Calculators/A&P                         Patient is a 87 year old man who presents today for evaluation of 3 hours of abdominal pain. On my exam he appears uncomfortable.  Labs are obtained and reviewed, white count is slightly elevated at 11.4.  He is not anemic.  CMP shows creatinine is slightly elevated at 1.73.  GFR is 38.  Has appears to be slightly worse from his baseline. Troponin x2 is not elevated, lipase is normal.  Normal hepatic function on labs. EKG without evidence  of acute ischemia. CT abdomen pelvis is obtained, this appears  concerning for aortic aneurysm versus dissection. I called [facility] and spoke with Dr. Trula Slade who requested ED to ED transfer for evaluation.  On my exam it appears the area of the aneurysm is where his pain is.  I spoke with Dr. Regenia Skeeter at the [facility] who accepts the patient in transfer by carelink.  Patient transferred to [facility].   Note: Portions of this report may have been transcribed using voice recognition software. Every effort was made to ensure accuracy; however, inadvertent computerized transcription errors may be present.    Final Clinical Impression(s) / ED Diagnoses Final diagnoses:  Periumbilical abdominal pain  Abdominal aortic aneurysm (AAA) without rupture [facility])    Rx / DC Orders ED Discharge Orders    None       Ollen Gross 09/25/20 0019    Breck Coons, MD 09/25/20 [phone]

## 2020-09-24 NOTE — ED Notes (Signed)
Pt was given a urinal and was told to call when he has a urine sample.

## 2020-09-24 NOTE — ED Triage Notes (Signed)
Reports mid/upper abdominal pain that came on suddenly today after eating peanut butter and banana sandwich.  Denies any n/v/d.

## 2020-09-24 NOTE — ED Notes (Addendum)
Arrives to room via [facility]. Pt brought from [facility] for possible dissecting AAA. Here for vas surgery consult. Pt @ this time HOH, but A/0 x4. When asked pain @ this time 0-10states "normal" @ back. o2 sat 95% on RA. On Cardiac monitoring devices, cont pulse ox, and cycling bp. Assisted to use urinal, but did not have to void. Pt awaiting eval by ED physician. VSS, NAD @ this time. Bed low and locked. Call bell in reach.

## 2020-09-24 NOTE — ED Notes (Signed)
Vas surgery @ bedside.

## 2020-09-24 NOTE — ED Notes (Signed)
Pt was able to use urinal independently. UA obtained. Family member @ bedside.

## 2020-09-25 ENCOUNTER — Emergency Department (HOSPITAL_COMMUNITY): Payer: PPO

## 2020-09-25 ENCOUNTER — Observation Stay (HOSPITAL_BASED_OUTPATIENT_CLINIC_OR_DEPARTMENT_OTHER): Payer: PPO

## 2020-09-25 DIAGNOSIS — K828 Other specified diseases of gallbladder: Secondary | ICD-10-CM

## 2020-09-25 DIAGNOSIS — I714 Abdominal aortic aneurysm, without rupture: Secondary | ICD-10-CM

## 2020-09-25 DIAGNOSIS — R1011 Right upper quadrant pain: Secondary | ICD-10-CM

## 2020-09-25 DIAGNOSIS — I4891 Unspecified atrial fibrillation: Secondary | ICD-10-CM

## 2020-09-25 DIAGNOSIS — R109 Unspecified abdominal pain: Secondary | ICD-10-CM

## 2020-09-25 DIAGNOSIS — K810 Acute cholecystitis: Secondary | ICD-10-CM | POA: Diagnosis not present

## 2020-09-25 DIAGNOSIS — R1033 Periumbilical pain: Secondary | ICD-10-CM

## 2020-09-25 DIAGNOSIS — I10 Essential (primary) hypertension: Secondary | ICD-10-CM | POA: Diagnosis not present

## 2020-09-25 DIAGNOSIS — Z01818 Encounter for other preprocedural examination: Secondary | ICD-10-CM

## 2020-09-25 LAB — URINALYSIS, ROUTINE W REFLEX MICROSCOPIC
Bacteria, UA: NONE SEEN
Bilirubin Urine: NEGATIVE
Glucose, UA: NEGATIVE mg/dL
Ketones, ur: 5 mg/dL — AB
Leukocytes,Ua: NEGATIVE
Nitrite: NEGATIVE
Protein, ur: 100 mg/dL — AB
Specific Gravity, Urine: 1.021 (ref 1.005–1.030)
pH: 8 (ref 5.0–8.0)

## 2020-09-25 LAB — ECHOCARDIOGRAM COMPLETE
AR max vel: 1.14 cm2
AV Area VTI: 1.14 cm2
AV Area mean vel: 1.05 cm2
AV Mean grad: 29 mmHg
AV Peak grad: 48 mmHg
Ao pk vel: 3.46 m/s
Area-P 1/2: 2.73 cm2
Height: 67 in
S' Lateral: 3.2 cm
Weight: 2320 oz

## 2020-09-25 LAB — COMPREHENSIVE METABOLIC PANEL
ALT: 18 U/L (ref 0–44)
AST: 28 U/L (ref 15–41)
Albumin: 3.4 g/dL — ABNORMAL LOW (ref 3.5–5.0)
Alkaline Phosphatase: 74 U/L (ref 38–126)
Anion gap: 10 (ref 5–15)
BUN: 26 mg/dL — ABNORMAL HIGH (ref 8–23)
CO2: 20 mmol/L — ABNORMAL LOW (ref 22–32)
Calcium: 10.3 mg/dL (ref 8.9–10.3)
Chloride: 110 mmol/L (ref 98–111)
Creatinine, Ser: 1.48 mg/dL — ABNORMAL HIGH (ref 0.61–1.24)
GFR, Estimated: 46 mL/min — ABNORMAL LOW (ref >60)
Glucose, Bld: 167 mg/dL — ABNORMAL HIGH (ref 70–99)
Potassium: 4.5 mmol/L (ref 3.5–5.1)
Sodium: 140 mmol/L (ref 135–145)
Total Bilirubin: 1 mg/dL (ref 0.3–1.2)
Total Protein: 6.2 g/dL — ABNORMAL LOW (ref 6.5–8.1)

## 2020-09-25 LAB — CBC
HCT: 46.5 % (ref 39.0–52.0)
Hemoglobin: 14.7 g/dL (ref 13.0–17.0)
MCH: 29.1 pg (ref 26.0–34.0)
MCHC: 31.6 g/dL (ref 30.0–36.0)
MCV: 91.9 fL (ref 80.0–100.0)
Platelets: 184 10*3/uL (ref 150–400)
RBC: 5.06 MIL/uL (ref 4.22–5.81)
RDW: 14.2 % (ref 11.5–15.5)
WBC: 23.4 10*3/uL — ABNORMAL HIGH (ref 4.0–10.5)
nRBC: 0 % (ref 0.0–0.2)

## 2020-09-25 LAB — HEPARIN LEVEL (UNFRACTIONATED)
Heparin Unfractionated: 0.41 IU/mL (ref 0.30–0.70)
Heparin Unfractionated: 0.46 IU/mL (ref 0.30–0.70)

## 2020-09-25 MED ORDER — AMLODIPINE BESYLATE 5 MG PO TABS
5.0000 mg | ORAL_TABLET | Freq: Every day | ORAL | Status: DC
  Administered 2020-09-25 – 2020-09-28 (×4): 5 mg via ORAL
  Filled 2020-09-25 (×4): qty 1

## 2020-09-25 MED ORDER — ROSUVASTATIN CALCIUM 5 MG PO TABS
10.0000 mg | ORAL_TABLET | Freq: Every day | ORAL | Status: DC
  Administered 2020-09-25 – 2020-10-02 (×8): 10 mg via ORAL
  Filled 2020-09-25 (×9): qty 2

## 2020-09-25 MED ORDER — MORPHINE SULFATE (PF) 2 MG/ML IV SOLN
2.0000 mg | INTRAVENOUS | Status: DC | PRN
  Administered 2020-09-25 – 2020-10-01 (×12): 2 mg via INTRAVENOUS
  Filled 2020-09-25 (×14): qty 1

## 2020-09-25 MED ORDER — PROPRANOLOL HCL 40 MG PO TABS
40.0000 mg | ORAL_TABLET | Freq: Every day | ORAL | Status: DC
  Administered 2020-09-25 – 2020-10-03 (×10): 40 mg via ORAL
  Filled 2020-09-25 (×11): qty 1

## 2020-09-25 MED ORDER — ONDANSETRON HCL 4 MG/2ML IJ SOLN: 4.0000 mg | Freq: Four times a day (QID) | INTRAMUSCULAR | Status: DC | PRN

## 2020-09-25 MED ORDER — HYDRALAZINE HCL 20 MG/ML IJ SOLN
5.0000 mg | INTRAMUSCULAR | Status: DC | PRN
  Administered 2020-09-28: 5 mg via INTRAVENOUS
  Administered 2020-09-28: 10 mg via INTRAVENOUS
  Filled 2020-09-25 (×2): qty 1

## 2020-09-25 MED ORDER — MUPIROCIN 2 % EX OINT
1.0000 "application " | TOPICAL_OINTMENT | Freq: Two times a day (BID) | CUTANEOUS | Status: DC
  Administered 2020-09-26: 1 via NASAL
  Filled 2020-09-25: qty 22

## 2020-09-25 MED ORDER — HEPARIN (PORCINE) 25000 UT/250ML-% IV SOLN
900.0000 [IU]/h | INTRAVENOUS | Status: DC
  Administered 2020-09-25: 900 [IU]/h via INTRAVENOUS
  Filled 2020-09-25: qty 250

## 2020-09-25 MED ORDER — ACETAMINOPHEN 325 MG PO TABS: 325.0000 mg | ORAL_TABLET | Freq: Four times a day (QID) | ORAL | Status: DC | PRN

## 2020-09-25 MED ORDER — PROSIGHT PO TABS
1.0000 | ORAL_TABLET | Freq: Every day | ORAL | Status: DC
  Administered 2020-09-25 – 2020-10-04 (×9): 1 via ORAL
  Filled 2020-09-25 (×9): qty 1

## 2020-09-25 MED ORDER — LACTATED RINGERS IV SOLN: INTRAVENOUS | Status: DC

## 2020-09-25 MED ORDER — ONDANSETRON HCL 4 MG PO TABS: 4.0000 mg | ORAL_TABLET | Freq: Four times a day (QID) | ORAL | Status: DC | PRN

## 2020-09-25 MED ORDER — HEPARIN BOLUS VIA INFUSION
2000.0000 [IU] | Freq: Once | INTRAVENOUS | Status: AC
  Administered 2020-09-25: 2000 [IU] via INTRAVENOUS
  Filled 2020-09-25: qty 2000

## 2020-09-25 MED ORDER — ENOXAPARIN SODIUM 30 MG/0.3ML IJ SOSY: 30.0000 mg | PREFILLED_SYRINGE | INTRAMUSCULAR | Status: DC

## 2020-09-25 MED ORDER — PROPRANOLOL HCL 40 MG PO TABS: 40.0000 mg | ORAL_TABLET | Freq: Every day | ORAL | Status: DC

## 2020-09-25 MED ORDER — ACETAMINOPHEN 650 MG RE SUPP: 650.0000 mg | Freq: Four times a day (QID) | RECTAL | Status: DC | PRN

## 2020-09-25 MED ORDER — ASPIRIN EC 81 MG PO TBEC
81.0000 mg | DELAYED_RELEASE_TABLET | Freq: Every day | ORAL | Status: DC
  Administered 2020-09-25 – 2020-10-03 (×9): 81 mg via ORAL
  Filled 2020-09-25 (×9): qty 1

## 2020-09-25 MED ORDER — ACETAMINOPHEN 325 MG PO TABS
650.0000 mg | ORAL_TABLET | Freq: Four times a day (QID) | ORAL | Status: DC | PRN
  Administered 2020-09-26 – 2020-09-29 (×5): 650 mg via ORAL
  Filled 2020-09-25 (×5): qty 2

## 2020-09-25 MED ORDER — SODIUM CHLORIDE 0.9 % IV SOLN
2.0000 g | INTRAVENOUS | Status: DC
  Administered 2020-09-25 – 2020-10-03 (×8): 2 g via INTRAVENOUS
  Filled 2020-09-25 (×8): qty 20

## 2020-09-25 MED ORDER — LEVOTHYROXINE SODIUM 88 MCG PO TABS
88.0000 ug | ORAL_TABLET | Freq: Every day | ORAL | Status: DC
  Administered 2020-09-25 – 2020-10-04 (×9): 88 ug via ORAL
  Filled 2020-09-25 (×9): qty 1

## 2020-09-25 NOTE — ED Notes (Signed)
Family and pt updated on delay of care.

## 2020-09-25 NOTE — H&P (Signed)
History and Physical    Juan Benjamin [id number] DOB: Dec 05, 1930 DOA: 09/24/2020  PCP: Aletha Halim., PA-C  Patient coming from: Home  I have personally briefly reviewed patient's old medical records in [place]  Chief Complaint: Abd pain  HPI: Juan Benjamin is a 87 y.o. male with medical history significant of CAD, carotid artery dz s/p CEA, HTN.  Pt presents to the ED with c/o abd pain.  Pain onset 3 hours PTA.  This onset was 2h after eating a peanut butter and banana sandwich.  Pain unchanged since onset.  No movement.  Pain is in RUQ.  Pain is constant, moderate.  No N/V/D.  No urinary symptoms, no constipation, no CP.   ED Course: LFTs nl, Creat 1.7 up from his baseline of ~1.5  CT in ED: 1) ? 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  Pt transferred to [facility].  Dr. Trula Slade saw pt in consult, see his note, but in brief: 1) doesn't think abd pain due to AAA most likely 2) ? Cholecystitis as cause of RUQ abd pain.  Hospitalist asked to admit.   Review of Systems: As per HPI, otherwise all review of systems negative.  Past Medical History:  Diagnosis Date  . Benign tumor    gland hyperplasia  . Carotid artery occlusion    Chronic occlusion of left, R CEA  . Constipation   . Coronary artery disease   . GERD (gastroesophageal reflux disease)   . Heart murmur   . Hyperlipidemia   . Hypertension   . Hypothyroidism   . Pneumonia   . Prostate cancer [facility])     Past Surgical History:  Procedure Laterality Date  . CAROTID ARTERY SURGERY     Right   . CORONARY ARTERY BYPASS GRAFT     CABG (4 vessel, 2000.  No report in [place])  . MELANOMA EXCISION WITH SENTINEL LYMPH NODE BIOPSY Left 03/21/2020   Procedure: WIDE LOCAL EXCISION LEFT UPPER ARM MELANOMA ADVANCEMENT FLAP CLOSURE FOR DEFECT 4X8CM SENTINEL  LYMPH NODE MAPPING AND BIOPSY;  Surgeon: Stark Klein, MD;  Location: [place];  Service: General;  Laterality: Left;  . RENAL ARTERY STENT  07/02/1999   RIGHT SIDE     reports that he quit smoking about 39 years ago. He has never used smokeless tobacco. He reports that he does not drink alcohol and does not use drugs.  Allergies  Allergen Reactions  . Wasp Venom Anaphylaxis  . Bee Venom   . Crestor [Rosuvastatin]     myalgia  . Gemfibrozil Other (See Comments)    unknown  . Indomethacin Other (See Comments)    unknown  . Lipitor [Atorvastatin]     myalgia  . Lisinopril Cough  . Rofecoxib Other (See Comments)    unknown  . Sildenafil Other (See Comments)    unknown  . Simvastatin Other (See Comments)    unknown    Family History  Problem Relation Age of Onset  . Bone cancer Mother      Prior to Admission medications   Medication Sig Start Date End Date Taking? Authorizing Provider  acetaminophen (TYLENOL) 325 MG tablet Take 325-650 mg by mouth every 6 (six) hours as needed for moderate pain (ear pain).    Yes [provider]  allopurinol (ZYLOPRIM) 100 MG tablet Take 100 mg by mouth  at bedtime.    Yes [provider]  amLODipine (NORVASC) 5 MG tablet Take 5 mg by mouth daily.  12/16/16  Yes [provider]  aspirin 81 MG tablet Take 1 tablet (81 mg total) by mouth daily. Patient taking differently: Take 81 mg by mouth at bedtime. 02/24/20  Yes Minus Breeding, MD  benazepril (LOTENSIN) 10 MG tablet Take 10 mg by mouth at bedtime.   Yes [provider]  Cholecalciferol (VITAMIN D-3) 125 MCG (5000 UT) TABS Take 5,000 Units by mouth daily.   Yes [provider]  EPINEPHrine 0.3 mg/0.3 mL IJ SOAJ injection Inject 0.3 mLs (0.3 mg total) into the muscle as needed for anaphylaxis. 12/16/19  Yes Domenic Moras, PA-C  ibuprofen (ADVIL,MOTRIN) 200 MG tablet Take 200 mg by mouth every 6 (six) hours as needed for mild pain or moderate pain (ear  pain).    Yes [provider]  levothyroxine (SYNTHROID) 88 MCG tablet    Yes [provider]  multivitamin-lutein (OCUVITE-LUTEIN) CAPS capsule Take 1 capsule by mouth daily.   Yes [provider]  propranolol (INDERAL) 40 MG tablet Take 40 mg by mouth at bedtime.   Yes [provider]  rosuvastatin (CRESTOR) 20 MG tablet Take 1 tablet (20 mg total) by mouth daily. Patient taking differently: Take 10 mg by mouth at bedtime. 03/17/20 06/15/20 Yes HochreinJeneen Rinks, MD  vitamin B-12 (CYANOCOBALAMIN) 1000 MCG tablet Take 1,000 mcg by mouth daily.   Yes [provider]  zinc oxide (BALMEX) 11.3 % CREA cream Apply 1 application topically daily as needed (pressure spot).   Yes [provider]  oxyCODONE (OXY IR/ROXICODONE) 5 MG immediate release tablet Take 0.5-1 tablets (2.5-5 mg total) by mouth every 6 (six) hours as needed for severe pain. Patient not taking: Reported on 09/24/2020 03/21/20   Stark Klein, MD  ranitidine (ZANTAC) 150 MG tablet Take 1 tablet (150 mg total) by mouth 2 (two) times daily. Patient not taking: Reported on 01/20/2017 11/28/16 12/16/19  Veatrice Kells, MD    Physical Exam: Vitals:   09/25/20 0300 09/25/20 0315 09/25/20 0330 09/25/20 0408  BP: (!) 141/90 (!) 158/72 129/87 (!) 145/77  Pulse: 71 68 77 (!) 105  Resp: (!) 24     Temp:      TempSrc:      SpO2: (!) 84% 94% 95%   Weight:      Height:        Constitutional: NAD, calm, comfortable Eyes: PERRL, lids and conjunctivae normal ENMT: Mucous membranes are moist. Posterior pharynx clear of any exudate or lesions.Normal dentition.  Neck: normal, supple, no masses, no thyromegaly Respiratory: clear to auscultation bilaterally, no wheezing, no crackles. Normal respiratory effort. No accessory muscle use.  Cardiovascular: Regular rate and rhythm, no murmurs / rubs / gallops. No extremity edema. 2+ pedal pulses. No carotid bruits.  Abdomen: RUQ TTP Musculoskeletal: no  clubbing / cyanosis. No joint deformity upper and lower extremities. Good ROM, no contractures. Normal muscle tone.  Skin: no rashes, lesions, ulcers. No induration Neurologic: CN 2-12 grossly intact. Sensation intact, DTR normal. Strength 5/5 in all 4.  Psychiatric: Normal judgment and insight. Alert and oriented x 3. Normal mood.    Labs on Admission: I have personally reviewed following labs and imaging studies  CBC: Recent Labs  Lab 09/24/20 1932  WBC 11.4*  NEUTROABS 8.2*  HGB 15.0  HCT 45.4  MCV 90.3  PLT 408   Basic Metabolic Panel: Recent Labs  Lab 09/24/20  1932  NA 141  K 4.7  CL 106  CO2 25  GLUCOSE 122*  BUN 32*  CREATININE 1.73*  CALCIUM 10.2   GFR: Estimated Creatinine Clearance: 28 mL/min (A) (by C-G formula based on SCr of 1.73 mg/dL (H)). Liver Function Tests: Recent Labs  Lab 09/24/20 1932  AST 19  ALT 11  ALKPHOS 76  BILITOT 0.4  PROT 7.0  ALBUMIN 3.8   Recent Labs  Lab 09/24/20 1932  LIPASE 42   No results for input(s): AMMONIA in the last 168 hours. Coagulation Profile: No results for input(s): INR, PROTIME in the last 168 hours. Cardiac Enzymes: No results for input(s): CKTOTAL, CKMB, CKMBINDEX, TROPONINI in the last 168 hours. BNP (last 3 results) No results for input(s): PROBNP in the last 8760 hours. HbA1C: No results for input(s): HGBA1C in the last 72 hours. CBG: No results for input(s): GLUCAP in the last 168 hours. Lipid Profile: No results for input(s): CHOL, HDL, LDLCALC, TRIG, CHOLHDL, LDLDIRECT in the last 72 hours. Thyroid Function Tests: No results for input(s): TSH, T4TOTAL, FREET4, T3FREE, THYROIDAB in the last 72 hours. Anemia Panel: No results for input(s): VITAMINB12, FOLATE, FERRITIN, TIBC, IRON, RETICCTPCT in the last 72 hours. Urine analysis:    Component Value Date/Time   COLORURINE STRAW (A) 09/24/2020 2307   APPEARANCEUR CLEAR 09/24/2020 2307   LABSPEC 1.021 09/24/2020 2307   PHURINE 8.0 09/24/2020  2307   GLUCOSEU NEGATIVE 09/24/2020 2307   HGBUR SMALL (A) 09/24/2020 2307   BILIRUBINUR NEGATIVE 09/24/2020 2307   KETONESUR 5 (A) 09/24/2020 2307   PROTEINUR 100 (A) 09/24/2020 2307   UROBILINOGEN 0.2 11/06/2008 0645   NITRITE NEGATIVE 09/24/2020 2307   LEUKOCYTESUR NEGATIVE 09/24/2020 2307    Radiological Exams on Admission: CT Abdomen Pelvis W Contrast  Result Date: 09/24/2020 CLINICAL DATA:  Periumbilical abdominal pain. EXAM: CT ABDOMEN AND PELVIS WITH CONTRAST TECHNIQUE: Multidetector CT imaging of the abdomen and pelvis was performed using the standard protocol following bolus administration of intravenous contrast. CONTRAST:  65mL OMNIPAQUE IOHEXOL 300 MG/ML  SOLN COMPARISON:  None. FINDINGS: Lower chest: The lung bases are clear. The heart is enlarged. Hepatobiliary: The liver is normal. The gallbladder is distended.There appears to be mild intrahepatic and extrahepatic biliary ductal dilatation with the common bile duct measuring up to approximately 1.1 cm in diameter. Pancreas: Normal contours without ductal dilatation. No peripancreatic fluid collection. Spleen: Unremarkable. Adrenals/Urinary Tract: --Adrenal glands: Unremarkable. --Right kidney/ureter: The right kidney is atrophic with multiple simple appearing cysts. --Left kidney/ureter: There are multiple peripelvic cysts and extrarenal pelvis involving the left kidney. There is no evidence for an obstructing stone or hydronephrosis. --Urinary bladder: Unremarkable. Stomach/Bowel: --Stomach/Duodenum: There is moderate distention of the stomach. --Small bowel: Unremarkable. --Colon: Unremarkable. --Appendix: Normal. Vascular/Lymphatic: There are atherosclerotic changes of the abdominal aorta with aneurysmal dilatation of the infrarenal abdominal aorta. There are tandem fusiform aneurysms. The proximal measures approximately 3.8 x 3.5 cm. The distal aneurysm measures approximately 3.8 x 3.8 cm and extends into the left common iliac artery  where there is aneurysmal dilatation measuring approximately 3.9 cm. There is a high-grade stenosis of the proximal right common iliac artery. There are atherosclerotic changes throughout the remaining visualized vascular structures including a high-grade stenosis of the right common femoral artery and proximal right superficial femoral artery. --No retroperitoneal lymphadenopathy. --No mesenteric lymphadenopathy. --No pelvic or inguinal lymphadenopathy. Reproductive: Unremarkable Other: No ascites or free air. The abdominal wall is normal. Musculoskeletal. No acute displaced fractures. IMPRESSION: 1. Moderate distention  of the stomach without evidence for an identifiable cause. 2. Distended gallbladder with mild biliary ductal dilatation. Correlation with laboratory studies is recommended. Consider further evaluation by ultrasound as clinically indicated. 3. Abdominal aortic aneurysms as detailed above. Recommend follow-up ultrasound every 2 years. This recommendation follows ACR consensus guidelines: White Paper of the ACR Incidental Findings Committee II on Vascular Findings. J Am Coll Radiol 2013; 10:789-794. 4. 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 Outpatient surgical consultation is recommended. 5. Additional vascular disease as detailed above. 6. Atrophic right kidney. Aortic Atherosclerosis (ICD10-I70.0). Electronically Signed   By: Constance Holster M.D.   On: 09/24/2020 21:26   DG Chest Port 1 View  Result Date: 09/24/2020 CLINICAL DATA:  Postprandial upper abdominal pain EXAM: PORTABLE CHEST 1 VIEW COMPARISON:  11/06/2008 FINDINGS: Single frontal view of the chest demonstrates postsurgical changes from CABG. Cardiac silhouette is unremarkable. Chronic scarring within the left mid and lower lung zones. No acute airspace disease, effusion, or pneumothorax. No acute bony abnormalities. IMPRESSION: 1. No acute intrathoracic process. Electronically Signed   By: Randa Ngo M.D.   On:  09/24/2020 19:48   US Abdomen Limited RUQ (LIVER/GB)  Result Date: 09/25/2020 CLINICAL DATA:  Abdominal pain. EXAM: ULTRASOUND ABDOMEN LIMITED RIGHT UPPER QUADRANT COMPARISON:  None. FINDINGS: Gallbladder: The gallbladder is poorly visualized secondary to overlying bowel gas. A small amount of echogenic sludge is suspected along the dependent portion of the gallbladder. No gallstones are identified. The gallbladder wall measures approximately 4.0 mm in thickness. The ultrasound technologist was unable to technically assess for the presence or absence of a sonographic Murphy's sign, however, the patient demonstrated tenderness over the gallbladder during the exam. Common bile duct: Diameter: 5.4 mm Liver: No focal lesion identified. Within normal limits in parenchymal echogenicity. Portal vein is patent on color Doppler imaging with normal direction of blood flow towards the liver. Other: Perihepatic free fluid is seen. IMPRESSION: 1. Markedly limited study, as described above. 2. Suspected gallbladder sludge without evidence of cholelithiasis. 3. Perihepatic free fluid. Electronically Signed   By: Virgina Norfolk M.D.   On: 09/25/2020 02:19    EKG: Independently reviewed.  New onset A.Fib on monitor at bedside, new EKG obtained confirming this.  Assessment/Plan Principal Problem:   Abdominal pain Active Problems:   Hypertension   New onset a-fib ([place])    1. Abd pain - 1. ? Cholecystitis 2. Repeat CBC CMP in AM 3. Call gen surg in AM unless LFTs elevate (in which case call GI in AM) 1. Suspect they probably will want either MRCP or HIDA scan as next step. 4. See Dr. Trula Slade consult note, but seems unlikely that this is AAA causing abd pain. 5. Morphine PRN pain 2. New onset A.Fib - 1. Incedentally pt seems to have new onset of A.Fib today 2. Rate controlled at the moment, likely thanks to chronically being on propranolol for HTN and CAD 3. CHADS-VASC = 4 4. Add heparin gtt 5. F/u  cards 3. HTN - 1. Cont home BP meds except ACEi 2. PRN hydralazine added for SBP > 140 4. CAD - 1. Cont statin  DVT prophylaxis: Heparin gtt Code Status: Full Family Communication: Daughter at bedside Disposition Plan: Home Consults called: Dr. Trula Slade Admission status: Place in obs     Tam Savoia, [place] Hospitalists  How to contact the [facility] Attending or Consulting provider Central or covering provider during after hours [place], for this patient?  1. Check the care  team in [facility] and look for a) attending/consulting [place] provider listed and b) the [facility] team listed 2. Log into [email or web address]  Amion Physician Scheduling and messaging for groups and whole hospitals  On call and physician scheduling software for group practices, residents, hospitalists and other medical providers for call, clinic, rotation and shift schedules. OnCall Enterprise is a hospital-wide system for scheduling doctors and paging doctors on call. EasyPlot is for scientific plotting and data analysis.  [email or web address]  and use [place]'s universal password to access. If you do not have the password, please contact the hospital operator.  3. Locate the [facility] provider you are looking for under [facility] and page to a number that you can be directly reached. 4. If you still have difficulty reaching the provider, please page the [facility] (Director on Call) for the Hospitalists listed on amion for assistance.  09/25/2020, 4:13 AM

## 2020-09-25 NOTE — ED Notes (Signed)
Report called to floor RN

## 2020-09-25 NOTE — Progress Notes (Signed)
  Echocardiogram 2D Echocardiogram has been performed.  Juan Benjamin 09/25/2020, 2:44 PM

## 2020-09-25 NOTE — Progress Notes (Signed)
Patient was transferred to 5W19. Patient has no complaints of pain, just slight discomfort in RUQ. VS stable, on RA, on LR @100  and heparin gtt @ 9. Only patient belongings with patient include jewelry (watch), and clothing. Pt niece is at bedside and has taken other belongings with her. Instructed patient and niece have been instructed on safety precautions and how to use the call bell.

## 2020-09-25 NOTE — ED Notes (Signed)
RN called to give report and told "were trying to figure out if we can take him."

## 2020-09-25 NOTE — ED Notes (Signed)
Alcario Drought, DO made aware pt is back in sinus rhythm, EKG performed.

## 2020-09-25 NOTE — ED Provider Notes (Signed)
Patient sent from outside facility for vascular surgery consultation.  Patient presented initially with epigastric/right upper quadrant pain.  CT scan was obtained showing sludge in the gallbladder, but also a 3.9 cm abdominal aortic aneurysm with no evidence for rupture.    Patient has been seen by vascular surgery.  Dr. Trula Slade does not believe the aneurysm to be the cause of his discomfort.  An ultrasound has been performed of the right upper quadrant as well showing sludge, but no cholelithiasis.  Patient to be admitted to the hospitalist service for serial abdominal exams, further work-up.  Dr. Alcario Drought agrees to admit.   Veryl Speak, MD 09/25/20 [phone]

## 2020-09-25 NOTE — Progress Notes (Signed)
    Subjective  -   Rested comfortable overnight AFIB overnight, now on heparin  Physical Exam:  Majority of tenderness in in RUQ Direct palpation of her aorta is not painful   RUQ u/s: 1. Markedly limited study, as described above. 2. Suspected gallbladder sludge without evidence of cholelithiasis. 3. Perihepatic free fluid.    Assessment/Plan:    Abd Pain:  Suspect, given location of his pain in the RUQ, that this a high probability of being a GB issue.  Further work up pending based on RUQ  U/S.  He continues to be with out tenderness with palpation of his aorta.  Once his pain resolves, or if it doesn't resolve with no clear etiology, I will plan on endovascular repair of his aneurysm  Wells Jeneal Vogl 09/25/2020 7:30 AM --  Vitals:   09/25/20 0615 09/25/20 0651  BP: (!) 111/55 (!) 102/55  Pulse: 73 63  Resp: (!) 25 (!) 27  Temp:    SpO2: 94% 94%    Intake/Output Summary (Last 24 hours) at 09/25/2020 0730 Last data filed at 09/24/2020 2354 Gross per 24 hour  Intake 251.69 ml  Output 600 ml  Net -348.31 ml     Laboratory CBC    Component Value Date/Time   WBC 23.4 (H) 09/25/2020 0500   HGB 14.7 09/25/2020 0500   HCT 46.5 09/25/2020 0500   PLT 184 09/25/2020 0500    BMET    Component Value Date/Time   NA 140 09/25/2020 0500   K 4.5 09/25/2020 0500   CL 110 09/25/2020 0500   CO2 20 (L) 09/25/2020 0500   GLUCOSE 167 (H) 09/25/2020 0500   BUN 26 (H) 09/25/2020 0500   CREATININE 1.48 (H) 09/25/2020 0500   CALCIUM 10.3 09/25/2020 0500   GFRNONAA 46 (L) 09/25/2020 0500   GFRAA (L) 11/08/2008 0420    52        The eGFR has been calculated using the MDRD equation. This calculation has not been validated in all clinical situations. eGFR's persistently <60 mL/min signify possible Chronic Kidney Disease.    COAG No results found for: INR, PROTIME No results found for: PTT  Antibiotics Anti-infectives (From admission, onward)   None       V.  Leia Alf, M.D., [facility] [place] Office: [phone] Pager:  [phone]

## 2020-09-25 NOTE — ED Notes (Signed)
Called new assignment and was told "they're pulling that admission from [place]."

## 2020-09-25 NOTE — Progress Notes (Signed)
ANTICOAGULATION CONSULT NOTE - Initial Consult  Pharmacy Consult for heparin Indication: atrial fibrillation  Allergies  Allergen Reactions  . Wasp Venom Anaphylaxis  . Bee Venom   . Crestor [Rosuvastatin]     myalgia  . Gemfibrozil Other (See Comments)    unknown  . Indomethacin Other (See Comments)    unknown  . Lipitor [Atorvastatin]     myalgia  . Lisinopril Cough  . Rofecoxib Other (See Comments)    unknown  . Sildenafil Other (See Comments)    unknown  . Simvastatin Other (See Comments)    unknown    Patient Measurements: Height: 5\' 7"  (170.2 cm) Weight: 65.8 kg (145 lb) IBW/kg (Calculated) : 66.1  Vital Signs: Temp: 97.7 F (36.5 C) (05/01 2324) Temp Source: Oral (05/01 2324) BP: 129/87 (05/02 0330) Pulse Rate: 77 (05/02 0330)  Labs: Recent Labs    09/24/20 1932 09/24/20 2125  HGB 15.0  --   HCT 45.4  --   PLT 197  --   CREATININE 1.73*  --   TROPONINIHS 9 12    Estimated Creatinine Clearance: 28 mL/min (A) (by C-G formula based on SCr of 1.73 mg/dL (H)).   Medical History: Past Medical History:  Diagnosis Date  . Benign tumor    gland hyperplasia  . Carotid artery occlusion    Chronic occlusion of left, R CEA  . Constipation   . Coronary artery disease   . GERD (gastroesophageal reflux disease)   . Heart murmur   . Hyperlipidemia   . Hypertension   . Hypothyroidism   . Pneumonia   . Prostate cancer [facility])     Assessment: 87yo male admitted for w/u of abdominal pain, AAA dissection ruled out, now in new Afib, to begin heparin.  Goal of Therapy:  Heparin level 0.3-0.7 units/ml Monitor platelets by anticoagulation protocol: Yes   Plan:  Will give heparin 2000 units IV bolus x1 followed by gtt at 900 units/hr and monitor heparin levels and CBC.  Wynona Neat, PharmD, BCPS  09/25/2020,3:58 AM

## 2020-09-25 NOTE — Progress Notes (Signed)
[place] for heparin Indication: atrial fibrillation  Labs: Recent Labs    09/24/20 1932 09/24/20 2125 09/25/20 0500 09/25/20 1142 09/25/20 2144  HGB 15.0  --  14.7  --   --   HCT 45.4  --  46.5  --   --   PLT 197  --  184  --   --   HEPARINUNFRC  --   --   --  0.41 0.46  CREATININE 1.73*  --  1.48*  --   --   TROPONINIHS 9 12  --   --   --     Assessment: 87yo male admitted for w/u of abdominal pain, AAA dissection ruled out, now in new Afib, to begin heparin.  Heparin level therapeutic.  Goal of Therapy:  Heparin level 0.3-0.7 units/ml Monitor platelets by anticoagulation protocol: Yes   Plan:  Continue heparin at 900 units/hr Daily HL and CBC while on heparin  Thanks for allowing pharmacy to be a part of this patient's care.  Excell Seltzer, PharmD Clinical Pharmacist

## 2020-09-25 NOTE — Progress Notes (Signed)
ANTICOAGULATION CONSULT NOTE - Initial Consult  Pharmacy Consult for heparin Indication: atrial fibrillation  Allergies  Allergen Reactions  . Wasp Venom Anaphylaxis  . Bee Venom   . Crestor [Rosuvastatin]     myalgia  . Gemfibrozil Other (See Comments)    unknown  . Indomethacin Other (See Comments)    unknown  . Lipitor [Atorvastatin]     myalgia  . Lisinopril Cough  . Rofecoxib Other (See Comments)    unknown  . Sildenafil Other (See Comments)    unknown  . Simvastatin Other (See Comments)    unknown    Patient Measurements: Height: 5\' 7"  (170.2 cm) Weight: 65.8 kg (145 lb) IBW/kg (Calculated) : 66.1  Vital Signs: Temp: 97.4 F (36.3 C) (05/02 1247) Temp Source: Oral (05/02 1247) BP: 115/54 (05/02 1247) Pulse Rate: 67 (05/02 1247)  Labs: Recent Labs    09/24/20 1932 09/24/20 2125 09/25/20 0500 09/25/20 1142  HGB 15.0  --  14.7  --   HCT 45.4  --  46.5  --   PLT 197  --  184  --   HEPARINUNFRC  --   --   --  0.41  CREATININE 1.73*  --  1.48*  --   TROPONINIHS 9 12  --   --     Estimated Creatinine Clearance: 32.7 mL/min (A) (by C-G formula based on SCr of 1.48 mg/dL (H)).   Medical History: Past Medical History:  Diagnosis Date  . Benign tumor    gland hyperplasia  . Carotid artery occlusion    Chronic occlusion of left, R CEA  . Constipation   . Coronary artery disease   . GERD (gastroesophageal reflux disease)   . Heart murmur   . Hyperlipidemia   . Hypertension   . Hypothyroidism   . Pneumonia   . Prostate cancer [facility])     Assessment: 87yo male admitted for w/u of abdominal pain, AAA dissection ruled out, now in new Afib, to begin heparin.  Heparin level therapeutic. Will continue same rate and check confirm level.   Goal of Therapy:  Heparin level 0.3-0.7 units/ml Monitor platelets by anticoagulation protocol: Yes   Plan:  Continue heparin at 900 units/hr Check 8 hr confirm level Daily HL and CBC  Onnie Boer, PharmD, BCIDP,  AAHIVP, CPP Infectious Disease Pharmacist 09/25/2020 1:20 PM

## 2020-09-25 NOTE — Progress Notes (Addendum)
Same day note Patient seen and examined at bedside.  Patient was admitted to the hospital for abdominal pain.  Mild cough.  No nausea or vomiting.  At the time of my evaluation, patient complains of upper quadrant pain and  Physical examination reveals right upper quadrant tenderness on palpation with mild guarding.  Laboratory data and imaging was reviewed,  Assessment and Plan.  Right upper quadrant pain.  Ultrasound shows some sludge.  Possibility of symptomatic cholelithiasis.  Spoke with general surgery Dr. Bobbye Morton on call.  She requests Cardiology clearance.  Will consult cardiology.  CT scan of the abdomen showed distended gallbladder with mild biliary ductal dilatation with aortic aneurysm.  Currently sips with meds.  Gentle IV fluid hydration.  History of coronary artery disease, peripheral vascular disease, AAA, vasculopathy. Patient is on benazepril, aspirin and Crestor at home.  Essential hypertension.  On propranolol lisinopril and amlodipine.  Atrial fibrillation, rate controlled.  On heparin drip.  Continue propranolol from home.  Hypothyroidism.  Continue Synthroid.  Spoke with the patient's niece at length about the patient's condition surgical consultation.  Signed,  Delila Pereyra, MD [facility]

## 2020-09-25 NOTE — Consult Note (Addendum)
Cardiology Consultation:   Patient ID: Juan Benjamin; [id number]; [birth year]   Admit date: 09/24/2020 Date of Consult: 09/25/2020  Primary Care Provider: Richmond Campbell., PA-C Primary Cardiologist: Dr. Antoine Poche, MD   Patient Profile:   Juan Benjamin is a 87 y.o. male with a hx of CAD s/p CABG in 2000, carotid artery disease with known chronic left carotid artery occlusion, prior tobacco use, HTN, and HLD who is being seen today for preoperative assessment and new onset atrial fibrillation at the request of general surgery.   History of Present Illness:   Mr. Juan Benjamin is an 87yo M with a hx as stated above who presented to [facility] 09/25/20 with abdominal pain that began the day of ED presentation. Patients niece is at bedside who provides most of the HPI. She reports that he has been doing very well with no indication of chest pain, SOB, unusual fatigue, dizziness or syncope. He was seen by his dermatologist after melanoma removal and stated that he felt 87yo again. He is very functional at baseline and can perform >4 METS without any issues. On the day of ED presentation, he began RUQ pain and was taken in for further evaluation. 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 He was transferred to [facility] for further evaluation. He remained hemodynamically stable and was seen by VVS, Dr. Myra Gianotti, on [facility] arrival. Plan at consultation was to explore other etiologies for the abdominal pain and if no clear source, then likely to proceed with possible endovascular repair. He was admitted to hospitalist service team. Abdominal US completed that showed suspected gallbladder sludge without evidence of cholelithiasis felt to possibly be symptomatic cholelithiasis. General surgery consulted with plans for cardiac clearance. Unfortunately he was also found to have what appears to be new onset, but brief atrial fibrillation with rate  control with a  CHA2DS2VASc score is 4 therefore he was started on IV Heparin for anticoagulation. Niece states that he was not given his propanolol last night. Approximately 30 minutes after administration the patient converted to SB.   Past Medical History:  Diagnosis Date  . Benign tumor    gland hyperplasia  . Carotid artery occlusion    Chronic occlusion of left, R CEA  . Constipation   . Coronary artery disease   . GERD (gastroesophageal reflux disease)   . Heart murmur   . Hyperlipidemia   . Hypertension   . Hypothyroidism   . Pneumonia   . Prostate cancer [facility])     Past Surgical History:  Procedure Laterality Date  . CAROTID ARTERY SURGERY     Right   . CORONARY ARTERY BYPASS GRAFT     CABG (4 vessel, 2000.  No report in [place])  . MELANOMA EXCISION WITH SENTINEL LYMPH NODE BIOPSY Left 03/21/2020   Procedure: WIDE LOCAL EXCISION LEFT UPPER ARM MELANOMA ADVANCEMENT FLAP CLOSURE FOR DEFECT 4X8CM SENTINEL LYMPH NODE MAPPING AND BIOPSY;  Surgeon: Almond Lint, MD;  Location: [facility] OR;  Service: General;  Laterality: Left;  . RENAL ARTERY STENT  07/02/1999   RIGHT SIDE     Prior to Admission medications   Medication Sig Start Date End Date Taking? Authorizing Provider  acetaminophen (TYLENOL) 325 MG tablet Take 325-650 mg by mouth every 6 (six) hours as needed for moderate pain (ear pain).    Yes [provider]  allopurinol (ZYLOPRIM) 100 MG tablet Take 100 mg by mouth at  bedtime.    Yes [provider]  amLODipine (NORVASC) 5 MG tablet Take 5 mg by mouth daily.  12/16/16  Yes [provider]  aspirin 81 MG tablet Take 1 tablet (81 mg total) by mouth daily. Patient taking differently: Take 81 mg by mouth at bedtime. 02/24/20  Yes Minus Breeding, MD  benazepril (LOTENSIN) 10 MG tablet Take 10 mg by mouth at bedtime.   Yes [provider]  Cholecalciferol (VITAMIN D-3) 125 MCG (5000 UT) TABS Take 5,000 Units by mouth daily.   Yes [provider]  EPINEPHrine 0.3 mg/0.3 mL IJ SOAJ injection Inject 0.3 mLs (0.3 mg total) into the muscle as needed for anaphylaxis. 12/16/19  Yes Domenic Moras, PA-C  ibuprofen (ADVIL,MOTRIN) 200 MG tablet Take 200 mg by mouth every 6 (six) hours as needed for mild pain or moderate pain (ear pain).    Yes [provider]  levothyroxine (SYNTHROID) 88 MCG tablet    Yes [provider]  multivitamin-lutein (OCUVITE-LUTEIN) CAPS capsule Take 1 capsule by mouth daily.   Yes [provider]  propranolol (INDERAL) 40 MG tablet Take 40 mg by mouth at bedtime.   Yes [provider]  rosuvastatin (CRESTOR) 20 MG tablet Take 1 tablet (20 mg total) by mouth daily. Patient taking differently: Take 10 mg by mouth at bedtime. 03/17/20 06/15/20 Yes HochreinJeneen Rinks, MD  vitamin B-12 (CYANOCOBALAMIN) 1000 MCG tablet Take 1,000 mcg by mouth daily.   Yes [provider]  zinc oxide (BALMEX) 11.3 % CREA cream Apply 1 application topically daily as needed (pressure spot).   Yes [provider]  oxyCODONE (OXY IR/ROXICODONE) 5 MG immediate release tablet Take 0.5-1 tablets (2.5-5 mg total) by mouth every 6 (six) hours as needed for severe pain. Patient not taking: Reported on 09/24/2020 03/21/20   Stark Klein, MD  ranitidine (ZANTAC) 150 MG tablet Take 1 tablet (150 mg total) by mouth 2 (two) times daily. Patient not taking: Reported on 01/20/2017 11/28/16 12/16/19  Palumbo, April, MD    Inpatient Medications: Scheduled Meds: . amLODipine  5 mg Oral Daily  . aspirin EC  81 mg Oral QHS  . levothyroxine  88 mcg Oral Q0600  . multivitamin  1 tablet Oral Daily  . propranolol  40 mg Oral QHS  . rosuvastatin  10 mg Oral QHS   Continuous Infusions: . heparin 900 Units/hr (09/25/20 0502)  . lactated ringers 100 mL/hr at 09/25/20 0357   PRN Meds: acetaminophen **OR** acetaminophen, hydrALAZINE, morphine injection, ondansetron **OR** ondansetron (ZOFRAN) IV  Allergies:     Allergies  Allergen Reactions  . Wasp Venom Anaphylaxis  . Bee Venom   . Crestor [Rosuvastatin]     myalgia  . Gemfibrozil Other (See Comments)    unknown  . Indomethacin Other (See Comments)    unknown  . Lipitor [Atorvastatin]     myalgia  . Lisinopril Cough  . Rofecoxib Other (See Comments)    unknown  . Sildenafil Other (See Comments)    unknown  . Simvastatin Other (See Comments)    unknown    Social History:   Social History   Socioeconomic History  . Marital status: Single    Spouse name: Not on file  . Number of children: Not on file  . Years of education: Not on file  . Highest education level: Not on file  Occupational History  . Occupation: Retired  Tobacco Use  . Smoking status: Former Smoker    Quit date: 12/12/1980  Years since quitting: 39.8  . Smokeless tobacco: Never Used  Vaping Use  . Vaping Use: Never used  Substance and Sexual Activity  . Alcohol use: No  . Drug use: No  . Sexual activity: Not on file  Other Topics Concern  . Not on file  Social History Narrative   Lives at home with wife.  1 son   Social Determinants of Radio broadcast assistant Strain: Not on file  Food Insecurity: Not on file  Transportation Needs: Not on file  Physical Activity: Not on file  Stress: Not on file  Social Connections: Not on file  Intimate Partner Violence: Not on file    Family History:   Family History  Problem Relation Age of Onset  . Bone cancer Mother    Family Status:  Family Status  Relation Name Status  . Mother  Deceased at age 2       bone cancer  . Mat Aunt  Deceased at age 63       asthma  . Father  Deceased  . Sister  Alive  . Brother  Alive    ROS:  Please see the history of present illness.  All other ROS reviewed and negative.     Physical Exam/Data:   Vitals:   09/25/20 0615 09/25/20 0651 09/25/20 0929 09/25/20 1247  BP: (!) 111/55 (!) 102/55 (!) 142/70 (!) 115/54  Pulse: 73 63 68 67  Resp: (!) 25 (!) 27  19 19   Temp:   97.8 F (36.6 C) (!) 97.4 F (36.3 C)  TempSrc:   Oral Oral  SpO2: 94% 94% 94% 94%  Weight:      Height:        Intake/Output Summary (Last 24 hours) at 09/25/2020 1344 Last data filed at 09/24/2020 2354 Gross per 24 hour  Intake 251.69 ml  Output 600 ml  Net -348.31 ml   Filed Weights   09/24/20 1841  Weight: 65.8 kg   Body mass index is 22.71 kg/m.   General: Elderly, frail, NAD Skin: Warm, dry, intact  Neck: Negative for carotid bruits. No JVD Lungs:Clear to ausculation bilaterally. No wheezes, rales, or rhonchi. Breathing is unlabored. Cardiovascular: RRR with S1 S2. + murmur Abdomen: Soft, non-tender, non-distended. No obvious abdominal masses. Extremities: No edema. Radial pulses 2+ bilaterally Neuro: Alert and oriented. No focal deficits. No facial asymmetry. MAE spontaneously. Psych: Responds to questions appropriately with normal affect.     EKG:  The EKG was personally reviewed and demonstrates:  09/25/20 AF with HR 74bpm>>>now converted to SB  Telemetry:  Telemetry was personally reviewed and demonstrates: 09/25/20 Sinus Bradycardia with HR in the 50's   Relevant CV Studies:  Echocardiogram 02/25/2020:  1. Left ventricular ejection fraction, by estimation, is 50 to 55%. The  left ventricle has low normal function. The left ventricle has no regional  wall motion abnormalities. There is moderate asymmetric left ventricular  hypertrophy of the basal-septal  segment. Left ventricular diastolic parameters are indeterminate.  2. Right ventricular systolic function is moderately reduced. The right  ventricular size is normal. Tricuspid regurgitation signal is inadequate  for assessing PA pressure.  3. The mitral valve is normal in structure. Trivial mitral valve  regurgitation.  4. Visually valve appears severely restricted in mobility and visually  heavily calcified. Calculations place AVA just under 1 cm2 but mean  gradient only 23. . The aortic  valve is calcified. There is severe  calcifcation of the aortic valve. There is  moderate thickening of the aortic valve. Aortic valve regurgitation is  moderate.   Carotid dopplers study  03/16/20:  Right Carotid: Non-hemodynamically significant plaque <50% noted in the  CCA. The         ECA appears >50% stenosed. The carotid endarterectomy site  was         well visualize demonstrating normal patency with no  evidence of         significant diameter reduction.   Left Carotid: Evidence consistent with a total occlusion of the left ICA.  The        CCA appears occluded. The ECA appears occluded.  Reconstitution in        the left distal ECA via branch.   Laboratory Data:  Chemistry Recent Labs  Lab 09/24/20 1932 09/25/20 0500  NA 141 140  K 4.7 4.5  CL 106 110  CO2 25 20*  GLUCOSE 122* 167*  BUN 32* 26*  CREATININE 1.73* 1.48*  CALCIUM 10.2 10.3  GFRNONAA 38* 46*  ANIONGAP 10 10    Total Protein  Date Value Ref Range Status  09/25/2020 6.2 (L) 6.5 - 8.1 g/dL Final   Albumin  Date Value Ref Range Status  09/25/2020 3.4 (L) 3.5 - 5.0 g/dL Final   AST  Date Value Ref Range Status  09/25/2020 28 15 - 41 U/L Final   ALT  Date Value Ref Range Status  09/25/2020 18 0 - 44 U/L Final   Alkaline Phosphatase  Date Value Ref Range Status  09/25/2020 74 38 - 126 U/L Final   Total Bilirubin  Date Value Ref Range Status  09/25/2020 1.0 0.3 - 1.2 mg/dL Final   Hematology Recent Labs  Lab 09/24/20 1932 09/25/20 0500  WBC 11.4* 23.4*  RBC 5.03 5.06  HGB 15.0 14.7  HCT 45.4 46.5  MCV 90.3 91.9  MCH 29.8 29.1  MCHC 33.0 31.6  RDW 14.5 14.2  PLT 197 184   Cardiac EnzymesNo results for input(s): TROPONINI in the last 168 hours. No results for input(s): TROPIPOC in the last 168 hours.  BNPNo results for input(s): BNP, PROBNP in the last 168 hours.  DDimer No results for input(s): DDIMER in the last 168 hours. TSH:  Lab  Results  Component Value Date   TSH 0.642 Test methodology is 3rd generation TSH 11/08/2008   Lipids: Lab Results  Component Value Date   CHOL  11/08/2008    129        ATP III CLASSIFICATION:  <200     mg/dL   Desirable  200-239  mg/dL   Borderline High  >=240    mg/dL   High          HDL 34 (L) 11/08/2008   LDLCALC  11/08/2008    80        Total Cholesterol/HDL:CHD Risk Coronary Heart Disease Risk Table                     Men   Women  1/2 Average Risk   3.4   3.3  Average Risk       5.0   4.4  2 X Average Risk   9.6   7.1  3 X Average Risk  23.4   11.0        Use the calculated Patient Ratio above and the CHD Risk Table to determine the patient's CHD Risk.        ATP III CLASSIFICATION (LDL):  <100  mg/dL   Optimal  100-129  mg/dL   Near or Above                    Optimal  130-159  mg/dL   Borderline  160-189  mg/dL   High  >190     mg/dL   Very High   TRIG 77 11/08/2008   CHOLHDL 3.8 11/08/2008   HgbA1c:No results found for: HGBA1C  Radiology/Studies:  CT Abdomen Pelvis W Contrast  Result Date: 09/24/2020 CLINICAL DATA:  Periumbilical abdominal pain. EXAM: CT ABDOMEN AND PELVIS WITH CONTRAST TECHNIQUE: Multidetector CT imaging of the abdomen and pelvis was performed using the standard protocol following bolus administration of intravenous contrast. CONTRAST:  79mL OMNIPAQUE IOHEXOL 300 MG/ML  SOLN COMPARISON:  None. FINDINGS: Lower chest: The lung bases are clear. The heart is enlarged. Hepatobiliary: The liver is normal. The gallbladder is distended.There appears to be mild intrahepatic and extrahepatic biliary ductal dilatation with the common bile duct measuring up to approximately 1.1 cm in diameter. Pancreas: Normal contours without ductal dilatation. No peripancreatic fluid collection. Spleen: Unremarkable. Adrenals/Urinary Tract: --Adrenal glands: Unremarkable. --Right kidney/ureter: The right kidney is atrophic with multiple simple appearing cysts. --Left  kidney/ureter: There are multiple peripelvic cysts and extrarenal pelvis involving the left kidney. There is no evidence for an obstructing stone or hydronephrosis. --Urinary bladder: Unremarkable. Stomach/Bowel: --Stomach/Duodenum: There is moderate distention of the stomach. --Small bowel: Unremarkable. --Colon: Unremarkable. --Appendix: Normal. Vascular/Lymphatic: There are atherosclerotic changes of the abdominal aorta with aneurysmal dilatation of the infrarenal abdominal aorta. There are tandem fusiform aneurysms. The proximal measures approximately 3.8 x 3.5 cm. The distal aneurysm measures approximately 3.8 x 3.8 cm and extends into the left common iliac artery where there is aneurysmal dilatation measuring approximately 3.9 cm. There is a high-grade stenosis of the proximal right common iliac artery. There are atherosclerotic changes throughout the remaining visualized vascular structures including a high-grade stenosis of the right common femoral artery and proximal right superficial femoral artery. --No retroperitoneal lymphadenopathy. --No mesenteric lymphadenopathy. --No pelvic or inguinal lymphadenopathy. Reproductive: Unremarkable Other: No ascites or free air. The abdominal wall is normal. Musculoskeletal. No acute displaced fractures. IMPRESSION: 1. Moderate distention of the stomach without evidence for an identifiable cause. 2. Distended gallbladder with mild biliary ductal dilatation. Correlation with laboratory studies is recommended. Consider further evaluation by ultrasound as clinically indicated. 3. Abdominal aortic aneurysms as detailed above. Recommend follow-up ultrasound every 2 years. This recommendation follows ACR consensus guidelines: White Paper of the ACR Incidental Findings Committee II on Vascular Findings. J Am Coll Radiol 2013; 10:789-794. 4. 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 Outpatient surgical consultation is recommended. 5. Additional vascular disease as  detailed above. 6. Atrophic right kidney. Aortic Atherosclerosis (ICD10-I70.0). Electronically Signed   By: Constance Holster M.D.   On: 09/24/2020 21:26   DG Chest Port 1 View  Result Date: 09/24/2020 CLINICAL DATA:  Postprandial upper abdominal pain EXAM: PORTABLE CHEST 1 VIEW COMPARISON:  11/06/2008 FINDINGS: Single frontal view of the chest demonstrates postsurgical changes from CABG. Cardiac silhouette is unremarkable. Chronic scarring within the left mid and lower lung zones. No acute airspace disease, effusion, or pneumothorax. No acute bony abnormalities. IMPRESSION: 1. No acute intrathoracic process. Electronically Signed   By: Randa Ngo M.D.   On: 09/24/2020 19:48   US Abdomen Limited RUQ (LIVER/GB)  Result Date: 09/25/2020 CLINICAL DATA:  Abdominal pain. EXAM: ULTRASOUND ABDOMEN LIMITED RIGHT UPPER QUADRANT COMPARISON:  None. FINDINGS: Gallbladder: The  gallbladder is poorly visualized secondary to overlying bowel gas. A small amount of echogenic sludge is suspected along the dependent portion of the gallbladder. No gallstones are identified. The gallbladder wall measures approximately 4.0 mm in thickness. The ultrasound technologist was unable to technically assess for the presence or absence of a sonographic Murphy's sign, however, the patient demonstrated tenderness over the gallbladder during the exam. Common bile duct: Diameter: 5.4 mm Liver: No focal lesion identified. Within normal limits in parenchymal echogenicity. Portal vein is patent on color Doppler imaging with normal direction of blood flow towards the liver. Other: Perihepatic free fluid is seen. IMPRESSION: 1. Markedly limited study, as described above. 2. Suspected gallbladder sludge without evidence of cholelithiasis. 3. Perihepatic free fluid. Electronically Signed   By: Virgina Norfolk M.D.   On: 09/25/2020 02:19   Assessment and Plan:   1. Pre-operative cardiovascular assessment: -Presented to [facility]  09/25/20 with abdominal pain that began the day of ED presentation. Patients niece is at bedside who provides most of the HPI. She reports that he has been doing very well with no indication of chest pain, SOB, unusual fatigue, dizziness or syncope. He was seen by his dermatologist after melanoma removal and stated that he felt 87yo again. He is very functional at baseline and can perform >4 METS without any issues. On the day of ED presentation, he began RUQ pain and was taken in for further evaluation. 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 He was transferred to [facility] for further evaluation. He remained hemodynamically stable and was seen by VVS, Dr. Trula Slade, on [facility] arrival. Plan at consultation was to explore other etiologies for the abdominal pain and if no clear source, then likely to proceed with possible endovascular repair. He was admitted to hospitalist service team. Abdominal US completed that showed suspected gallbladder sludge without evidence of cholelithiasis felt to possibly be symptomatic cholelithiasis. General surgery consulted with plans for cardiac clearance -The patient does not have any unstable cardiac conditions. Upon evaluation today, he can achieve 4 METs or greater without difficulty. He has a known hx of CAD however has not had any recent unstable angina. According to [facility] and AHA guidelines, he requires no further cardiac workup prior to his  noncardiac surgery and should be at acceptable, but intermediate risk due to advanced age and hx of CAD. His Revised Cardiac Risk of peri-procedural MI or cardiac arrest following surgery is moderate. Our service is available as necessary in the perioperative period.  2. CAD s/p CABG in 2000: -Recently seen by primary cardiologist prior to dermatology surgery at which time he underwent an echocardiogram that showed an LVEF at 50-55% with moderate asymmetric left ventricular hypertrophy of  the basal-septal segment with severely calcified aortic valve with mod AR. He was not felt to be volume overloaded on exam and plan was to follow clinically for his valve disease -Repeat echo performed however results pending at this time -Continue ASA, statin   3. New onset atrial fibrillation: -Appears to be brief per telemetry review with conversion to sinus bradycardia early this AM -Primary team started IV Heparin for [facility] given the need for surgery  -Currently on propanolol for rate control>>>would consider changing to metoprolol however has been stable on propanolol for quite some time.  -Will need to discuss long term [facility] plan with MD however may not need due to brief episode. In that case, would plan for  OP monitor to assess for AF burden  -CHA2DS2VASc score of at least 4 (age x2, HTN, CV)  4. HLD: -Update more recent lipid panel  -Continue statin>>if not at LDL goal of <70 will plan to up-titrate to higher dosing if tolerable    5. HTN: -Stable, 115/54>>142/70>>102/55 -Continue amlodipine 5mg  PO QD  6. Carotid artery disease: -Most recent doppler study with RICA at <50% and evidence of total occlusion of the LICA with plans to repeat study in one year -Continue ASA, statin therapies    For questions or updates, please contact [place] Please consult [email or web address] for contact info under Cardiology/STEMI.   Lyndel Safe NP-C [facility] Pager: [phone] 09/25/2020 1:44 PM   Patient seen and examined with Kathyrn Drown NP-C.  Agree as above, with the following exceptions and changes as noted below. He denies chest pain or shortness of breath. Has abdominal pain and General Surgery is planning lap cholecystectomy. Had a brief episode of atrial fibrillation and is now on heparin due to CHADS2VASC score of 4 (age x 2, Vasc disease, HTN).  Gen: NAD, CV: RRR, 2/6 SEM. murmurs, Lungs: clear, Abd: soft, Extrem: Warm, well perfused, no edema, Neuro/Psych: alert and oriented  x 3, normal mood and affect. All available labs, radiology testing, previous records reviewed.   We discussed cardiovascular risk stratification at the bedside with his niece Santiago Glad present.  We reviewed that overall given that he is active and asymptomatic at baseline, but is 69 with history of CABG, he is likely intermediate risk for MACE.  The patient is intermediate risk for intermediate risk procedure.  No further cardiovascular testing is required prior to the procedure.  If this level of risk is acceptable to the patient and surgical team, the patient should be considered optimized from a cardiovascular standpoint.  Risk stratification reviewed with the patient, he is in agreement as is his niece, and he is willing to proceed if indicated.  He has moderate aortic valve stenosis by echocardiogram this admission, this can be followed as an outpatient.  Aneurysmal dilatation of the left common iliac artery noted on CT as well as abdominal aortic aneurysm.  VVS has seen, recommended further evaluation of abdominal pain but considered endovascular repair of aneurysm if no other source of pain was found.  Elouise Munroe, MD 09/25/20 4:29 PM

## 2020-09-25 NOTE — ED Notes (Addendum)
US bedside

## 2020-09-25 NOTE — Consult Note (Signed)
[facility] [place]  Patient name: Juan Benjamin MRN: [id number] DOB: 1930/08/30 Sex: male   REQUESTING PROVIDER:    ER   REASON FOR CONSULT:    Abdominal pain  HISTORY OF PRESENT ILLNESS:   [name] is a 87 y.o. male, who presented to [facility] this afternoon with abdominal pain which began around 2 PM.  He states that the pain is around his umbilicus.  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  He was transferred to [facility] for further evaluation.  He now states that the pain goes to his upper back.  He has remained hemodynamically stable.  The patient has a history of coronary artery disease, status post CABG in 2000.  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  He is a former smoker.  He takes a statin for hypercholesterolemia.  PAST MEDICAL HISTORY    Past Medical History:  Diagnosis Date  . Benign tumor    gland hyperplasia  . Carotid artery occlusion    Chronic occlusion of left, R CEA  . Constipation   . Coronary artery disease   . GERD (gastroesophageal reflux disease)   . Heart murmur   . Hyperlipidemia   . Hypertension   . Hypothyroidism   . Pneumonia   . Prostate cancer ([place])      FAMILY HISTORY   Family History  Problem Relation Age of Onset  . Bone cancer Mother     SOCIAL HISTORY:   Social History   Socioeconomic History  . Marital status: Single    Spouse name: Not on file  . Number of children: Not on file  . Years of education: Not on file  . Highest education level: Not on file  Occupational History  . Occupation: Retired  Tobacco Use  . Smoking status: Former Smoker    Quit date: 12/12/1980    Years since quitting: 39.8  . Smokeless tobacco: Never Used  Vaping Use  . Vaping Use: Never used  Substance and Sexual Activity  . Alcohol use: No  . Drug use: No  . Sexual activity: Not  on file  Other Topics Concern  . Not on file  Social History Narrative   Lives at home with wife.  1 son   Social Determinants of Radio broadcast assistant Strain: Not on file  Food Insecurity: Not on file  Transportation Needs: Not on file  Physical Activity: Not on file  Stress: Not on file  Social Connections: Not on file  Intimate Partner Violence: Not on file    ALLERGIES:    Allergies  Allergen Reactions  . Wasp Venom Anaphylaxis  . Bee Venom   . Crestor [Rosuvastatin]     myalgia  . Gemfibrozil Other (See Comments)    unknown  . Indomethacin Other (See Comments)    unknown  . Lipitor [Atorvastatin]     myalgia  . Lisinopril Cough  . Rofecoxib Other (See Comments)    unknown  . Sildenafil Other (See Comments)    unknown  . Simvastatin Other (See Comments)    unknown    CURRENT MEDICATIONS:    No current facility-administered medications for this encounter.   Current Outpatient Medications  Medication Sig Dispense Refill  . acetaminophen (TYLENOL) 325 MG tablet Take 325-650 mg by mouth every 6 (six) hours as needed for moderate pain (ear pain).     Marland Kitchen  allopurinol (ZYLOPRIM) 100 MG tablet Take 100 mg by mouth at bedtime.     Marland Kitchen amLODipine (NORVASC) 5 MG tablet Take 5 mg by mouth daily.     Marland Kitchen aspirin 81 MG tablet Take 1 tablet (81 mg total) by mouth daily. (Patient taking differently: Take 81 mg by mouth at bedtime.) 90 tablet 3  . benazepril (LOTENSIN) 10 MG tablet Take 10 mg by mouth at bedtime.    . Cholecalciferol (VITAMIN D-3) 125 MCG (5000 UT) TABS Take 5,000 Units by mouth daily.    Marland Kitchen EPINEPHrine 0.3 mg/0.3 mL IJ SOAJ injection Inject 0.3 mLs (0.3 mg total) into the muscle as needed for anaphylaxis. 1 each 0  . ibuprofen (ADVIL,MOTRIN) 200 MG tablet Take 200 mg by mouth every 6 (six) hours as needed for mild pain or moderate pain (ear pain).     Marland Kitchen levothyroxine (SYNTHROID) 88 MCG tablet     . multivitamin-lutein (OCUVITE-LUTEIN) CAPS capsule Take 1  capsule by mouth daily.    . propranolol (INDERAL) 40 MG tablet Take 40 mg by mouth at bedtime.    . rosuvastatin (CRESTOR) 20 MG tablet Take 1 tablet (20 mg total) by mouth daily. (Patient taking differently: Take 10 mg by mouth at bedtime.) 90 tablet 3  . vitamin B-12 (CYANOCOBALAMIN) 1000 MCG tablet Take 1,000 mcg by mouth daily.    Marland Kitchen zinc oxide (BALMEX) 11.3 % CREA cream Apply 1 application topically daily as needed (pressure spot).    Marland Kitchen oxyCODONE (OXY IR/ROXICODONE) 5 MG immediate release tablet Take 0.5-1 tablets (2.5-5 mg total) by mouth every 6 (six) hours as needed for severe pain. (Patient not taking: Reported on 09/24/2020) 10 tablet 0    REVIEW OF SYSTEMS:   [X]  denotes positive finding, [ ]  denotes negative finding Cardiac  Comments:  Chest pain or chest pressure:    Shortness of breath upon exertion:    Short of breath when lying flat:    Irregular heart rhythm:        Vascular    Pain in calf, thigh, or hip brought on by ambulation:    Pain in feet at night that wakes you up from your sleep:     Blood clot in your veins:    Leg swelling:         Pulmonary    Oxygen at home:    Productive cough:     Wheezing:         Neurologic    Sudden weakness in arms or legs:     Sudden numbness in arms or legs:     Sudden onset of difficulty speaking or slurred speech:    Temporary loss of vision in one eye:     Problems with dizziness:         Gastrointestinal    Blood in stool:      Vomited blood:         Genitourinary    Burning when urinating:     Blood in urine:        Psychiatric    Major depression:         Hematologic    Bleeding problems:    Problems with blood clotting too easily:        Skin    Rashes or ulcers:        Constitutional    Fever or chills:     PHYSICAL EXAM:   Vitals:   09/24/20 2345 09/25/20 0000 09/25/20 0015 09/25/20 0030  BP: Marland Kitchen)  164/44 (!) 147/84 (!) 146/87 132/67  Pulse: 89 (!) 56 76 60  Resp: 16 19 20 20   Temp:       TempSrc:      SpO2: 95% 94% 94% 92%  Weight:      Height:        GENERAL: The patient is a well-nourished male, in no acute distress. The vital signs are documented above. CARDIAC: There is a regular rate and rhythm.  VASCULAR: Palpable pedal pulses PULMONARY: Nonlabored respirations ABDOMEN: Abdomen is soft.  Most of his tenderness to palpation is in the right upper quadrant.  I am able to press on his aorta and palpate it.  This does not elicit any pain. MUSCULOSKELETAL: There are no major deformities or cyanosis. NEUROLOGIC: No focal weakness or paresthesias are detected. SKIN: There are no ulcers or rashes noted. PSYCHIATRIC: The patient has a normal affect.  STUDIES:   I have reviewed his CT scan with the following findings:  1. Moderate distention of the stomach without evidence for an identifiable cause. 2. Distended gallbladder with mild biliary ductal dilatation. Correlation with laboratory studies is recommended. Consider further evaluation by ultrasound as clinically indicated. 3. Abdominal aortic aneurysms as detailed above. Recommend follow-up ultrasound every 2 years. This recommendation follows ACR consensus guidelines: White Paper of the ACR Incidental Findings Committee II on Vascular Findings. J Am Coll Radiol 2013; 10:789-794. 4. 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 Outpatient surgical consultation is recommended. 5. Additional vascular disease as detailed above. 6. Atrophic right kidney.  ASSESSMENT and PLAN   Abdominal pain: Unclear etiology of his pain at this time.  On exam, most of his tenderness is in the right upper quadrant.  He does have a distended gallbladder with mild biliary ductal dilatation.  This could certainly be the etiology of his pain.  I am able to palpate his aorta and this does not cause him discomfort, therefore I doubt that his aneurysm is the source of his pain.  His left common iliac aneurysm does not meet size  criteria for repair.  At this time I would recommend exploring other potential etiologies for his abdominal pain.  If no obvious cause is found, I would plan on endovascular repair of his aneurysm to exclude this as a possible source.  I have recommended admission to the hospital and ongoing evaluation for a source of his abdominal pain.  He will need serial exams.  I would recommend keeping his blood pressure less than 140.  I discussed this with the patient and his family at the bedside and they are understanding and in agreement.   Leia Alf, MD, FACS [facility] [phone] Pager [phone]

## 2020-09-25 NOTE — Anesthesia Preprocedure Evaluation (Addendum)
Anesthesia Evaluation  Patient identified by MRN, date of birth, ID band Patient awake    Reviewed: Allergy & Precautions, NPO status , Patient's Chart, lab work & pertinent test results  Airway Mallampati: II  TM Distance: >3 FB Neck ROM: Full    Dental  (+) Edentulous Upper, Edentulous Lower   Pulmonary neg pulmonary ROS, former smoker,    Pulmonary exam normal        Cardiovascular hypertension, Pt. on medications and Pt. on home beta blockers + CAD and + Peripheral Vascular Disease   Rhythm:Irregular Rate:Normal     Neuro/Psych negative neurological ROS  negative psych ROS   GI/Hepatic Neg liver ROS, GERD  Medicated,Acute cholecystitis    Endo/Other  Hypothyroidism   Renal/GU negative Renal ROS  negative genitourinary   Musculoskeletal negative musculoskeletal ROS (+)   Abdominal (+)  Abdomen: soft. Bowel sounds: normal.  Peds  Hematology negative hematology ROS (+)   Anesthesia Other Findings   Reproductive/Obstetrics                           Anesthesia Physical Anesthesia Plan  ASA: III  Anesthesia Plan: General   Post-op Pain Management:    Induction: Intravenous  PONV Risk Score and Plan: 2 and Ondansetron, Dexamethasone and Treatment may vary due to age or medical condition  Airway Management Planned: Mask and Oral ETT  Additional Equipment: None  Intra-op Plan:   Post-operative Plan: Extubation in OR  Informed Consent: I have reviewed the patients History and Physical, chart, labs and discussed the procedure including the risks, benefits and alternatives for the proposed anesthesia with the patient or authorized representative who has indicated his/her understanding and acceptance.     Dental advisory given  Plan Discussed with: CRNA  Anesthesia Plan Comments: (Lab Results      Component                Value               Date                      WBC                       23.4 (H)            09/25/2020                HGB                      14.7                09/25/2020                HCT                      46.5                09/25/2020                MCV                      91.9                09/25/2020                PLT  184                 09/25/2020           Lab Results      Component                Value               Date                      NA                       140                 09/25/2020                K                        4.5                 09/25/2020                CO2                      20 (L)              09/25/2020                GLUCOSE                  167 (H)             09/25/2020                BUN                      26 (H)              09/25/2020                CREATININE               1.48 (H)            09/25/2020                CALCIUM                  10.3                09/25/2020                GFRNONAA                 46 (L)              09/25/2020                GFRAA                                        11/08/2008            52        The eGFR has been calculated using the MDRD equation. This calculation has not been validated in all clinical situations. eGFR's persistently <60 mL/min signify possible Chronic Kidney Disease. (L) ECHO 09/25/20: 1. Left ventricular ejection fraction, by estimation, is 55%. The left  ventricle has normal function. The left ventricle has no regional wall  motion abnormalities. Left ventricular diastolic parameters are consistent  with Grade I diastolic dysfunction  (impaired relaxation).  2. Right ventricular systolic function is mildly reduced. The right  ventricular size is normal. Tricuspid regurgitation signal is inadequate  for assessing PA pressure.  3. Left atrial size was mildly dilated.  4. Right atrial size was mildly dilated.  5. The mitral valve is normal in structure. Trivial mitral valve  regurgitation. No evidence of  mitral stenosis.  6. The aortic valve is tricuspid. Aortic valve regurgitation is trivial.  Moderate aortic valve stenosis. Aortic valve mean gradient measures 29.0  mmHg, AVA 1.07 cm^2.  7. Aortic dilatation noted. There is mild dilatation of the aortic root,  measuring 38 mm. )       Anesthesia Quick Evaluation

## 2020-09-25 NOTE — Consult Note (Signed)
Consult Note  Juan Benjamin [facility] 1931-02-10  [id number].    Requesting MD: Dr. Flora Lipps Chief Complaint/Reason for Consult: RUQ pain  HPI:  This is an 87yo male with a history of CAD, HTN, HLD, hypothyroidism, who is still quite active in his church and around his home who ate a PB and banana sandwich yesterday and then developed epigastric abdominal pain.  He denies any nausea or vomiting.  Nothing made his pain better.  He had never previously had pain like this.  He denies any fevers.  Due to the pain, he presented to the ED where he was evaluated and noted to have a distended gb with mild ductal dilatation on his CT scan.  Follow up US obtained which was limited due to overlying bowel gas.  His initial WBC was 11K and spike to 23.4K today.  His LFTs are normal.  We have been asked to see him for further evaluation and recommendations.   ROS: ROS: Please see HPI, otherwise all other systems have been reviewed and are negative except HOH.  Family History  Problem Relation Age of Onset  . Bone cancer Mother     Past Medical History:  Diagnosis Date  . Benign tumor    gland hyperplasia  . Carotid artery occlusion    Chronic occlusion of left, R CEA  . Constipation   . Coronary artery disease   . GERD (gastroesophageal reflux disease)   . Heart murmur   . Hyperlipidemia   . Hypertension   . Hypothyroidism   . Pneumonia   . Prostate cancer [facility])     Past Surgical History:  Procedure Laterality Date  . CAROTID ARTERY SURGERY     Right   . CORONARY ARTERY BYPASS GRAFT     CABG (4 vessel, 2000.  No report in [place])  . MELANOMA EXCISION WITH SENTINEL LYMPH NODE BIOPSY Left 03/21/2020   Procedure: WIDE LOCAL EXCISION LEFT UPPER ARM MELANOMA ADVANCEMENT FLAP CLOSURE FOR DEFECT 4X8CM SENTINEL LYMPH NODE MAPPING AND BIOPSY;  Surgeon: Stark Klein, MD;  Location: [place];  Service: General;  Laterality: Left;  . RENAL ARTERY STENT  07/02/1999   RIGHT SIDE    Social  History:  reports that he quit smoking about 39 years ago. He has never used smokeless tobacco. He reports that he does not drink alcohol and does not use drugs.  Allergies:  Allergies  Allergen Reactions  . Wasp Venom Anaphylaxis  . Bee Venom   . Crestor [Rosuvastatin]     myalgia  . Gemfibrozil Other (See Comments)    unknown  . Indomethacin Other (See Comments)    unknown  . Lipitor [Atorvastatin]     myalgia  . Lisinopril Cough  . Rofecoxib Other (See Comments)    unknown  . Sildenafil Other (See Comments)    unknown  . Simvastatin Other (See Comments)    unknown    Medications Prior to Admission  Medication Sig Dispense Refill  . acetaminophen (TYLENOL) 325 MG tablet Take 325-650 mg by mouth every 6 (six) hours as needed for moderate pain (ear pain).     Marland Kitchen allopurinol (ZYLOPRIM) 100 MG tablet Take 100 mg by mouth at bedtime.     Marland Kitchen amLODipine (NORVASC) 5 MG tablet Take 5 mg by mouth daily.     Marland Kitchen aspirin 81 MG tablet Take 1 tablet (81 mg total) by mouth daily. (Patient taking differently: Take 81 mg by mouth at bedtime.) 90 tablet 3  .  benazepril (LOTENSIN) 10 MG tablet Take 10 mg by mouth at bedtime.    . Cholecalciferol (VITAMIN D-3) 125 MCG (5000 UT) TABS Take 5,000 Units by mouth daily.    Marland Kitchen EPINEPHrine 0.3 mg/0.3 mL IJ SOAJ injection Inject 0.3 mLs (0.3 mg total) into the muscle as needed for anaphylaxis. 1 each 0  . ibuprofen (ADVIL,MOTRIN) 200 MG tablet Take 200 mg by mouth every 6 (six) hours as needed for mild pain or moderate pain (ear pain).     Marland Kitchen levothyroxine (SYNTHROID) 88 MCG tablet     . multivitamin-lutein (OCUVITE-LUTEIN) CAPS capsule Take 1 capsule by mouth daily.    . propranolol (INDERAL) 40 MG tablet Take 40 mg by mouth at bedtime.    . rosuvastatin (CRESTOR) 20 MG tablet Take 1 tablet (20 mg total) by mouth daily. (Patient taking differently: Take 10 mg by mouth at bedtime.) 90 tablet 3  . vitamin B-12 (CYANOCOBALAMIN) 1000 MCG tablet Take 1,000 mcg by  mouth daily.    Marland Kitchen zinc oxide (BALMEX) 11.3 % CREA cream Apply 1 application topically daily as needed (pressure spot).    Marland Kitchen oxyCODONE (OXY IR/ROXICODONE) 5 MG immediate release tablet Take 0.5-1 tablets (2.5-5 mg total) by mouth every 6 (six) hours as needed for severe pain. (Patient not taking: Reported on 09/24/2020) 10 tablet 0    Blood pressure (!) 115/54, pulse 67, temperature (!) 97.4 F (36.3 C), temperature source Oral, resp. rate 19, height 5\' 7"  (1.702 m), weight 65.8 kg, SpO2 94 %. Physical Exam:  General: pleasant, WD, WN male who is laying in bed in NAD HEENT: head is normocephalic, atraumatic.  Sclera are noninjected.  PERRL.  Ears and nose without any masses or lesions.  Mouth is pink and moist Heart: regular, rate, and rhythm.  Normal s1,s2. No obvious murmurs, gallops, or rubs noted.  Palpable radial and pedal pulses bilaterally Lungs: CTAB, no wheezes, rhonchi, or rales noted.  Respiratory effort nonlabored Abd: soft, tender in RUQ with + Murphy's sign, ND, +BS, no masses, hernias, or organomegaly MS: all 4 extremities are symmetrical with no cyanosis, clubbing, or edema. Skin: warm and dry with no masses, lesions, or rashes Neuro: Cranial nerves 2-12 grossly intact, sensation is normal throughout Psych: A&Ox3 with an appropriate affect.   Results for orders placed or performed during the hospital encounter of 09/24/20 (from the past 48 hour(s))  Comprehensive metabolic panel     Status: Abnormal   Collection Time: 09/24/20  7:32 PM  Result Value Ref Range   Sodium 141 135 - 145 mmol/L   Potassium 4.7 3.5 - 5.1 mmol/L   Chloride 106 98 - 111 mmol/L   CO2 25 22 - 32 mmol/L   Glucose, Bld 122 (H) 70 - 99 mg/dL    Comment: Glucose reference range applies only to samples taken after fasting for at least 8 hours.   BUN 32 (H) 8 - 23 mg/dL   Creatinine, Ser 1.73 (H) 0.61 - 1.24 mg/dL   Calcium 10.2 8.9 - 10.3 mg/dL   Total Protein 7.0 6.5 - 8.1 g/dL   Albumin 3.8 3.5 - 5.0  g/dL   AST 19 15 - 41 U/L   ALT 11 0 - 44 U/L   Alkaline Phosphatase 76 38 - 126 U/L   Total Bilirubin 0.4 0.3 - 1.2 mg/dL   GFR, Estimated 38 (L) >60 mL/min    Comment: (NOTE) Calculated using the CKD-EPI Creatinine Equation (2021)    Anion gap 10 5 - 15  Comment: Performed at [facility], [address] [place], [place] [address]  CBC with Differential     Status: Abnormal   Collection Time: 09/24/20  7:32 PM  Result Value Ref Range   WBC 11.4 (H) 4.0 - 10.5 K/uL   RBC 5.03 4.22 - 5.81 MIL/uL   Hemoglobin 15.0 13.0 - 17.0 g/dL   HCT 45.4 39.0 - 52.0 %   MCV 90.3 80.0 - 100.0 fL   MCH 29.8 26.0 - 34.0 pg   MCHC 33.0 30.0 - 36.0 g/dL   RDW 14.5 11.5 - 15.5 %   Platelets 197 150 - 400 K/uL   nRBC 0.0 0.0 - 0.2 %   Neutrophils Relative % 72 %   Neutro Abs 8.2 (H) 1.7 - 7.7 K/uL   Lymphocytes Relative 15 %   Lymphs Abs 1.7 0.7 - 4.0 K/uL   Monocytes Relative 9 %   Monocytes Absolute 1.0 0.1 - 1.0 K/uL   Eosinophils Relative 3 %   Eosinophils Absolute 0.4 0.0 - 0.5 K/uL   Basophils Relative 1 %   Basophils Absolute 0.1 0.0 - 0.1 K/uL   Immature Granulocytes 0 %   Abs Immature Granulocytes 0.04 0.00 - 0.07 K/uL    Comment: Performed at [facility], [address] [place], [place] [address]  Troponin I (High Sensitivity)     Status: None   Collection Time: 09/24/20  7:32 PM  Result Value Ref Range   Troponin I (High Sensitivity) 9 <18 ng/L    Comment: (NOTE) Elevated high sensitivity troponin I (hsTnI) values and significant  changes across serial measurements may suggest ACS but many other  chronic and acute conditions are known to elevate hsTnI results.  Refer to the "Links" section for chest pain algorithms and additional  guidance. Performed at [facility], [address] [place], [place] [address]   Lipase, blood     Status: None   Collection Time: 09/24/20  7:32 PM  Result Value Ref Range   Lipase 42 11 - 51 U/L     Comment: Performed at [facility], [address] [place], [place] [address]  Troponin I (High Sensitivity)     Status: None   Collection Time: 09/24/20  9:25 PM  Result Value Ref Range   Troponin I (High Sensitivity) 12 <18 ng/L    Comment: (NOTE) Elevated high sensitivity troponin I (hsTnI) values and significant  changes across serial measurements may suggest ACS but many other  chronic and acute conditions are known to elevate hsTnI results.  Refer to the "Links" section for chest pain algorithms and additional  guidance. Performed at [facility], [address] [place], [place] [address]   Resp Panel by RT-PCR (Flu A&B, Covid) Nasopharyngeal Swab     Status: None   Collection Time: 09/24/20  9:25 PM   Specimen: Nasopharyngeal Swab; Nasopharyngeal(NP) swabs in vial transport medium  Result Value Ref Range   SARS Coronavirus 2 by RT PCR NEGATIVE NEGATIVE    Comment: (NOTE) SARS-CoV-2 target nucleic acids are NOT DETECTED.  The SARS-CoV-2 RNA is generally detectable in upper respiratory specimens during the acute phase of infection. The lowest concentration of SARS-CoV-2 viral copies this assay can detect is 138 copies/mL. A negative result does not preclude SARS-Cov-2 infection and should not be used as the sole basis for treatment or other patient management decisions. A negative result may occur with  improper  specimen collection/handling, submission of specimen other than nasopharyngeal swab, presence of viral mutation(s) within the areas targeted by this assay, and inadequate number of viral copies(<138 copies/mL). A negative result must be combined with clinical observations, patient history, and epidemiological information. The expected result is Negative.  Fact Sheet for Patients:  EntrepreneurPulse.com.au  Fact Sheet for Healthcare Providers:  IncredibleEmployment.be  This test is no t yet approved or  cleared by the [place] FDA and  has been authorized for detection and/or diagnosis of SARS-CoV-2 by FDA under an Emergency Use Authorization (EUA). This EUA will remain  in effect (meaning this test can be used) for the duration of the COVID-19 declaration under Section 564(b)(1) of the Act, 21 U.S.C.section 360bbb-3(b)(1), unless the authorization is terminated  or revoked sooner.       Influenza A by PCR NEGATIVE NEGATIVE   Influenza B by PCR NEGATIVE NEGATIVE    Comment: (NOTE) The Xpert Xpress SARS-CoV-2/FLU/RSV plus assay is intended as an aid in the diagnosis of influenza from Nasopharyngeal swab specimens and should not be used as a sole basis for treatment. Nasal washings and aspirates are unacceptable for Xpert Xpress SARS-CoV-2/FLU/RSV testing.  Fact Sheet for Patients: EntrepreneurPulse.com.au  Fact Sheet for Healthcare Providers: IncredibleEmployment.be  This test is not yet approved or cleared by the [place] FDA and has been authorized for detection and/or diagnosis of SARS-CoV-2 by FDA under an Emergency Use Authorization (EUA). This EUA will remain in effect (meaning this test can be used) for the duration of the COVID-19 declaration under Section 564(b)(1) of the Act, 21 U.S.C. section 360bbb-3(b)(1), unless the authorization is terminated or revoked.  Performed at [facility], [address] [place], [place] [address]   Urinalysis, Routine w reflex microscopic Urine, Clean Catch     Status: Abnormal   Collection Time: 09/24/20 11:07 PM  Result Value Ref Range   Color, Urine STRAW (A) YELLOW   APPearance CLEAR CLEAR   Specific Gravity, Urine 1.021 1.005 - 1.030   pH 8.0 5.0 - 8.0   Glucose, UA NEGATIVE NEGATIVE mg/dL   Hgb urine dipstick SMALL (A) NEGATIVE   Bilirubin Urine NEGATIVE NEGATIVE   Ketones, ur 5 (A) NEGATIVE mg/dL   Protein, ur 100 (A) NEGATIVE mg/dL   Nitrite NEGATIVE NEGATIVE    Leukocytes,Ua NEGATIVE NEGATIVE   RBC / HPF 0-5 0 - 5 RBC/hpf   WBC, UA 0-5 0 - 5 WBC/hpf   Bacteria, UA NONE SEEN NONE SEEN   Hyaline Casts, UA PRESENT     Comment: Performed at [facility], [address], [place], [place] [address]  CBC     Status: Abnormal   Collection Time: 09/25/20  5:00 AM  Result Value Ref Range   WBC 23.4 (H) 4.0 - 10.5 K/uL   RBC 5.06 4.22 - 5.81 MIL/uL   Hemoglobin 14.7 13.0 - 17.0 g/dL   HCT 46.5 39.0 - 52.0 %   MCV 91.9 80.0 - 100.0 fL   MCH 29.1 26.0 - 34.0 pg   MCHC 31.6 30.0 - 36.0 g/dL   RDW 14.2 11.5 - 15.5 %   Platelets 184 150 - 400 K/uL   nRBC 0.0 0.0 - 0.2 %    Comment: Performed at [facility], [place] [address] [place], [place] [address]  Comprehensive metabolic panel     Status: Abnormal   Collection Time: 09/25/20  5:00 AM  Result Value Ref Range   Sodium 140 135 -  145 mmol/L   Potassium 4.5 3.5 - 5.1 mmol/L   Chloride 110 98 - 111 mmol/L   CO2 20 (L) 22 - 32 mmol/L   Glucose, Bld 167 (H) 70 - 99 mg/dL    Comment: Glucose reference range applies only to samples taken after fasting for at least 8 hours.   BUN 26 (H) 8 - 23 mg/dL   Creatinine, Ser 1.48 (H) 0.61 - 1.24 mg/dL   Calcium 10.3 8.9 - 10.3 mg/dL   Total Protein 6.2 (L) 6.5 - 8.1 g/dL   Albumin 3.4 (L) 3.5 - 5.0 g/dL   AST 28 15 - 41 U/L   ALT 18 0 - 44 U/L   Alkaline Phosphatase 74 38 - 126 U/L   Total Bilirubin 1.0 0.3 - 1.2 mg/dL   GFR, Estimated 46 (L) >60 mL/min    Comment: (NOTE) Calculated using the CKD-EPI Creatinine Equation (2021)    Anion gap 10 5 - 15    Comment: Performed at [facility], [place] [address] [place], [place] [address]  Heparin level (unfractionated)     Status: None   Collection Time: 09/25/20 11:42 AM  Result Value Ref Range   Heparin Unfractionated 0.41 0.30 - 0.70 IU/mL    Comment: (NOTE) The clinical reportable range upper limit is being lowered to >1.10 to align with the FDA approved guidance for the current  laboratory assay.  If heparin results are below expected values, and patient dosage has  been confirmed, suggest follow up testing of antithrombin III levels. Performed at [facility], [place] [address] [place], [place] [address]    CT Abdomen Pelvis W Contrast  Result Date: 09/24/2020 CLINICAL DATA:  Periumbilical abdominal pain. EXAM: CT ABDOMEN AND PELVIS WITH CONTRAST TECHNIQUE: Multidetector CT imaging of the abdomen and pelvis was performed using the standard protocol following bolus administration of intravenous contrast. CONTRAST:  87mL OMNIPAQUE IOHEXOL 300 MG/ML  SOLN COMPARISON:  None. FINDINGS: Lower chest: The lung bases are clear. The heart is enlarged. Hepatobiliary: The liver is normal. The gallbladder is distended.There appears to be mild intrahepatic and extrahepatic biliary ductal dilatation with the common bile duct measuring up to approximately 1.1 cm in diameter. Pancreas: Normal contours without ductal dilatation. No peripancreatic fluid collection. Spleen: Unremarkable. Adrenals/Urinary Tract: --Adrenal glands: Unremarkable. --Right kidney/ureter: The right kidney is atrophic with multiple simple appearing cysts. --Left kidney/ureter: There are multiple peripelvic cysts and extrarenal pelvis involving the left kidney. There is no evidence for an obstructing stone or hydronephrosis. --Urinary bladder: Unremarkable. Stomach/Bowel: --Stomach/Duodenum: There is moderate distention of the stomach. --Small bowel: Unremarkable. --Colon: Unremarkable. --Appendix: Normal. Vascular/Lymphatic: There are atherosclerotic changes of the abdominal aorta with aneurysmal dilatation of the infrarenal abdominal aorta. There are tandem fusiform aneurysms. The proximal measures approximately 3.8 x 3.5 cm. The distal aneurysm measures approximately 3.8 x 3.8 cm and extends into the left common iliac artery where there is aneurysmal dilatation measuring approximately 3.9 cm. There is a high-grade  stenosis of the proximal right common iliac artery. There are atherosclerotic changes throughout the remaining visualized vascular structures including a high-grade stenosis of the right common femoral artery and proximal right superficial femoral artery. --No retroperitoneal lymphadenopathy. --No mesenteric lymphadenopathy. --No pelvic or inguinal lymphadenopathy. Reproductive: Unremarkable Other: No ascites or free air. The abdominal wall is normal. Musculoskeletal. No acute displaced fractures. IMPRESSION: 1. Moderate distention of the stomach without evidence for an identifiable cause. 2. Distended gallbladder with mild biliary ductal dilatation. Correlation with laboratory  studies is recommended. Consider further evaluation by ultrasound as clinically indicated. 3. Abdominal aortic aneurysms as detailed above. Recommend follow-up ultrasound every 2 years. This recommendation follows ACR consensus guidelines: White Paper of the ACR Incidental Findings Committee II on Vascular Findings. J Am Coll Radiol 2013; 10:789-794. 4. 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 Outpatient surgical consultation is recommended. 5. Additional vascular disease as detailed above. 6. Atrophic right kidney. Aortic Atherosclerosis (ICD10-I70.0). Electronically Signed   By: Constance Holster M.D.   On: 09/24/2020 21:26   DG Chest Port 1 View  Result Date: 09/24/2020 CLINICAL DATA:  Postprandial upper abdominal pain EXAM: PORTABLE CHEST 1 VIEW COMPARISON:  11/06/2008 FINDINGS: Single frontal view of the chest demonstrates postsurgical changes from CABG. Cardiac silhouette is unremarkable. Chronic scarring within the left mid and lower lung zones. No acute airspace disease, effusion, or pneumothorax. No acute bony abnormalities. IMPRESSION: 1. No acute intrathoracic process. Electronically Signed   By: Randa Ngo M.D.   On: 09/24/2020 19:48   ECHOCARDIOGRAM COMPLETE  Result Date: 09/25/2020    ECHOCARDIOGRAM  REPORT   Patient Name:   Juan Benjamin Date of Exam: 09/25/2020 Medical Rec #:  [id number]        Height:       67.0 in Accession #:    [id number]       Weight:       145.0 lb Date of Birth:  10-23-1930        BSA:          1.764 m Patient Age:    8 years         BP:           115/54 mmHg Patient Gender: M                HR:           58 bpm. Exam Location:  Inpatient Procedure: 2D Echo Indications:    Atrial Fibrillation I48.91  History:        Patient has prior history of Echocardiogram examinations, most                 recent 02/25/2020. CHF, CAD, Aortic Valve Disease,                 Signs/Symptoms:Murmur; Risk Factors:Hypertension and                 Dyslipidemia.  Sonographer:    Mikki Santee RDCS (AE) Referring Phys: [place]  1. Left ventricular ejection fraction, by estimation, is 55%. The left ventricle has normal function. The left ventricle has no regional wall motion abnormalities. Left ventricular diastolic parameters are consistent with Grade I diastolic dysfunction (impaired relaxation).  2. Right ventricular systolic function is mildly reduced. The right ventricular size is normal. Tricuspid regurgitation signal is inadequate for assessing PA pressure.  3. Left atrial size was mildly dilated.  4. Right atrial size was mildly dilated.  5. The mitral valve is normal in structure. Trivial mitral valve regurgitation. No evidence of mitral stenosis.  6. The aortic valve is tricuspid. Aortic valve regurgitation is trivial. Moderate aortic valve stenosis. Aortic valve mean gradient measures 29.0 mmHg, AVA 1.07 cm^2.  7. Aortic dilatation noted. There is mild dilatation of the aortic root, measuring 38 mm. FINDINGS  Left Ventricle: Left ventricular ejection fraction, by estimation, is 55%. The left ventricle has normal function. The left ventricle has no regional wall motion abnormalities. The left ventricular  internal cavity size was normal in size. There is no left  ventricular hypertrophy. Left ventricular diastolic parameters are consistent with Grade I diastolic dysfunction (impaired relaxation). Right Ventricle: The right ventricular size is normal. No increase in right ventricular wall thickness. Right ventricular systolic function is mildly reduced. Tricuspid regurgitation signal is inadequate for assessing PA pressure. Left Atrium: Left atrial size was mildly dilated. Right Atrium: Right atrial size was mildly dilated. Pericardium: There is no evidence of pericardial effusion. Mitral Valve: The mitral valve is normal in structure. Mild mitral annular calcification. Trivial mitral valve regurgitation. No evidence of mitral valve stenosis. Tricuspid Valve: The tricuspid valve is normal in structure. Tricuspid valve regurgitation is trivial. Aortic Valve: The aortic valve is tricuspid. Aortic valve regurgitation is trivial. Moderate aortic stenosis is present. Aortic valve mean gradient measures 29.0 mmHg. Aortic valve peak gradient measures 48.0 mmHg. Aortic valve area, by VTI measures 1.14  cm. Pulmonic Valve: The pulmonic valve was normal in structure. Pulmonic valve regurgitation is mild. Aorta: Aortic dilatation noted. There is mild dilatation of the aortic root, measuring 38 mm. Venous: The inferior vena cava was not well visualized. IAS/Shunts: No atrial level shunt detected by color flow Doppler.  LEFT VENTRICLE PLAX 2D LVIDd:         4.30 cm  Diastology LVIDs:         3.20 cm  LV e' medial:    7.72 cm/s LV PW:         1.40 cm  LV E/e' medial:  7.6 LV IVS:        1.10 cm  LV e' lateral:   9.90 cm/s LVOT diam:     2.40 cm  LV E/e' lateral: 5.9 LV SV:         92 LV SV Index:   52 LVOT Area:     4.52 cm  RIGHT VENTRICLE RV S prime:     7.35 cm/s TAPSE (M-mode): 1.0 cm LEFT ATRIUM             Index       RIGHT ATRIUM           Index LA diam:        4.40 cm 2.49 cm/m  RA Area:     21.60 cm LA Vol (A2C):   56.1 ml 31.80 ml/m RA Volume:   59.30 ml  33.62 ml/m LA Vol  (A4C):   49.0 ml 27.78 ml/m LA Biplane Vol: 53.2 ml 30.16 ml/m  AORTIC VALVE AV Area (Vmax):    1.14 cm AV Area (Vmean):   1.05 cm AV Area (VTI):     1.14 cm AV Vmax:           346.25 cm/s AV Vmean:          241.500 cm/s AV VTI:            0.805 m AV Peak Grad:      48.0 mmHg AV Mean Grad:      29.0 mmHg LVOT Vmax:         87.50 cm/s LVOT Vmean:        56.300 cm/s LVOT VTI:          0.203 m LVOT/AV VTI ratio: 0.25  AORTA Ao Root diam: 3.50 cm MITRAL VALVE MV Area (PHT): 2.73 cm    SHUNTS MV Decel Time: 278 msec    Systemic VTI:  0.20 m MV E velocity: 58.70 cm/s  Systemic Diam: 2.40 cm MV A velocity:  85.70 cm/s MV E/A ratio:  0.68 Loralie Champagne MD Electronically signed by Loralie Champagne MD Signature Date/Time: 09/25/2020/3:08:51 PM    Final    US Abdomen Limited RUQ (LIVER/GB)  Result Date: 09/25/2020 CLINICAL DATA:  Abdominal pain. EXAM: ULTRASOUND ABDOMEN LIMITED RIGHT UPPER QUADRANT COMPARISON:  None. FINDINGS: Gallbladder: The gallbladder is poorly visualized secondary to overlying bowel gas. A small amount of echogenic sludge is suspected along the dependent portion of the gallbladder. No gallstones are identified. The gallbladder wall measures approximately 4.0 mm in thickness. The ultrasound technologist was unable to technically assess for the presence or absence of a sonographic Murphy's sign, however, the patient demonstrated tenderness over the gallbladder during the exam. Common bile duct: Diameter: 5.4 mm Liver: No focal lesion identified. Within normal limits in parenchymal echogenicity. Portal vein is patent on color Doppler imaging with normal direction of blood flow towards the liver. Other: Perihepatic free fluid is seen. IMPRESSION: 1. Markedly limited study, as described above. 2. Suspected gallbladder sludge without evidence of cholelithiasis. 3. Perihepatic free fluid. Electronically Signed   By: Virgina Norfolk M.D.   On: 09/25/2020 02:19       Assessment/Plan CAD HTN HLD Hypothyroidism  Acute cholecystitis The patient appears to have cholecystitis.  We have start Rocephin.  Cardiology has evaluated the patient and feels he is intermediate risk mostly due to his age, but is acceptable to proceed and the patient would like to as well.  He will be NPO p MN and plan for lap chole tomorrow.   Norm Parcel, [facility] 09/25/2020, 4:35 PM Please see Amion for pager number during day hours 7:00am-4:30pm

## 2020-09-25 NOTE — ED Notes (Signed)
Hospitalist bedside 

## 2020-09-25 NOTE — ED Notes (Addendum)
Clarification sought from [place], DO regarding starting heparin drip regarding pt condition. RN informed to still start pt on heparin drip.

## 2020-09-26 ENCOUNTER — Observation Stay (HOSPITAL_COMMUNITY): Payer: PPO | Admitting: Anesthesiology

## 2020-09-26 ENCOUNTER — Encounter (HOSPITAL_COMMUNITY): Payer: Self-pay | Admitting: Internal Medicine

## 2020-09-26 ENCOUNTER — Encounter (HOSPITAL_COMMUNITY)
Admission: EM | Disposition: A | Discharge status: Home Health | Payer: Self-pay | Source: Home / Self Care | Attending: Internal Medicine

## 2020-09-26 DIAGNOSIS — E785 Hyperlipidemia, unspecified: Secondary | ICD-10-CM

## 2020-09-26 DIAGNOSIS — E039 Hypothyroidism, unspecified: Secondary | ICD-10-CM

## 2020-09-26 DIAGNOSIS — I4891 Unspecified atrial fibrillation: Secondary | ICD-10-CM | POA: Diagnosis not present

## 2020-09-26 DIAGNOSIS — K810 Acute cholecystitis: Secondary | ICD-10-CM | POA: Diagnosis not present

## 2020-09-26 DIAGNOSIS — I10 Essential (primary) hypertension: Secondary | ICD-10-CM | POA: Diagnosis not present

## 2020-09-26 HISTORY — PX: CHOLECYSTECTOMY: SHX55

## 2020-09-26 LAB — MAGNESIUM: Magnesium: 1.7 mg/dL (ref 1.7–2.4)

## 2020-09-26 LAB — URINALYSIS, ROUTINE W REFLEX MICROSCOPIC
Bacteria, UA: NONE SEEN
Bilirubin Urine: NEGATIVE
Glucose, UA: NEGATIVE mg/dL
Ketones, ur: NEGATIVE mg/dL
Leukocytes,Ua: NEGATIVE
Nitrite: NEGATIVE
Protein, ur: 100 mg/dL — AB
Specific Gravity, Urine: 1.024 (ref 1.005–1.030)
pH: 6 (ref 5.0–8.0)

## 2020-09-26 LAB — COMPREHENSIVE METABOLIC PANEL
ALT: 264 U/L — ABNORMAL HIGH (ref 0–44)
AST: 305 U/L — ABNORMAL HIGH (ref 15–41)
Albumin: 3.1 g/dL — ABNORMAL LOW (ref 3.5–5.0)
Alkaline Phosphatase: 131 U/L — ABNORMAL HIGH (ref 38–126)
Anion gap: 10 (ref 5–15)
BUN: 29 mg/dL — ABNORMAL HIGH (ref 8–23)
CO2: 25 mmol/L (ref 22–32)
Calcium: 9.8 mg/dL (ref 8.9–10.3)
Chloride: 104 mmol/L (ref 98–111)
Creatinine, Ser: 1.68 mg/dL — ABNORMAL HIGH (ref 0.61–1.24)
GFR, Estimated: 39 mL/min — ABNORMAL LOW (ref >60)
Glucose, Bld: 99 mg/dL (ref 70–99)
Potassium: 4 mmol/L (ref 3.5–5.1)
Sodium: 139 mmol/L (ref 135–145)
Total Bilirubin: 2.6 mg/dL — ABNORMAL HIGH (ref 0.3–1.2)
Total Protein: 6 g/dL — ABNORMAL LOW (ref 6.5–8.1)

## 2020-09-26 LAB — CBC
HCT: 45.2 % (ref 39.0–52.0)
HCT: 41.4 % (ref 39.0–52.0)
Hemoglobin: 14.4 g/dL (ref 13.0–17.0)
Hemoglobin: 13.3 g/dL (ref 13.0–17.0)
MCH: 29.3 pg (ref 26.0–34.0)
MCH: 29.1 pg (ref 26.0–34.0)
MCHC: 31.9 g/dL (ref 30.0–36.0)
MCHC: 32.1 g/dL (ref 30.0–36.0)
MCV: 91.9 fL (ref 80.0–100.0)
MCV: 90.6 fL (ref 80.0–100.0)
Platelets: 169 10*3/uL (ref 150–400)
Platelets: 154 10*3/uL (ref 150–400)
RBC: 4.92 MIL/uL (ref 4.22–5.81)
RBC: 4.57 MIL/uL (ref 4.22–5.81)
RDW: 14.4 % (ref 11.5–15.5)
RDW: 14.5 % (ref 11.5–15.5)
WBC: 16.3 10*3/uL — ABNORMAL HIGH (ref 4.0–10.5)
WBC: 13.2 10*3/uL — ABNORMAL HIGH (ref 4.0–10.5)
nRBC: 0 % (ref 0.0–0.2)
nRBC: 0 % (ref 0.0–0.2)

## 2020-09-26 LAB — PHOSPHORUS: Phosphorus: 3 mg/dL (ref 2.5–4.6)

## 2020-09-26 LAB — HEPARIN LEVEL (UNFRACTIONATED): Heparin Unfractionated: 0.52 IU/mL (ref 0.30–0.70)

## 2020-09-26 LAB — SURGICAL PCR SCREEN
MRSA, PCR: NEGATIVE
Staphylococcus aureus: NEGATIVE

## 2020-09-26 LAB — TYPE AND SCREEN
ABO/RH(D): A POS
Antibody Screen: NEGATIVE

## 2020-09-26 LAB — ABO/RH: ABO/RH(D): A POS

## 2020-09-26 SURGERY — LAPAROSCOPIC CHOLECYSTECTOMY
Anesthesia: General | Site: Abdomen

## 2020-09-26 MED ORDER — LIDOCAINE 2% (20 MG/ML) 5 ML SYRINGE
INTRAMUSCULAR | Status: DC | PRN
  Administered 2020-09-26: 60 mg via INTRAVENOUS

## 2020-09-26 MED ORDER — FENTANYL CITRATE (PF) 100 MCG/2ML IJ SOLN
25.0000 ug | INTRAMUSCULAR | Status: DC | PRN
  Administered 2020-09-26: 25 ug via INTRAVENOUS

## 2020-09-26 MED ORDER — BUPIVACAINE-EPINEPHRINE 0.25% -1:200000 IJ SOLN
INTRAMUSCULAR | Status: DC | PRN
  Administered 2020-09-26: 30 mL

## 2020-09-26 MED ORDER — ONDANSETRON HCL 4 MG/2ML IJ SOLN
4.0000 mg | Freq: Once | INTRAMUSCULAR | Status: DC | PRN
Start: 2020-09-26 — End: 2020-09-26

## 2020-09-26 MED ORDER — 0.9 % SODIUM CHLORIDE (POUR BTL) OPTIME
TOPICAL | Status: DC | PRN
  Administered 2020-09-26: 1000 mL

## 2020-09-26 MED ORDER — DEXAMETHASONE SODIUM PHOSPHATE 10 MG/ML IJ SOLN
INTRAMUSCULAR | Status: DC | PRN
  Administered 2020-09-26: 5 mg via INTRAVENOUS

## 2020-09-26 MED ORDER — DEXAMETHASONE SODIUM PHOSPHATE 10 MG/ML IJ SOLN
INTRAMUSCULAR | Status: AC
  Filled 2020-09-26: qty 1

## 2020-09-26 MED ORDER — FENTANYL CITRATE (PF) 100 MCG/2ML IJ SOLN
INTRAMUSCULAR | Status: AC
  Filled 2020-09-26: qty 2

## 2020-09-26 MED ORDER — SUGAMMADEX SODIUM 200 MG/2ML IV SOLN
INTRAVENOUS | Status: DC | PRN
  Administered 2020-09-26 (×2): 100 mg via INTRAVENOUS

## 2020-09-26 MED ORDER — BUPIVACAINE-EPINEPHRINE (PF) 0.25% -1:200000 IJ SOLN
INTRAMUSCULAR | Status: AC
  Filled 2020-09-26: qty 30

## 2020-09-26 MED ORDER — ONDANSETRON HCL 4 MG/2ML IJ SOLN
INTRAMUSCULAR | Status: AC
  Filled 2020-09-26: qty 2

## 2020-09-26 MED ORDER — FENTANYL CITRATE (PF) 250 MCG/5ML IJ SOLN
INTRAMUSCULAR | Status: AC
  Filled 2020-09-26: qty 5

## 2020-09-26 MED ORDER — LIDOCAINE 2% (20 MG/ML) 5 ML SYRINGE
INTRAMUSCULAR | Status: AC
  Filled 2020-09-26: qty 5

## 2020-09-26 MED ORDER — OXYCODONE HCL 5 MG PO TABS: 5.0000 mg | ORAL_TABLET | Freq: Once | ORAL | Status: DC | PRN

## 2020-09-26 MED ORDER — EPHEDRINE SULFATE-NACL 50-0.9 MG/10ML-% IV SOSY
PREFILLED_SYRINGE | INTRAVENOUS | Status: DC | PRN
  Administered 2020-09-26 (×2): 10 mg via INTRAVENOUS

## 2020-09-26 MED ORDER — CHLORHEXIDINE GLUCONATE 0.12 % MT SOLN
OROMUCOSAL | Status: AC
  Administered 2020-09-26: 15 mL
  Filled 2020-09-26: qty 15

## 2020-09-26 MED ORDER — ONDANSETRON HCL 4 MG/2ML IJ SOLN
INTRAMUSCULAR | Status: DC | PRN
  Administered 2020-09-26: 4 mg via INTRAVENOUS

## 2020-09-26 MED ORDER — SODIUM CHLORIDE 0.9 % IR SOLN
Status: DC | PRN
  Administered 2020-09-26: 1000 mL

## 2020-09-26 MED ORDER — ROCURONIUM BROMIDE 10 MG/ML (PF) SYRINGE
PREFILLED_SYRINGE | INTRAVENOUS | Status: DC | PRN
  Administered 2020-09-26: 20 mg via INTRAVENOUS
  Administered 2020-09-26: 40 mg via INTRAVENOUS

## 2020-09-26 MED ORDER — EPHEDRINE 5 MG/ML INJ
INTRAVENOUS | Status: AC
  Filled 2020-09-26: qty 10

## 2020-09-26 MED ORDER — OXYCODONE HCL 5 MG/5ML PO SOLN
5.0000 mg | Freq: Once | ORAL | Status: DC | PRN
Start: 2020-09-26 — End: 2020-09-26

## 2020-09-26 MED ORDER — ROCURONIUM BROMIDE 10 MG/ML (PF) SYRINGE
PREFILLED_SYRINGE | INTRAVENOUS | Status: AC
  Filled 2020-09-26: qty 10

## 2020-09-26 MED ORDER — PROPOFOL 10 MG/ML IV BOLUS
INTRAVENOUS | Status: DC | PRN
  Administered 2020-09-26: 100 mg via INTRAVENOUS

## 2020-09-26 MED ORDER — FENTANYL CITRATE (PF) 250 MCG/5ML IJ SOLN
INTRAMUSCULAR | Status: DC | PRN
  Administered 2020-09-26: 50 ug via INTRAVENOUS
  Administered 2020-09-26: 100 ug via INTRAVENOUS
  Administered 2020-09-26 (×2): 50 ug via INTRAVENOUS

## 2020-09-26 SURGICAL SUPPLY — 44 items
APPLIER CLIP 5 13 M/L LIGAMAX5 (MISCELLANEOUS) ×2
BIOPATCH BLUE 3/4IN DISK W/1.5 (GAUZE/BANDAGES/DRESSINGS) ×2 IMPLANT
BLADE CLIPPER SURG (BLADE) IMPLANT
CANISTER SUCT 3000ML PPV (MISCELLANEOUS) ×2 IMPLANT
CHLORAPREP W/TINT 26 (MISCELLANEOUS) ×2 IMPLANT
CLIP APPLIE 5 13 M/L LIGAMAX5 (MISCELLANEOUS) ×1 IMPLANT
COVER SURGICAL LIGHT HANDLE (MISCELLANEOUS) ×2 IMPLANT
DERMABOND ADHESIVE PROPEN (GAUZE/BANDAGES/DRESSINGS) ×1
DERMABOND ADVANCED (GAUZE/BANDAGES/DRESSINGS) ×1
DERMABOND ADVANCED .7 DNX12 (GAUZE/BANDAGES/DRESSINGS) ×1 IMPLANT
DERMABOND ADVANCED .7 DNX6 (GAUZE/BANDAGES/DRESSINGS) ×1 IMPLANT
DISSECTOR BLUNT TIP ENDO 5MM (MISCELLANEOUS) IMPLANT
DRAIN CHANNEL 19F RND (DRAIN) ×2 IMPLANT
DRSG TEGADERM 4X4.5 CHG (GAUZE/BANDAGES/DRESSINGS) ×2 IMPLANT
ELECT CAUTERY BLADE 6.4 (BLADE) ×2 IMPLANT
ELECT REM PT RETURN 9FT ADLT (ELECTROSURGICAL) ×2
ELECTRODE REM PT RTRN 9FT ADLT (ELECTROSURGICAL) ×1 IMPLANT
GLOVE BIO SURGEON STRL SZ 6.5 (GLOVE) ×2 IMPLANT
GLOVE BIOGEL PI IND STRL 6 (GLOVE) ×1 IMPLANT
GLOVE BIOGEL PI INDICATOR 6 (GLOVE) ×1
GOWN STRL REUS W/ TWL LRG LVL3 (GOWN DISPOSABLE) ×3 IMPLANT
GOWN STRL REUS W/TWL LRG LVL3 (GOWN DISPOSABLE) ×6
KIT BASIN OR (CUSTOM PROCEDURE TRAY) ×2 IMPLANT
KIT TURNOVER KIT B (KITS) ×2 IMPLANT
NS IRRIG 1000ML POUR BTL (IV SOLUTION) ×2 IMPLANT
PAD ARMBOARD 7.5X6 YLW CONV (MISCELLANEOUS) ×2 IMPLANT
PENCIL BUTTON HOLSTER BLD 10FT (ELECTRODE) ×2 IMPLANT
POUCH SPECIMEN RETRIEVAL 10MM (ENDOMECHANICALS) ×2 IMPLANT
SCISSORS LAP 5X35 DISP (ENDOMECHANICALS) ×2 IMPLANT
SET IRRIG TUBING LAPAROSCOPIC (IRRIGATION / IRRIGATOR) IMPLANT
SET TUBE SMOKE EVAC HIGH FLOW (TUBING) ×2 IMPLANT
SLEEVE ENDOPATH XCEL 5M (ENDOMECHANICALS) ×4 IMPLANT
SUT ETHILON 2 0 FS 18 (SUTURE) ×2 IMPLANT
SUT MNCRL AB 4-0 PS2 18 (SUTURE) ×2 IMPLANT
SUT VIC AB 0 CT1 27 (SUTURE)
SUT VIC AB 0 CT1 27XBRD ANBCTR (SUTURE) IMPLANT
SUT VIC AB 0 UR5 27 (SUTURE) IMPLANT
SUT VICRYL 0 AB UR-6 (SUTURE) IMPLANT
SYSTEM CARTER THOMASON II (TROCAR) IMPLANT
TOWEL GREEN STERILE FF (TOWEL DISPOSABLE) ×2 IMPLANT
TRAY LAPAROSCOPIC MC (CUSTOM PROCEDURE TRAY) ×2 IMPLANT
TROCAR XCEL BLUNT TIP 100MML (ENDOMECHANICALS) ×2 IMPLANT
TROCAR XCEL NON-BLD 5MMX100MML (ENDOMECHANICALS) ×2 IMPLANT
WATER STERILE IRR 1000ML POUR (IV SOLUTION) ×2 IMPLANT

## 2020-09-26 NOTE — Op Note (Signed)
   Operative Note  Date: 09/26/2020  Procedure: laparoscopic subtotal cholecystectomy, adhesiolysis x34min  Pre-op diagnosis: acute cholecystitis Post-op diagnosis: acute gangrenous, calculous cholecystitis  Indication and clinical history: The patient is a 87 y.o. year old male with acute cholecystitis.  Surgeon: Jesusita Oka, MD Assistant: Anice Paganini, [place]  Anesthesiologist: Gloris Manchester, MD Anesthesia: General  Findings:  . Specimen: gallbladder . EBL: 30cc . Drains/Implants: JP x1, gallbladder fossa, exiting R abdomen  Disposition: PACU - hemodynamically stable.  Description of procedure: The patient was positioned supine on the operating room table. Time-out was performed verifying correct patient, procedure, signature of informed consent, and administration of pre-operative antibiotics. General anesthetic induction and intubation were uneventful. The abdomen was prepped and draped in the usual sterile fashion. An infra-umbilical incision was made using an open technique using zero vicryl stay sutures on either side of the fascia and a 53mm Hassan port inserted. After establishing pneumoperitoneum, which the patient tolerated well, the abdominal cavity was inspected and no injury of any intra-abdominal structures was identified. Additional ports were placed under direct visualization and using local anesthetic: two 58mm ports in the right subcostal region and a 22mm port in the epigastric region. The patient was re-positioned to reverse Trendelenburg and right side up. The gallbladder was tense with adherent omentum suggestive of extensive inflammation, which was confirmed after the beginning of dissection. The gallbladder wall was necrotic and unavoidable spillage of bile occurred with simply grasping the wall. Adhesiolysis was performed for approximately 30 minutes to expose the gallbladder. The infundibulum was unable to identified due to dense adhesions and the decision was made to perform  a subtotal cholecystectomy with excision of the visible gallbladder wall. The exposed inner wall of the gallbladder was cauterized and a 51F JP drain placed into the residual gallbladder cavity, exiting from the right upper quadrant from the most lateral port site. One gallstone were visualized and extracted. The gallblladder fossa was irrigated and suctioned after confirming hemostasis. The gallbladder was placed in an endoscopic specimen retrieval bag, removed via the umbilical port site, and sent to pathology as a specimen. The abdomen was desufflated and the fascia of the umbilical port site was closed using the previously placed stay sutures. Additional local anesthetic was administered at the umbilical port site.  The skin of all incisions was closed with 4-0 monocryl. Sterile dressings were applied. All sponge and instrument counts were correct at the conclusion of the procedure. The patient was awakened from anesthesia, extubated uneventfully, and transported to the PACU - hemodynamically stable.. There were no complications.    Jesusita Oka, MD General and [facility]

## 2020-09-26 NOTE — Progress Notes (Signed)
PROGRESS NOTE    Juan Benjamin  JQB:[id number] DOB: 1931-05-17 DOA: 09/24/2020 PCP: Aletha Halim., PA-C    Chief Complaint  Patient presents with  . Abdominal Pain    Brief admission Narrative:  ODIS TURCK is a 87 y.o. male with medical history significant of CAD, carotid artery dz s/p CEA, HTN.  Pt presents to the ED with c/o abd pain.  Pain onset 3 hours PTA.  This onset was 2h after eating a peanut butter and banana sandwich.  Pain unchanged since onset.  No movement.  Pain is in RUQ.  Pain is constant, moderate.  No N/V/D.  No urinary symptoms, no constipation, no CP.  Assessment & Plan: 1-acute cholecystitis -Continue Rocephin -No febrile at this moment and denies nausea or vomiting -WBCs trending down -LFTs are still elevated -Status post laparoscopic cholecystectomy -Follow general surgery postoperative recommendations -Slowly advancing diet. -Continue as needed antiemetics and analgesics.  2-Hypertension -Overall stable and well-controlled -Continue current antihypertensive agents and follow vital signs.  3-New onset a-fib (HCC) -Appears to be paroxysmal in nature -Currently rate controlled -Following cardiology recommendations patient will need DOAC once clear by surgery to provide anticoagulation. -Continue telemetry monitoring  4-hyperlipidemia -Continue Crestor  5-hypothyroidism -Continue Synthroid  DVT prophylaxis: SCD's; Heparin to be started in 24 hr (per surgery rec's) Code Status: Full code Family Communication: Niece and sister at bedside. Disposition:   Status is: Observation  Dispo: The patient is from: Home              Anticipated d/c is to: Home              Patient currently not medically stable for discharge; status postcholecystectomy, demonstrating elevated LFTs and waiting for intestine to work-up.  Slowly advancing diet and will follow general surgery postoperative recommendations.   Difficult to place patient  no       Consultants:   General surgery  Cardiology   Procedures:  Status post laparoscopic cholecystectomy 09/26/2020   Antimicrobials:  Rocephin    Subjective: Afebrile, no chest pain, currently no complaining of nausea, vomiting or significant abdominal discomfort.  Slightly somnolent/somnolent after anesthesia and laparoscopic cholecystectomy status.  Objective: Vitals:   09/26/20 1105 09/26/20 1111 09/26/20 1225 09/26/20 1548  BP: 135/63  137/68 (!) 159/96  Pulse: 66  69   Resp: 17  19   Temp:  97.9 F (36.6 C) 97.6 F (36.4 C)   TempSrc:   Oral   SpO2: 94%  96%   Weight:      Height:        Intake/Output Summary (Last 24 hours) at 09/26/2020 1631 Last data filed at 09/26/2020 1556 Gross per 24 hour  Intake 2362.17 ml  Output 950 ml  Net 1412.17 ml   Filed Weights   09/24/20 1841  Weight: 65.8 kg    Examination:  General exam: Appears calm and comfortable; sleepy and currently no complaining of significant pain.  Denies nausea or vomiting.  Patient is afebrile.  No chest pain Respiratory system: Clear to auscultation.  Normal respiratory effort; no using accessory muscles.  Good saturation appreciated on exam. Cardiovascular system: Rate controlled, no rubs, no gallops, no JVD on examination. Gastrointestinal system: Abdomen is soft, decreased but present bowel sounds appreciated.  No significant tenderness on palpation. Central nervous system: No focal neurological deficits. Extremities: No cyanosis, no clubbing or edema Skin: No petechiae Psychiatry: Mood & affect appropriate.     Data Reviewed: I have personally reviewed following labs  and imaging studies  CBC: Recent Labs  Lab 09/24/20 1932 09/25/20 0500 09/26/20 0300 09/26/20 1440  WBC 11.4* 23.4* 16.3* 13.2*  NEUTROABS 8.2*  --   --   --   HGB 15.0 14.7 14.4 13.3  HCT 45.4 46.5 45.2 41.4  MCV 90.3 91.9 91.9 90.6  PLT 197 184 169 595    Basic Metabolic Panel: Recent Labs  Lab  09/24/20 1932 09/25/20 0500 09/26/20 0300  NA 141 140 139  K 4.7 4.5 4.0  CL 106 110 104  CO2 25 20* 25  GLUCOSE 122* 167* 99  BUN 32* 26* 29*  CREATININE 1.73* 1.48* 1.68*  CALCIUM 10.2 10.3 9.8  MG  --   --  1.7  PHOS  --   --  3.0    GFR: Estimated Creatinine Clearance: 28.8 mL/min (A) (by C-G formula based on SCr of 1.68 mg/dL (H)).  Liver Function Tests: Recent Labs  Lab 09/24/20 1932 09/25/20 0500 09/26/20 0300  AST 19 28 305*  ALT 11 18 264*  ALKPHOS 76 74 131*  BILITOT 0.4 1.0 2.6*  PROT 7.0 6.2* 6.0*  ALBUMIN 3.8 3.4* 3.1*    CBG: No results for input(s): GLUCAP in the last 168 hours.   Recent Results (from the past 240 hour(s))  Resp Panel by RT-PCR (Flu A&B, Covid) Nasopharyngeal Swab     Status: None   Collection Time: 09/24/20  9:25 PM   Specimen: Nasopharyngeal Swab; Nasopharyngeal(NP) swabs in vial transport medium  Result Value Ref Range Status   SARS Coronavirus 2 by RT PCR NEGATIVE NEGATIVE Final    Comment: (NOTE) SARS-CoV-2 target nucleic acids are NOT DETECTED.  The SARS-CoV-2 RNA is generally detectable in upper respiratory specimens during the acute phase of infection. The lowest concentration of SARS-CoV-2 viral copies this assay can detect is 138 copies/mL. A negative result does not preclude SARS-Cov-2 infection and should not be used as the sole basis for treatment or other patient management decisions. A negative result may occur with  improper specimen collection/handling, submission of specimen other than nasopharyngeal swab, presence of viral mutation(s) within the areas targeted by this assay, and inadequate number of viral copies(<138 copies/mL). A negative result must be combined with clinical observations, patient history, and epidemiological information. The expected result is Negative.  Fact Sheet for Patients:  EntrepreneurPulse.com.au  Fact Sheet for Healthcare Providers:   IncredibleEmployment.be  This test is no t yet approved or cleared by the [place] FDA and  has been authorized for detection and/or diagnosis of SARS-CoV-2 by FDA under an Emergency Use Authorization (EUA). This EUA will remain  in effect (meaning this test can be used) for the duration of the COVID-19 declaration under Section 564(b)(1) of the Act, 21 U.S.C.section 360bbb-3(b)(1), unless the authorization is terminated  or revoked sooner.       Influenza A by PCR NEGATIVE NEGATIVE Final   Influenza B by PCR NEGATIVE NEGATIVE Final    Comment: (NOTE) The Xpert Xpress SARS-CoV-2/FLU/RSV plus assay is intended as an aid in the diagnosis of influenza from Nasopharyngeal swab specimens and should not be used as a sole basis for treatment. Nasal washings and aspirates are unacceptable for Xpert Xpress SARS-CoV-2/FLU/RSV testing.  Fact Sheet for Patients: EntrepreneurPulse.com.au  Fact Sheet for Healthcare Providers: IncredibleEmployment.be  This test is not yet approved or cleared by the [place] FDA and has been authorized for detection and/or diagnosis of SARS-CoV-2 by FDA under an Emergency Use Authorization (EUA). This EUA will remain  in effect (meaning this test can be used) for the duration of the COVID-19 declaration under Section 564(b)(1) of the Act, 21 U.S.C. section 360bbb-3(b)(1), unless the authorization is terminated or revoked.  Performed at [facility], [address] [place], [place] [address]   Surgical PCR screen     Status: None   Collection Time: 09/25/20 11:30 PM   Specimen: Nasal Mucosa; Nasal Swab  Result Value Ref Range Status   MRSA, PCR NEGATIVE NEGATIVE Final   Staphylococcus aureus NEGATIVE NEGATIVE Final    Comment: (NOTE) The Xpert SA Assay (FDA approved for NASAL specimens in patients 91 years of age and older), is one component of a comprehensive surveillance  program. It is not intended to diagnose infection nor to guide or monitor treatment. Performed at [facility], [place] [address], [place], [place] [address]      Radiology Studies: CT Abdomen Pelvis W Contrast  Result Date: 09/24/2020 CLINICAL DATA:  Periumbilical abdominal pain. EXAM: CT ABDOMEN AND PELVIS WITH CONTRAST TECHNIQUE: Multidetector CT imaging of the abdomen and pelvis was performed using the standard protocol following bolus administration of intravenous contrast. CONTRAST:  62mL OMNIPAQUE IOHEXOL 300 MG/ML  SOLN COMPARISON:  None. FINDINGS: Lower chest: The lung bases are clear. The heart is enlarged. Hepatobiliary: The liver is normal. The gallbladder is distended.There appears to be mild intrahepatic and extrahepatic biliary ductal dilatation with the common bile duct measuring up to approximately 1.1 cm in diameter. Pancreas: Normal contours without ductal dilatation. No peripancreatic fluid collection. Spleen: Unremarkable. Adrenals/Urinary Tract: --Adrenal glands: Unremarkable. --Right kidney/ureter: The right kidney is atrophic with multiple simple appearing cysts. --Left kidney/ureter: There are multiple peripelvic cysts and extrarenal pelvis involving the left kidney. There is no evidence for an obstructing stone or hydronephrosis. --Urinary bladder: Unremarkable. Stomach/Bowel: --Stomach/Duodenum: There is moderate distention of the stomach. --Small bowel: Unremarkable. --Colon: Unremarkable. --Appendix: Normal. Vascular/Lymphatic: There are atherosclerotic changes of the abdominal aorta with aneurysmal dilatation of the infrarenal abdominal aorta. There are tandem fusiform aneurysms. The proximal measures approximately 3.8 x 3.5 cm. The distal aneurysm measures approximately 3.8 x 3.8 cm and extends into the left common iliac artery where there is aneurysmal dilatation measuring approximately 3.9 cm. There is a high-grade stenosis of the proximal right common iliac artery. There  are atherosclerotic changes throughout the remaining visualized vascular structures including a high-grade stenosis of the right common femoral artery and proximal right superficial femoral artery. --No retroperitoneal lymphadenopathy. --No mesenteric lymphadenopathy. --No pelvic or inguinal lymphadenopathy. Reproductive: Unremarkable Other: No ascites or free air. The abdominal wall is normal. Musculoskeletal. No acute displaced fractures. IMPRESSION: 1. Moderate distention of the stomach without evidence for an identifiable cause. 2. Distended gallbladder with mild biliary ductal dilatation. Correlation with laboratory studies is recommended. Consider further evaluation by ultrasound as clinically indicated. 3. Abdominal aortic aneurysms as detailed above. Recommend follow-up ultrasound every 2 years. This recommendation follows ACR consensus guidelines: White Paper of the ACR Incidental Findings Committee II on Vascular Findings. J Am Coll Radiol 2013; 10:789-794. 4. 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 Outpatient surgical consultation is recommended. 5. Additional vascular disease as detailed above. 6. Atrophic right kidney. Aortic Atherosclerosis (ICD10-I70.0). Electronically Signed   By: Constance Holster M.D.   On: 09/24/2020 21:26   DG Chest Port 1 View  Result Date: 09/24/2020 CLINICAL DATA:  Postprandial upper abdominal pain EXAM: PORTABLE CHEST 1 VIEW COMPARISON:  11/06/2008 FINDINGS: Single frontal view of the chest demonstrates postsurgical  changes from CABG. Cardiac silhouette is unremarkable. Chronic scarring within the left mid and lower lung zones. No acute airspace disease, effusion, or pneumothorax. No acute bony abnormalities. IMPRESSION: 1. No acute intrathoracic process. Electronically Signed   By: Randa Ngo M.D.   On: 09/24/2020 19:48   ECHOCARDIOGRAM COMPLETE  Result Date: 09/25/2020    ECHOCARDIOGRAM REPORT   Patient Name:   Juan Benjamin Date of Exam:  09/25/2020 Medical Rec #:  [id number]        Height:       67.0 in Accession #:    [id number]       Weight:       145.0 lb Date of Birth:  1930-12-05        BSA:          1.764 m Patient Age:    67 years         BP:           115/54 mmHg Patient Gender: M                HR:           58 bpm. Exam Location:  Inpatient Procedure: 2D Echo Indications:    Atrial Fibrillation I48.91  History:        Patient has prior history of Echocardiogram examinations, most                 recent 02/25/2020. CHF, CAD, Aortic Valve Disease,                 Signs/Symptoms:Murmur; Risk Factors:Hypertension and                 Dyslipidemia.  Sonographer:    Mikki Santee RDCS (AE) Referring Phys: [place]  1. Left ventricular ejection fraction, by estimation, is 55%. The left ventricle has normal function. The left ventricle has no regional wall motion abnormalities. Left ventricular diastolic parameters are consistent with Grade I diastolic dysfunction (impaired relaxation).  2. Right ventricular systolic function is mildly reduced. The right ventricular size is normal. Tricuspid regurgitation signal is inadequate for assessing PA pressure.  3. Left atrial size was mildly dilated.  4. Right atrial size was mildly dilated.  5. The mitral valve is normal in structure. Trivial mitral valve regurgitation. No evidence of mitral stenosis.  6. The aortic valve is tricuspid. Aortic valve regurgitation is trivial. Moderate aortic valve stenosis. Aortic valve mean gradient measures 29.0 mmHg, AVA 1.07 cm^2.  7. Aortic dilatation noted. There is mild dilatation of the aortic root, measuring 38 mm. FINDINGS  Left Ventricle: Left ventricular ejection fraction, by estimation, is 55%. The left ventricle has normal function. The left ventricle has no regional wall motion abnormalities. The left ventricular internal cavity size was normal in size. There is no left ventricular hypertrophy. Left ventricular diastolic parameters are  consistent with Grade I diastolic dysfunction (impaired relaxation). Right Ventricle: The right ventricular size is normal. No increase in right ventricular wall thickness. Right ventricular systolic function is mildly reduced. Tricuspid regurgitation signal is inadequate for assessing PA pressure. Left Atrium: Left atrial size was mildly dilated. Right Atrium: Right atrial size was mildly dilated. Pericardium: There is no evidence of pericardial effusion. Mitral Valve: The mitral valve is normal in structure. Mild mitral annular calcification. Trivial mitral valve regurgitation. No evidence of mitral valve stenosis. Tricuspid Valve: The tricuspid valve is normal in structure. Tricuspid valve regurgitation is trivial. Aortic Valve: The aortic valve is  tricuspid. Aortic valve regurgitation is trivial. Moderate aortic stenosis is present. Aortic valve mean gradient measures 29.0 mmHg. Aortic valve peak gradient measures 48.0 mmHg. Aortic valve area, by VTI measures 1.14  cm. Pulmonic Valve: The pulmonic valve was normal in structure. Pulmonic valve regurgitation is mild. Aorta: Aortic dilatation noted. There is mild dilatation of the aortic root, measuring 38 mm. Venous: The inferior vena cava was not well visualized. IAS/Shunts: No atrial level shunt detected by color flow Doppler.  LEFT VENTRICLE PLAX 2D LVIDd:         4.30 cm  Diastology LVIDs:         3.20 cm  LV e' medial:    7.72 cm/s LV PW:         1.40 cm  LV E/e' medial:  7.6 LV IVS:        1.10 cm  LV e' lateral:   9.90 cm/s LVOT diam:     2.40 cm  LV E/e' lateral: 5.9 LV SV:         92 LV SV Index:   52 LVOT Area:     4.52 cm  RIGHT VENTRICLE RV S prime:     7.35 cm/s TAPSE (M-mode): 1.0 cm LEFT ATRIUM             Index       RIGHT ATRIUM           Index LA diam:        4.40 cm 2.49 cm/m  RA Area:     21.60 cm LA Vol (A2C):   56.1 ml 31.80 ml/m RA Volume:   59.30 ml  33.62 ml/m LA Vol (A4C):   49.0 ml 27.78 ml/m LA Biplane Vol: 53.2 ml 30.16 ml/m   AORTIC VALVE AV Area (Vmax):    1.14 cm AV Area (Vmean):   1.05 cm AV Area (VTI):     1.14 cm AV Vmax:           346.25 cm/s AV Vmean:          241.500 cm/s AV VTI:            0.805 m AV Peak Grad:      48.0 mmHg AV Mean Grad:      29.0 mmHg LVOT Vmax:         87.50 cm/s LVOT Vmean:        56.300 cm/s LVOT VTI:          0.203 m LVOT/AV VTI ratio: 0.25  AORTA Ao Root diam: 3.50 cm MITRAL VALVE MV Area (PHT): 2.73 cm    SHUNTS MV Decel Time: 278 msec    Systemic VTI:  0.20 m MV E velocity: 58.70 cm/s  Systemic Diam: 2.40 cm MV A velocity: 85.70 cm/s MV E/A ratio:  0.68 Loralie Champagne MD Electronically signed by Loralie Champagne MD Signature Date/Time: 09/25/2020/3:08:51 PM    Final    US Abdomen Limited RUQ (LIVER/GB)  Result Date: 09/25/2020 CLINICAL DATA:  Abdominal pain. EXAM: ULTRASOUND ABDOMEN LIMITED RIGHT UPPER QUADRANT COMPARISON:  None. FINDINGS: Gallbladder: The gallbladder is poorly visualized secondary to overlying bowel gas. A small amount of echogenic sludge is suspected along the dependent portion of the gallbladder. No gallstones are identified. The gallbladder wall measures approximately 4.0 mm in thickness. The ultrasound technologist was unable to technically assess for the presence or absence of a sonographic Murphy's sign, however, the patient demonstrated tenderness over the gallbladder during the exam. Common bile duct: Diameter: 5.4  mm Liver: No focal lesion identified. Within normal limits in parenchymal echogenicity. Portal vein is patent on color Doppler imaging with normal direction of blood flow towards the liver. Other: Perihepatic free fluid is seen. IMPRESSION: 1. Markedly limited study, as described above. 2. Suspected gallbladder sludge without evidence of cholelithiasis. 3. Perihepatic free fluid. Electronically Signed   By: Virgina Norfolk M.D.   On: 09/25/2020 02:19    Scheduled Meds: . amLODipine  5 mg Oral Daily  . aspirin EC  81 mg Oral QHS  . fentaNYL      .  levothyroxine  88 mcg Oral Q0600  . multivitamin  1 tablet Oral Daily  . mupirocin ointment  1 application Nasal BID  . propranolol  40 mg Oral QHS  . rosuvastatin  10 mg Oral QHS   Continuous Infusions: . cefTRIAXone (ROCEPHIN)  IV 2 g (09/26/20 1609)  . lactated ringers 100 mL/hr at 09/26/20 0846     LOS: 0 days    Time spent: 30 minutes   Barton Dubois, MD [facility]   To contact the attending provider between 7A-7P or the covering provider during after hours 7P-7A, please log into the web site [email or web address] and access using universal [place] password for that web site. If you do not have the password, please call the hospital operator.  09/26/2020, 4:31 PM

## 2020-09-26 NOTE — Anesthesia Postprocedure Evaluation (Signed)
Anesthesia Post Note  Patient: Juan Benjamin  Procedure(s) Performed: LAPAROSCOPIC SUBTOTAL CHOLECYSTECTOMY (N/A Abdomen)     Patient location during evaluation: PACU Anesthesia Type: General Level of consciousness: awake and alert Pain management: pain level controlled Vital Signs Assessment: post-procedure vital signs reviewed and stable Respiratory status: spontaneous breathing, nonlabored ventilation, respiratory function stable and patient connected to nasal cannula oxygen Cardiovascular status: blood pressure returned to baseline and stable Postop Assessment: no apparent nausea or vomiting Anesthetic complications: no   No complications documented.  Last Vitals:  Vitals:   09/26/20 1105 09/26/20 1111  BP: 135/63   Pulse: 66   Resp: 17   Temp:  36.6 C  SpO2: 94%     Last Pain:  Vitals:   09/26/20 1105  TempSrc:   PainSc: Asleep                 Belenda Cruise P [place]

## 2020-09-26 NOTE — Progress Notes (Signed)
General Surgery Follow Up Note  Subjective:    Overnight Issues:   Objective:  Vital signs for last 24 hours: Temp:  [97.4 F (36.3 C)-98.3 F (36.8 C)] 98.3 F (36.8 C) (05/03 0503) Pulse Rate:  [59-68] 65 (05/03 0510) Resp:  [17-23] 17 (05/03 0510) BP: (109-151)/(53-71) 142/53 (05/03 0510) SpO2:  [92 %-94 %] 93 % (05/03 0510)  Hemodynamic parameters for last 24 hours:    Intake/Output from previous day: 05/02 0701 - 05/03 0700 In: 2425.3 [P.O.:480; I.V.:1745.3; IV Piggyback:200] Out: 450 [Urine:450]  Intake/Output this shift: No intake/output data recorded.  Vent settings for last 24 hours:    Physical Exam:  Gen: comfortable, no distress Neuro: non-focal exam HEENT: PERRL Neck: supple CV: RRR Pulm: unlabored breathing Abd: soft, RUQ TTP GU: clear yellow urine Extr: wwp, no edema   Results for orders placed or performed during the hospital encounter of 09/24/20 (from the past 24 hour(s))  Heparin level (unfractionated)     Status: None   Collection Time: 09/25/20 11:42 AM  Result Value Ref Range   Heparin Unfractionated 0.41 0.30 - 0.70 IU/mL  Heparin level (unfractionated)     Status: None   Collection Time: 09/25/20  9:44 PM  Result Value Ref Range   Heparin Unfractionated 0.46 0.30 - 0.70 IU/mL  Surgical PCR screen     Status: None   Collection Time: 09/25/20 11:30 PM   Specimen: Nasal Mucosa; Nasal Swab  Result Value Ref Range   MRSA, PCR NEGATIVE NEGATIVE   Staphylococcus aureus NEGATIVE NEGATIVE  Heparin level (unfractionated)     Status: None   Collection Time: 09/26/20  3:00 AM  Result Value Ref Range   Heparin Unfractionated 0.52 0.30 - 0.70 IU/mL  CBC     Status: Abnormal   Collection Time: 09/26/20  3:00 AM  Result Value Ref Range   WBC 16.3 (H) 4.0 - 10.5 K/uL   RBC 4.92 4.22 - 5.81 MIL/uL   Hemoglobin 14.4 13.0 - 17.0 g/dL   HCT 45.2 39.0 - 52.0 %   MCV 91.9 80.0 - 100.0 fL   MCH 29.3 26.0 - 34.0 pg   MCHC 31.9 30.0 - 36.0 g/dL    RDW 14.4 11.5 - 15.5 %   Platelets 169 150 - 400 K/uL   nRBC 0.0 0.0 - 0.2 %  Comprehensive metabolic panel     Status: Abnormal   Collection Time: 09/26/20  3:00 AM  Result Value Ref Range   Sodium 139 135 - 145 mmol/L   Potassium 4.0 3.5 - 5.1 mmol/L   Chloride 104 98 - 111 mmol/L   CO2 25 22 - 32 mmol/L   Glucose, Bld 99 70 - 99 mg/dL   BUN 29 (H) 8 - 23 mg/dL   Creatinine, Ser 1.68 (H) 0.61 - 1.24 mg/dL   Calcium 9.8 8.9 - 10.3 mg/dL   Total Protein 6.0 (L) 6.5 - 8.1 g/dL   Albumin 3.1 (L) 3.5 - 5.0 g/dL   AST 305 (H) 15 - 41 U/L   ALT 264 (H) 0 - 44 U/L   Alkaline Phosphatase 131 (H) 38 - 126 U/L   Total Bilirubin 2.6 (H) 0.3 - 1.2 mg/dL   GFR, Estimated 39 (L) >60 mL/min   Anion gap 10 5 - 15  Magnesium     Status: None   Collection Time: 09/26/20  3:00 AM  Result Value Ref Range   Magnesium 1.7 1.7 - 2.4 mg/dL  Phosphorus     Status:  None   Collection Time: 09/26/20  3:00 AM  Result Value Ref Range   Phosphorus 3.0 2.5 - 4.6 mg/dL  Urinalysis, Routine w reflex microscopic     Status: Abnormal   Collection Time: 09/26/20  4:55 AM  Result Value Ref Range   Color, Urine AMBER (A) YELLOW   APPearance CLEAR CLEAR   Specific Gravity, Urine 1.024 1.005 - 1.030   pH 6.0 5.0 - 8.0   Glucose, UA NEGATIVE NEGATIVE mg/dL   Hgb urine dipstick MODERATE (A) NEGATIVE   Bilirubin Urine NEGATIVE NEGATIVE   Ketones, ur NEGATIVE NEGATIVE mg/dL   Protein, ur 100 (A) NEGATIVE mg/dL   Nitrite NEGATIVE NEGATIVE   Leukocytes,Ua NEGATIVE NEGATIVE   RBC / HPF 0-5 0 - 5 RBC/hpf   WBC, UA 0-5 0 - 5 WBC/hpf   Bacteria, UA NONE SEEN NONE SEEN   Squamous Epithelial / LPF 0-5 0 - 5   Mucus PRESENT   Type and screen [place]     Status: None (Preliminary result)   Collection Time: 09/26/20  6:15 AM  Result Value Ref Range   ABO/RH(D) PENDING    Antibody Screen PENDING    Sample Expiration      09/29/2020,2359 Performed at [facility], [place] [address]  [place], [place] [address]     Assessment & Plan:   Present on Admission: . Abdominal pain . Hypertension . New onset a-fib ([place])    LOS: 0 days   Additional comments:I reviewed the patient's new clinical lab test results.   and I reviewed the patients new imaging test results.    Acute cholecytitis - plan for lap chole today, Informed consent was obtained after detailed explanation of risks, including bleeding, infection, biloma, need for IOC to delineate anatomy, and need for conversion to open procedure. All questions answered to the patient's satisfaction. Consent obtained from the patient's niece via phone as well and all questions answered to her satisfaction.  FEN - NPO DVT - SCDs Dispo - med surg, home 5/4    Jesusita Oka, MD Trauma & General Surgery Please use [email or web address] to contact on call provider  09/26/2020  *Care during the described time interval was provided by me. I have reviewed this patient's available data, including medical history, events of note, physical examination and test results as part of my evaluation.

## 2020-09-26 NOTE — Transfer of Care (Signed)
Immediate Anesthesia Transfer of Care Note  Patient: Juan Benjamin  Procedure(s) Performed: LAPAROSCOPIC SUBTOTAL CHOLECYSTECTOMY (N/A Abdomen)  Patient Location: PACU  Anesthesia Type:General  Level of Consciousness: drowsy  Airway & Oxygen Therapy: Patient Spontanous Breathing  Post-op Assessment: Report given to RN and Post -op Vital signs reviewed and stable  Post vital signs: Reviewed and stable  Last Vitals:  Vitals Value Taken Time  BP 150/65 09/26/20 1021  Temp    Pulse 65 09/26/20 1022  Resp 19 09/26/20 1022  SpO2 95 % 09/26/20 1022  Vitals shown include unvalidated device data.  Last Pain:  Vitals:   09/26/20 0510  TempSrc:   PainSc: 2       Patients Stated Pain Goal: 2 (91/69/45 0388)  Complications: No complications documented.

## 2020-09-26 NOTE — Progress Notes (Signed)
Dr. Bobbye Morton reached out to our team about delaying heparin at least until tomorrow given that surgery was bloody. Initial plan was to hold off anticoagulation given brief pre-op atrial fib, but per surgeon report patient did have some afib on induction. Dr. Margaretann Loveless advised that when ready to resume anticoagulation, could either start with heparin for 24 hrs to challenge him if any concerns he may bleed, otherwise go straight to [place]. The team will follow again tomorrow but please call cardiology if any concerns in the meantime.

## 2020-09-26 NOTE — Anesthesia Procedure Notes (Signed)
Procedure Name: Intubation Date/Time: 09/26/2020 8:55 AM Performed by: Trinna Post., CRNA Pre-anesthesia Checklist: Patient identified, Emergency Drugs available, Suction available, Patient being monitored and Timeout performed Patient Re-evaluated:Patient Re-evaluated prior to induction Oxygen Delivery Method: Circle system utilized Preoxygenation: Pre-oxygenation with 100% oxygen Induction Type: IV induction Ventilation: Mask ventilation without difficulty and Oral airway inserted - appropriate to patient size Laryngoscope Size: Mac and 4 Grade View: Grade I Tube type: Oral Tube size: 7.5 mm Number of attempts: 1 Airway Equipment and Method: Stylet Placement Confirmation: ETT inserted through vocal cords under direct vision,  positive ETCO2 and breath sounds checked- equal and bilateral Secured at: 22 cm Tube secured with: Tape Dental Injury: Teeth and Oropharynx as per pre-operative assessment

## 2020-09-26 NOTE — Discharge Instructions (Addendum)
May shower beginning at discharge. Do not peel off or scrub skin glue. May allow warm soapy water to run over incision, then rinse and pat dry. Do not soak in any water (tubs, hot tubs, pools, lakes, oceans) for one week.   No lifting greater than 5 pounds for six weeks.   Pain regimen: take over-the-counter tylenol (acetaminophen) 1000mg  every six hours and the prescription ibuprofen (800mg ) every eight hours. You also have a prescription for oxycodone, which should be taken if the tylenol (acetaminophen) and ibuprofen are not enough to control your pain. You may take the oxycodone as frequently as every six hours as needed. You have also been given a prescription for colace (docusate) which is a stool softener. Please take this as prescribed because the oxycodone can cause constipation and the colace (docusate) will minimize or prevent constipation.  Call the office at [phone] for temperature greater than 101.61F, worsening pain, redness or warmth at the incision site.  Please call [phone] to make an appointment for 1 week after surgery for wound check.    Surgical [facility] Care Surgical drains are used to remove extra fluid that normally builds up in a surgical wound after surgery. A surgical drain helps to heal a surgical wound. Different kinds of surgical drains include:  Active drains. These drains use suction to pull drainage away from the surgical wound. Drainage flows through a tube to a container outside of the body. With these drains, you need to keep the bulb or the drainage container flat (compressed) at all times, except while you empty it. Flattening the bulb or container creates suction.  Passive drains. These drains allow fluid to drain naturally, by gravity. Drainage flows through a tube to a bandage (dressing) or a container outside of the body. Passive drains do not need to be emptied. A drain is placed during surgery. Right after surgery, drainage is usually bright  red and a little thicker than water. The drainage may gradually turn yellow or pink and become thinner. It is likely that your health care provider will remove the drain when the drainage stops or when the amount decreases to 1-2 Tbsp (15-30 mL) during a 24-hour period. Supplies needed:  Tape.  Germ-free cleaning solution (sterile saline).  Cotton swabs.  Split gauze drain sponge: 4 x 4 inches (10 x 10 cm).  Gauze square: 4 x 4 inches (10 x 10 cm). How to care for your surgical drain Care for your drain as told by your health care provider. This is important to help prevent infection. If your drain is placed at your back, or any other hard-to-reach area, ask another person to assist you in performing the following tasks: General care  Keep the skin around the drain dry and covered with a dressing at all times.  Check your drain area every day for signs of infection. Check for: ? Redness, swelling, or pain. ? Pus or a bad smell. ? Cloudy drainage. ? Tenderness or pressure at the drain exit site. Changing the dressing Follow instructions from your health care provider about how to change your dressing. Change your dressing at least once a day. Change it more often if needed to keep the dressing dry. Make sure you: 1. Gather your supplies. 2. Wash your hands with soap and water before you change your dressing. If soap and water are not available, use hand sanitizer. 3. Remove the old dressing. Avoid using scissors to do that. 4. Wash your hands with soap and water  again after removing the old dressing. 5. Use sterile saline to clean your skin around the drain. You may need to use a cotton swab to clean the skin. 6. Place the tube through the slit in a drain sponge. Place the drain sponge so that it covers your wound. 7. Place the gauze square or another drain sponge on top of the drain sponge that is on the wound. Make sure the tube is between those layers. 8. Tape the dressing to your  skin. 9. Tape the drainage tube to your skin 1-2 inches (2.5-5 cm) below the place where the tube enters your body. Taping keeps the tube from pulling on any stitches (sutures) that you have. 10. Wash your hands with soap and water. 11. Write down the color of your drainage and how often you change your dressing. How to empty your active drain 1. Make sure that you have a measuring cup that you can empty your drainage into. 2. Wash your hands with soap and water. If soap and water are not available, use hand sanitizer. 3. Loosen any pins or clips that hold the tube in place. 4. If your health care provider tells you to strip the tube to prevent clots and tube blockages: ? Hold the tube at the skin with one hand. Use your other hand to pinch the tubing with your thumb and first finger. ? Gently move your fingers down the tube while squeezing very lightly. This clears any drainage, clots, or tissue from the tube. ? You may need to do this several times each day to keep the tube clear. Do not pull on the tube. 5. Open the bulb cap or the drain plug. Do not touch the inside of the cap or the bottom of the plug. 6. Turn the device upside down and gently squeeze. 7. Empty all of the drainage into the measuring cup. 8. Compress the bulb or the container and replace the cap or the plug. To compress the bulb or the container, squeeze it firmly in the middle while you close the cap or plug the container. 9. Write down the amount of drainage that you have in each 24-hour period. If you have less than 2 Tbsp (30 mL) of drainage during 24 hours, contact your health care provider. 10. Flush the drainage down the toilet. 11. Wash your hands with soap and water.   Contact a health care provider if:  You have redness, swelling, or pain around your drain area.  You have pus or a bad smell coming from your drain area.  You have a fever or chills.  The skin around your drain is warm to the touch.  The  amount of drainage that you have is increasing instead of decreasing.  You have drainage that is cloudy.  There is a sudden stop or a sudden decrease in the amount of drainage that you have.  Your drain tube falls out.  Your active drain does not stay compressed after you empty it. Summary  Surgical drains are used to remove extra fluid that normally builds up in a surgical wound after surgery.  Different kinds of surgical drains include active drains and passive drains. Active drains use suction to pull drainage away from the surgical wound, and passive drains allow fluid to drain naturally.  It is important to care for your drain to prevent infection. If your drain is placed at your back, or any other hard-to-reach area, ask another person to assist you.  Contact your  health care provider if you have redness, swelling, or pain around your drain area. This information is not intended to replace advice given to you by your health care provider. Make sure you discuss any questions you have with your health care provider. Document Revised: 06/17/2018 Document Reviewed: 06/17/2018 Elsevier Patient Education  2021 [place] Record  Empty your surgical drain as told by your health care provider. Use this form to write down the amount of fluid that has collected in the drainage container. Bring this form with you to your follow-up visits. Surgical drain #1 location: ___________________ Date __________ Time __________ Amount __________ Date __________ Time __________ Amount __________ Date __________ Time __________ Amount __________ Date __________ Time __________ Amount __________ Date __________ Time __________ Amount __________ Date __________ Time __________ Amount __________ Date __________ Time __________ Amount __________ Date __________ Time __________ Amount __________ Date __________ Time __________ Amount __________ Date __________ Time __________ Amount  __________ Date __________ Time __________ Amount __________ Date __________ Time __________ Amount __________ Date __________ Time __________ Amount __________ Date __________ Time __________ Amount __________ Date __________ Time __________ Amount __________ Date __________ Time __________ Amount __________ Date __________ Time __________ Amount __________ Date __________ Time __________ Amount __________ Date __________ Time __________ Amount __________ Date __________ Time __________ Amount __________ Date __________ Time __________ Amount __________ Surgical drain #2 location: ___________________ Date __________ Time __________ Amount __________ Date __________ Time __________ Amount __________ Date __________ Time __________ Amount __________ Date __________ Time __________ Amount __________ Date __________ Time __________ Amount __________ Date __________ Time __________ Amount __________ Date __________ Time __________ Amount __________ Date __________ Time __________ Amount __________ Date __________ Time __________ Amount __________ Date __________ Time __________ Amount __________ Date __________ Time __________ Amount __________ Date __________ Time __________ Amount __________ Date __________ Time __________ Amount __________ Date __________ Time __________ Amount __________ Date __________ Time __________ Amount __________ Date __________ Time __________ Amount __________ Date __________ Time __________ Amount __________ Date __________ Time __________ Amount __________ Date __________ Time __________ Amount __________ Date __________ Time __________ Amount __________ Date __________ Time __________ Amount __________ This information is not intended to replace advice given to you by your health care provider. Make sure you discuss any questions you have with your health care provider. Document Revised: 06/28/2019 Document Reviewed: 02/17/2017 Elsevier Patient  Education  2021 [employer].

## 2020-09-27 ENCOUNTER — Encounter (HOSPITAL_COMMUNITY): Payer: Self-pay | Admitting: Surgery

## 2020-09-27 DIAGNOSIS — R109 Unspecified abdominal pain: Secondary | ICD-10-CM | POA: Diagnosis not present

## 2020-09-27 DIAGNOSIS — K279 Peptic ulcer, site unspecified, unspecified as acute or chronic, without hemorrhage or perforation: Secondary | ICD-10-CM | POA: Diagnosis not present

## 2020-09-27 DIAGNOSIS — N1831 Chronic kidney disease, stage 3a: Secondary | ICD-10-CM | POA: Diagnosis present

## 2020-09-27 DIAGNOSIS — Z8546 Personal history of malignant neoplasm of prostate: Secondary | ICD-10-CM | POA: Diagnosis not present

## 2020-09-27 DIAGNOSIS — R1033 Periumbilical pain: Secondary | ICD-10-CM | POA: Diagnosis present

## 2020-09-27 DIAGNOSIS — K2970 Gastritis, unspecified, without bleeding: Secondary | ICD-10-CM | POA: Diagnosis not present

## 2020-09-27 DIAGNOSIS — N179 Acute kidney failure, unspecified: Secondary | ICD-10-CM | POA: Diagnosis not present

## 2020-09-27 DIAGNOSIS — I714 Abdominal aortic aneurysm, without rupture: Secondary | ICD-10-CM | POA: Diagnosis present

## 2020-09-27 DIAGNOSIS — Z9889 Other specified postprocedural states: Secondary | ICD-10-CM | POA: Diagnosis not present

## 2020-09-27 DIAGNOSIS — I723 Aneurysm of iliac artery: Secondary | ICD-10-CM | POA: Diagnosis present

## 2020-09-27 DIAGNOSIS — K839 Disease of biliary tract, unspecified: Secondary | ICD-10-CM | POA: Diagnosis not present

## 2020-09-27 DIAGNOSIS — F05 Delirium due to known physiological condition: Secondary | ICD-10-CM | POA: Diagnosis not present

## 2020-09-27 DIAGNOSIS — B370 Candidal stomatitis: Secondary | ICD-10-CM | POA: Diagnosis not present

## 2020-09-27 DIAGNOSIS — K810 Acute cholecystitis: Secondary | ICD-10-CM | POA: Diagnosis present

## 2020-09-27 DIAGNOSIS — K82A1 Gangrene of gallbladder in cholecystitis: Secondary | ICD-10-CM | POA: Diagnosis present

## 2020-09-27 DIAGNOSIS — I70201 Unspecified atherosclerosis of native arteries of extremities, right leg: Secondary | ICD-10-CM | POA: Diagnosis present

## 2020-09-27 DIAGNOSIS — E7800 Pure hypercholesterolemia, unspecified: Secondary | ICD-10-CM | POA: Diagnosis present

## 2020-09-27 DIAGNOSIS — Z951 Presence of aortocoronary bypass graft: Secondary | ICD-10-CM | POA: Diagnosis not present

## 2020-09-27 DIAGNOSIS — I2510 Atherosclerotic heart disease of native coronary artery without angina pectoris: Secondary | ICD-10-CM | POA: Diagnosis present

## 2020-09-27 DIAGNOSIS — K8000 Calculus of gallbladder with acute cholecystitis without obstruction: Secondary | ICD-10-CM | POA: Diagnosis present

## 2020-09-27 DIAGNOSIS — I10 Essential (primary) hypertension: Secondary | ICD-10-CM | POA: Diagnosis not present

## 2020-09-27 DIAGNOSIS — R1010 Upper abdominal pain, unspecified: Secondary | ICD-10-CM | POA: Diagnosis not present

## 2020-09-27 DIAGNOSIS — K660 Peritoneal adhesions (postprocedural) (postinfection): Secondary | ICD-10-CM | POA: Diagnosis present

## 2020-09-27 DIAGNOSIS — I4891 Unspecified atrial fibrillation: Secondary | ICD-10-CM | POA: Diagnosis not present

## 2020-09-27 DIAGNOSIS — I480 Paroxysmal atrial fibrillation: Secondary | ICD-10-CM | POA: Diagnosis not present

## 2020-09-27 DIAGNOSIS — K838 Other specified diseases of biliary tract: Secondary | ICD-10-CM | POA: Diagnosis not present

## 2020-09-27 DIAGNOSIS — I129 Hypertensive chronic kidney disease with stage 1 through stage 4 chronic kidney disease, or unspecified chronic kidney disease: Secondary | ICD-10-CM | POA: Diagnosis present

## 2020-09-27 DIAGNOSIS — I350 Nonrheumatic aortic (valve) stenosis: Secondary | ICD-10-CM | POA: Diagnosis present

## 2020-09-27 DIAGNOSIS — E785 Hyperlipidemia, unspecified: Secondary | ICD-10-CM | POA: Diagnosis present

## 2020-09-27 DIAGNOSIS — K828 Other specified diseases of gallbladder: Secondary | ICD-10-CM | POA: Diagnosis not present

## 2020-09-27 DIAGNOSIS — R1011 Right upper quadrant pain: Secondary | ICD-10-CM | POA: Diagnosis not present

## 2020-09-27 DIAGNOSIS — I358 Other nonrheumatic aortic valve disorders: Secondary | ICD-10-CM | POA: Diagnosis present

## 2020-09-27 DIAGNOSIS — E039 Hypothyroidism, unspecified: Secondary | ICD-10-CM | POA: Diagnosis present

## 2020-09-27 DIAGNOSIS — I6529 Occlusion and stenosis of unspecified carotid artery: Secondary | ICD-10-CM | POA: Diagnosis present

## 2020-09-27 DIAGNOSIS — Z79899 Other long term (current) drug therapy: Secondary | ICD-10-CM | POA: Diagnosis not present

## 2020-09-27 DIAGNOSIS — K3189 Other diseases of stomach and duodenum: Secondary | ICD-10-CM | POA: Diagnosis not present

## 2020-09-27 DIAGNOSIS — Y838 Other surgical procedures as the cause of abnormal reaction of the patient, or of later complication, without mention of misadventure at the time of the procedure: Secondary | ICD-10-CM | POA: Diagnosis not present

## 2020-09-27 DIAGNOSIS — D72829 Elevated white blood cell count, unspecified: Secondary | ICD-10-CM | POA: Diagnosis not present

## 2020-09-27 DIAGNOSIS — Z20822 Contact with and (suspected) exposure to covid-19: Secondary | ICD-10-CM | POA: Diagnosis present

## 2020-09-27 LAB — COMPREHENSIVE METABOLIC PANEL
ALT: 130 U/L — ABNORMAL HIGH (ref 0–44)
AST: 84 U/L — ABNORMAL HIGH (ref 15–41)
Albumin: 2.3 g/dL — ABNORMAL LOW (ref 3.5–5.0)
Alkaline Phosphatase: 86 U/L (ref 38–126)
Anion gap: 6 (ref 5–15)
BUN: 25 mg/dL — ABNORMAL HIGH (ref 8–23)
CO2: 26 mmol/L (ref 22–32)
Calcium: 9 mg/dL (ref 8.9–10.3)
Chloride: 106 mmol/L (ref 98–111)
Creatinine, Ser: 1.45 mg/dL — ABNORMAL HIGH (ref 0.61–1.24)
GFR, Estimated: 47 mL/min — ABNORMAL LOW (ref >60)
Glucose, Bld: 125 mg/dL — ABNORMAL HIGH (ref 70–99)
Potassium: 4.4 mmol/L (ref 3.5–5.1)
Sodium: 138 mmol/L (ref 135–145)
Total Bilirubin: 0.3 mg/dL (ref 0.3–1.2)
Total Protein: 4.8 g/dL — ABNORMAL LOW (ref 6.5–8.1)

## 2020-09-27 MED ORDER — TRAMADOL HCL 50 MG PO TABS
50.0000 mg | ORAL_TABLET | Freq: Four times a day (QID) | ORAL | Status: DC | PRN
  Administered 2020-09-28 – 2020-09-29 (×3): 100 mg via ORAL
  Administered 2020-09-29 – 2020-09-30 (×2): 50 mg via ORAL
  Administered 2020-10-01: 100 mg via ORAL
  Filled 2020-09-27: qty 2
  Filled 2020-09-27: qty 1
  Filled 2020-09-27 (×4): qty 2

## 2020-09-27 MED ORDER — SENNA 8.6 MG PO TABS
2.0000 | ORAL_TABLET | Freq: Every day | ORAL | Status: DC
  Administered 2020-09-27 – 2020-10-04 (×8): 17.2 mg via ORAL
  Filled 2020-09-27 (×8): qty 2

## 2020-09-27 MED ORDER — ENOXAPARIN SODIUM 80 MG/0.8ML IJ SOSY
65.0000 mg | PREFILLED_SYRINGE | Freq: Once | INTRAMUSCULAR | Status: AC
  Administered 2020-09-27: 65 mg via SUBCUTANEOUS
  Filled 2020-09-27: qty 0.8

## 2020-09-27 MED ORDER — ENOXAPARIN SODIUM 80 MG/0.8ML IJ SOSY
65.0000 mg | PREFILLED_SYRINGE | Freq: Two times a day (BID) | INTRAMUSCULAR | Status: DC
  Administered 2020-09-28: 65 mg via SUBCUTANEOUS
  Filled 2020-09-27: qty 0.8

## 2020-09-27 MED ORDER — LIDOCAINE 5 % EX PTCH
1.0000 | MEDICATED_PATCH | CUTANEOUS | Status: DC
  Administered 2020-09-27 – 2020-10-02 (×6): 1 via TRANSDERMAL
  Filled 2020-09-27 (×7): qty 1

## 2020-09-27 MED ORDER — POLYETHYLENE GLYCOL 3350 17 G PO PACK
17.0000 g | PACK | Freq: Every day | ORAL | Status: DC
  Administered 2020-09-27: 17 g via ORAL
  Filled 2020-09-27 (×3): qty 1

## 2020-09-27 NOTE — Evaluation (Signed)
Occupational Therapy Evaluation Patient Details Name: Juan Benjamin MRN: [id number] DOB: [birth year] Today's Date: 09/27/2020    History of Present Illness Pt is an 87 year old mand admitted with abdominal pain, + acute cholecystitis. Underwent subtotal laproscopic cholecystectomy on 09/26/20 with drain placement. Hospital course complicated by new onset afib. PMH: CAD s/p CABG, CEA, HTN, prostate ca, melanoma, hypothyroidism, HLD.   Clinical Impression   Pt is independent at baseline. 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 Pt's "niece" stays with him 2 nights a week to check on him. Pt presents with mild abdominal pain. He ambulated with the RW initially, but by the end of the session could manage with supervision and no device. Pt is overall functioning at a supervision level for safety. He will have 24 hour care upon initially returning home. No further OT needs.     Follow Up Recommendations  No OT follow up    Equipment Recommendations  None recommended by OT    Recommendations for Other Services       Precautions / Restrictions Precautions Precautions: Other (comment) Precaution Comments: R side JP drain      Mobility Bed Mobility Overal bed mobility: Needs Assistance Bed Mobility: Supine to Sit     Supine to sit: Min assist     General bed mobility comments: pulled up on therapist's hand    Transfers Overall transfer level: Needs assistance Equipment used: Rolling walker (2 wheeled) Transfers: Sit to/from Stand Sit to Stand: Supervision         General transfer comment: cues for hand placement, pt demonstrated ability to walk pushing IV pole by end of session    Balance                                           ADL either performed or assessed with clinical judgement   ADL                                         General ADL Comments: Overall  functioning at a set up to supervision level.     Vision Baseline Vision/History: Wears glasses Wears Glasses: At all times Patient Visual Report: No change from baseline       Perception     Praxis      Pertinent Vitals/Pain Pain Assessment: Faces Faces Pain Scale: Hurts a little bit Pain Location: abdomen Pain Descriptors / Indicators: Sore Pain Intervention(s): Monitored during session;Premedicated before session     Hand Dominance Right   Extremity/Trunk Assessment Upper Extremity Assessment Upper Extremity Assessment: Overall WFL for tasks assessed   Lower Extremity Assessment Lower Extremity Assessment: Defer to PT evaluation       Communication Communication Communication: HOH   Cognition Arousal/Alertness: Awake/alert Behavior During Therapy: WFL for tasks assessed/performed Overall Cognitive Status: Within Functional Limits for tasks assessed                                     General Comments       Exercises     Shoulder Instructions      Home Living Family/patient expects to be  discharged to:: Private residence Living Arrangements: Alone Available Help at Discharge: Family;Available 24 hours/day Type of Home: House Home Access: Stairs to enter [employer] of Steps: 4 Entrance Stairs-Rails: Right;Left;Can reach both Home Layout: One level     Bathroom Shower/Tub: Occupational psychologist: Handicapped height     Home Equipment: None          Prior Functioning/Environment Level of Independence: Independent        Comments: active        OT Problem List:        OT Treatment/Interventions:      OT Goals(Current goals can be found in the care plan section) Acute Rehab OT Goals Patient Stated Goal: return home  OT Frequency:     Barriers to D/C:            Co-evaluation              AM-PAC OT "6 Clicks" Daily Activity     Outcome Measure Help from another person eating meals?:  None Help from another person taking care of personal grooming?: None Help from another person toileting, which includes using toliet, bedpan, or urinal?: None Help from another person bathing (including washing, rinsing, drying)?: None Help from another person to put on and taking off regular upper body clothing?: None Help from another person to put on and taking off regular lower body clothing?: None 6 Click Score: 24   End of Session Equipment Utilized During Treatment: Rolling walker  Activity Tolerance: Patient tolerated treatment well Patient left: in bed;with call bell/phone within reach;with bed alarm set;with family/visitor present  OT Visit Diagnosis: Pain                Time: 1051-1130 OT Time Calculation (min): 39 min Charges:  OT General Charges $OT Visit: 1 Visit OT Evaluation $OT Eval Low Complexity: 1 Low OT Treatments $Self Care/Home Management : 8-22 mins  Juan Benjamin, OTR/L Acute Rehabilitation Services Pager: [phone] Office: [phone]  Juan Benjamin 09/27/2020, 12:01 PM

## 2020-09-27 NOTE — Progress Notes (Addendum)
Progress Note  Patient Name: Juan Benjamin Date of Encounter: 09/27/2020  [facility] Cardiologist: Minus Breeding, MD   Subjective   No chest pain  Inpatient Medications    Scheduled Meds: . amLODipine  5 mg Oral Daily  . aspirin EC  81 mg Oral QHS  . levothyroxine  88 mcg Oral Q0600  . lidocaine  1 patch Transdermal Q24H  . multivitamin  1 tablet Oral Daily  . mupirocin ointment  1 application Nasal BID  . propranolol  40 mg Oral QHS  . rosuvastatin  10 mg Oral QHS   Continuous Infusions: . cefTRIAXone (ROCEPHIN)  IV Stopped (09/26/20 1944)  . lactated ringers 100 mL/hr at 09/26/20 2150   PRN Meds: acetaminophen **OR** acetaminophen, hydrALAZINE, morphine injection, ondansetron **OR** ondansetron (ZOFRAN) IV, traMADol   Vital Signs    Vitals:   09/26/20 2131 09/26/20 2151 09/27/20 0549 09/27/20 0817  BP: 129/67  139/60 (!) 145/66  Pulse: 78 77 (!) 57 62  Resp: 18  17 17   Temp: 97.8 F (36.6 C)  97.9 F (36.6 C) 98.8 F (37.1 C)  TempSrc: Axillary  Axillary Oral  SpO2: 98%  98% 91%  Weight:      Height:        Intake/Output Summary (Last 24 hours) at 09/27/2020 0854 Last data filed at 09/27/2020 0820 Gross per 24 hour  Intake 2311.51 ml  Output 1470 ml  Net 841.51 ml   Last 3 Weights 09/24/2020 03/21/2020 03/16/2020  Weight (lbs) 145 lb 147 lb 0.8 oz 147 lb  Weight (kg) 65.772 kg 66.7 kg 66.679 kg      Telemetry    SR - Personally Reviewed  ECG    No new - Personally Reviewed  Physical Exam   GEN: No acute distress.   Neck: No JVD Cardiac: RRR,  [id number] systolic murmur, no rubs, or gallops.  Respiratory: Clear to auscultation bilaterally. GI: Soft, nontender, non-distended  MS: No edema; No deformity. Neuro:  Nonfocal  Psych: Normal affect   Labs    High Sensitivity Troponin:   Recent Labs  Lab 09/24/20 1932 09/24/20 2125  TROPONINIHS 9 12      Chemistry Recent Labs  Lab 09/25/20 0500 09/26/20 0300 09/27/20 0112  NA 140  139 138  K 4.5 4.0 4.4  CL 110 104 106  CO2 20* 25 26  GLUCOSE 167* 99 125*  BUN 26* 29* 25*  CREATININE 1.48* 1.68* 1.45*  CALCIUM 10.3 9.8 9.0  PROT 6.2* 6.0* 4.8*  ALBUMIN 3.4* 3.1* 2.3*  AST 28 305* 84*  ALT 18 264* 130*  ALKPHOS 74 131* 86  BILITOT 1.0 2.6* 0.3  GFRNONAA 46* 39* 47*  ANIONGAP 10 10 6      Hematology Recent Labs  Lab 09/25/20 0500 09/26/20 0300 09/26/20 1440  WBC 23.4* 16.3* 13.2*  RBC 5.06 4.92 4.57  HGB 14.7 14.4 13.3  HCT 46.5 45.2 41.4  MCV 91.9 91.9 90.6  MCH 29.1 29.3 29.1  MCHC 31.6 31.9 32.1  RDW 14.2 14.4 14.5  PLT 184 169 154    BNPNo results for input(s): BNP, PROBNP in the last 168 hours.   DDimer No results for input(s): DDIMER in the last 168 hours.   Radiology    ECHOCARDIOGRAM COMPLETE  Result Date: 09/25/2020    ECHOCARDIOGRAM REPORT   Patient Name:   Juan Benjamin Date of Exam: 09/25/2020 Medical Rec #:  [id number]        Height:  67.0 in Accession #:    [id number]       Weight:       145.0 lb Date of Birth:  [birth year]        BSA:          1.764 m Patient Age:    87 years         BP:           115/54 mmHg Patient Gender: M                HR:           58 bpm. Exam Location:  Inpatient Procedure: 2D Echo Indications:    Atrial Fibrillation I48.91  History:        Patient has prior history of Echocardiogram examinations, most                 recent 02/25/2020. CHF, CAD, Aortic Valve Disease,                 Signs/Symptoms:Murmur; Risk Factors:Hypertension and                 Dyslipidemia.  Sonographer:    Mikki Santee RDCS (AE) Referring Phys: Perrin  1. Left ventricular ejection fraction, by estimation, is 55%. The left ventricle has normal function. The left ventricle has no regional wall motion abnormalities. Left ventricular diastolic parameters are consistent with Grade I diastolic dysfunction (impaired relaxation).  2. Right ventricular systolic function is mildly reduced. The right ventricular  size is normal. Tricuspid regurgitation signal is inadequate for assessing PA pressure.  3. Left atrial size was mildly dilated.  4. Right atrial size was mildly dilated.  5. The mitral valve is normal in structure. Trivial mitral valve regurgitation. No evidence of mitral stenosis.  6. The aortic valve is tricuspid. Aortic valve regurgitation is trivial. Moderate aortic valve stenosis. Aortic valve mean gradient measures 29.0 mmHg, AVA 1.07 cm^2.  7. Aortic dilatation noted. There is mild dilatation of the aortic root, measuring 38 mm. FINDINGS  Left Ventricle: Left ventricular ejection fraction, by estimation, is 55%. The left ventricle has normal function. The left ventricle has no regional wall motion abnormalities. The left ventricular internal cavity size was normal in size. There is no left ventricular hypertrophy. Left ventricular diastolic parameters are consistent with Grade I diastolic dysfunction (impaired relaxation). Right Ventricle: The right ventricular size is normal. No increase in right ventricular wall thickness. Right ventricular systolic function is mildly reduced. Tricuspid regurgitation signal is inadequate for assessing PA pressure. Left Atrium: Left atrial size was mildly dilated. Right Atrium: Right atrial size was mildly dilated. Pericardium: There is no evidence of pericardial effusion. Mitral Valve: The mitral valve is normal in structure. Mild mitral annular calcification. Trivial mitral valve regurgitation. No evidence of mitral valve stenosis. Tricuspid Valve: The tricuspid valve is normal in structure. Tricuspid valve regurgitation is trivial. Aortic Valve: The aortic valve is tricuspid. Aortic valve regurgitation is trivial. Moderate aortic stenosis is present. Aortic valve mean gradient measures 29.0 mmHg. Aortic valve peak gradient measures 48.0 mmHg. Aortic valve area, by VTI measures 1.14  cm. Pulmonic Valve: The pulmonic valve was normal in structure. Pulmonic valve  regurgitation is mild. Aorta: Aortic dilatation noted. There is mild dilatation of the aortic root, measuring 38 mm. Venous: The inferior vena cava was not well visualized. IAS/Shunts: No atrial level shunt detected by color flow Doppler.  LEFT VENTRICLE PLAX 2D LVIDd:  4.30 cm  Diastology LVIDs:         3.20 cm  LV e' medial:    7.72 cm/s LV PW:         1.40 cm  LV E/e' medial:  7.6 LV IVS:        1.10 cm  LV e' lateral:   9.90 cm/s LVOT diam:     2.40 cm  LV E/e' lateral: 5.9 LV SV:         92 LV SV Index:   52 LVOT Area:     4.52 cm  RIGHT VENTRICLE RV S prime:     7.35 cm/s TAPSE (M-mode): 1.0 cm LEFT ATRIUM             Index       RIGHT ATRIUM           Index LA diam:        4.40 cm 2.49 cm/m  RA Area:     21.60 cm LA Vol (A2C):   56.1 ml 31.80 ml/m RA Volume:   59.30 ml  33.62 ml/m LA Vol (A4C):   49.0 ml 27.78 ml/m LA Biplane Vol: 53.2 ml 30.16 ml/m  AORTIC VALVE AV Area (Vmax):    1.14 cm AV Area (Vmean):   1.05 cm AV Area (VTI):     1.14 cm AV Vmax:           346.25 cm/s AV Vmean:          241.500 cm/s AV VTI:            0.805 m AV Peak Grad:      48.0 mmHg AV Mean Grad:      29.0 mmHg LVOT Vmax:         87.50 cm/s LVOT Vmean:        56.300 cm/s LVOT VTI:          0.203 m LVOT/AV VTI ratio: 0.25  AORTA Ao Root diam: 3.50 cm MITRAL VALVE MV Area (PHT): 2.73 cm    SHUNTS MV Decel Time: 278 msec    Systemic VTI:  0.20 m MV E velocity: 58.70 cm/s  Systemic Diam: 2.40 cm MV A velocity: 85.70 cm/s MV E/A ratio:  0.68 Loralie Champagne MD Electronically signed by Loralie Champagne MD Signature Date/Time: 09/25/2020/3:08:51 PM    Final     Cardiac Studies   Echo 09/25/20 IMPRESSIONS    1. Left ventricular ejection fraction, by estimation, is 55%. The left  ventricle has normal function. The left ventricle has no regional wall  motion abnormalities. Left ventricular diastolic parameters are consistent  with Grade I diastolic dysfunction  (impaired relaxation).  2. Right ventricular systolic  function is mildly reduced. The right  ventricular size is normal. Tricuspid regurgitation signal is inadequate  for assessing PA pressure.  3. Left atrial size was mildly dilated.  4. Right atrial size was mildly dilated.  5. The mitral valve is normal in structure. Trivial mitral valve  regurgitation. No evidence of mitral stenosis.  6. The aortic valve is tricuspid. Aortic valve regurgitation is trivial.  Moderate aortic valve stenosis. Aortic valve mean gradient measures 29.0  mmHg, AVA 1.07 cm^2.  7. Aortic dilatation noted. There is mild dilatation of the aortic root,  measuring 38 mm.   FINDINGS  Left Ventricle: Left ventricular ejection fraction, by estimation, is  55%. The left ventricle has normal function. The left ventricle has no  regional wall motion abnormalities. The left ventricular internal cavity  size was normal in size. There is no  left ventricular hypertrophy. Left ventricular diastolic parameters are  consistent with Grade I diastolic dysfunction (impaired relaxation).   Right Ventricle: The right ventricular size is normal. No increase in  right ventricular wall thickness. Right ventricular systolic function is  mildly reduced. Tricuspid regurgitation signal is inadequate for assessing  PA pressure.   Left Atrium: Left atrial size was mildly dilated.   Right Atrium: Right atrial size was mildly dilated.   Pericardium: There is no evidence of pericardial effusion.   Mitral Valve: The mitral valve is normal in structure. Mild mitral annular  calcification. Trivial mitral valve regurgitation. No evidence of mitral  valve stenosis.   Tricuspid Valve: The tricuspid valve is normal in structure. Tricuspid  valve regurgitation is trivial.   Aortic Valve: The aortic valve is tricuspid. Aortic valve regurgitation is  trivial. Moderate aortic stenosis is present. Aortic valve mean gradient  measures 29.0 mmHg. Aortic valve peak gradient measures 48.0  mmHg. Aortic  valve area, by VTI measures 1.14  cm.   Pulmonic Valve: The pulmonic valve was normal in structure. Pulmonic valve  regurgitation is mild.   Aorta: Aortic dilatation noted. There is mild dilatation of the aortic  root, measuring 38 mm.   Patient Profile     87 y.o. male with a hx of CAD s/p CABG in 2000, carotid artery disease with known chronic left carotid artery occlusion, prior tobacco use, HTN, and HLD now with new PAF.   Assessment & Plan    PAF - with surgery increased bleeding, may be wise to begin IV heparin first then switch to DOAC.   Or just place on DOAC  Maintaining SR.   HTN  Controlled to slightly elevated at 145/66  Acute cholecystitis/s/p lap chole-stable  HLD on crestor.  CAD s/p CABG 2000 stable recent echo.   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  Will need follow up.     For questions or updates, please contact [facility] Please consult [email or web address] for contact info under        Signed, Cecilie Kicks, NP  09/27/2020, 8:54 AM    Patient seen and examined with Cecilie Kicks NP.  Agree as above, with the following exceptions and changes as noted below. No chest pain, SOB or palpitations. Post op abdominal pain. Gen: NAD, CV: RRR, no murmurs, Lungs: clear, Abd: soft, JP drain in place with scant red fluid, Extrem: Warm, well perfused, no edema, Neuro/Psych: alert and oriented x 3, normal mood and affect. All available labs, radiology testing, previous records reviewed.   Discussed in detail with patient and niece at bedside.  Per bedside nurse, JP drain may remain in place after patient's hospital discharge.   Dr. Bobbye Morton and I had a conversation by EMR yesterday regarding anticoagulation.  While anticoagulation for atrial fibrillation would overall be recommended, I defer initiation of DOAC to surgical team when they feel bleeding has been stabilized.  I discussed this with the patient and his niece, this could be for several days or weeks if needed.  We discussed the low but nonzero risk on a day-to-day basis of stroke  in the setting of no anticoagulation and paroxysmal atrial fibrillation.  They understand our decision making, and are in agreement.  The concern for postoperative bleeding is greater than the current concern for stroke and atrial fibrillation.  We will await formal recommendation from surgery to begin anticoagulation.  Elouise Munroe, MD 09/27/20 10:20 AM

## 2020-09-27 NOTE — Progress Notes (Addendum)
PROGRESS NOTE    Juan Benjamin  TWK:[id number] DOB: 03-02-1931 DOA: 09/24/2020 PCP: Richmond Campbell., PA-C    Chief Complaint  Patient presents with  . Abdominal Pain    Brief admission Narrative:   Juan Benjamin is a 87 y.o. male with medical history significant of CAD, carotid artery dz s/p CEA, HTN.  Pt presents to the ED with c/o abd pain.  Pain onset 3 hours PTA.  This onset was 2h after eating a peanut butter and banana sandwich.  Pain unchanged since onset.  No movement.  Pain is in RUQ.  Pain is constant, moderate.  No N/V/D.  No urinary symptoms, no constipation, no CP. Work-up was significant for acute cholecystitis, as well as finding of new diagnosis of atrial fibrillation.  Subjective:  Reports some constipation, reports abdominal pain better controlled today, denies any nausea or vomiting.she remains with poor oral intake.  Assessment & Plan:  1-acute cholecystitis - S/p laparoscopic subtotal cholecystectomy on 09/26/2020 by Dr. Bedelia Person -Continue Rocephin -No febrile at this moment and denies nausea or vomiting -WBCs trending down -LFTs downtrending -Tolerating soft diet -Continue as needed antiemetics and analgesics. -We will try to mobilize, PT/OT consulted, encouraged with incentive spirometer.  2-Hypertension -Overall stable and well-controlled -Continue current antihypertensive agents and follow vital signs.  3-New onset a-fib (HCC) -Appears to be paroxysmal in nature -Currently rate controlled -Following cardiology recommendations patient will need DOAC once clear by surgery to provide anticoagulation.  Okay from general surgery to resume anticoagulation, will start on Lovenox, transition to DOAC if hemoglobin remains stable and no evidence of bleed. -Continue telemetry monitoring  4-hyperlipidemia -Continue Crestor  5-hypothyroidism -Continue Synthroid  6- Transaminitis -Due to cholecystitis, trending down.  7. AAA -This is  incidental finding on imaging good, vascular surgery input greatly appreciated, recommendation for outpatient follow-up for possible need for surgical repair  DVT prophylaxis: lovenox Code Status: Full code Family Communication: Niece at bedside. Disposition:   Status is: INPATIENT  Dispo: The patient is from: Home              Anticipated d/c is to: Home              Patient currently not medically stable for discharge; status postcholecystectomy, demonstrating elevated LFTs and waiting for intestine to work-up.  Slowly advancing diet and will follow general surgery postoperative recommendations.   Difficult to place patient no       Consultants:   General surgery  Cardiology  Vascular surgery   Procedures:  Status post laparoscopic cholecystectomy 09/26/2020   Antimicrobials:  Rocephin      Objective: Vitals:   09/26/20 2151 09/27/20 0549 09/27/20 0817 09/27/20 1252  BP:  139/60 (!) 145/66 (!) 142/65  Pulse: 77 (!) 57 62 63  Resp:  17 17 17   Temp:  97.9 F (36.6 C) 98.8 F (37.1 C) (!) 97.5 F (36.4 C)  TempSrc:  Axillary Oral Oral  SpO2:  98% 91% 90%  Weight:      Height:        Intake/Output Summary (Last 24 hours) at 09/27/2020 1412 Last data filed at 09/27/2020 0900 Gross per 24 hour  Intake 1311.51 ml  Output 1480 ml  Net -168.49 ml   Filed Weights   09/24/20 1841  Weight: 65.8 kg    Examination:  Awake Alert, Oriented X 3, No new F.N deficits, Normal affect Symmetrical Chest wall movement, Good air movement bilaterally, CTAB RRR,No Gallops,Rubs or new Murmurs, No Parasternal Heave +  ve B.Sounds, Abd Soft, RUQ drain, No rebound - guarding or rigidity. No Cyanosis, Clubbing or edema, No new Rash or bruise       Data Reviewed: I have personally reviewed following labs and imaging studies  CBC: Recent Labs  Lab 09/24/20 1932 09/25/20 0500 09/26/20 0300 09/26/20 1440  WBC 11.4* 23.4* 16.3* 13.2*  NEUTROABS 8.2*  --   --   --   HGB  15.0 14.7 14.4 13.3  HCT 45.4 46.5 45.2 41.4  MCV 90.3 91.9 91.9 90.6  PLT 197 184 169 [id number]    Basic Metabolic Panel: Recent Labs  Lab 09/24/20 1932 09/25/20 0500 09/26/20 0300 09/27/20 0112  NA 141 140 139 138  K 4.7 4.5 4.0 4.4  CL 106 110 104 106  CO2 25 20* 25 26  GLUCOSE 122* 167* 99 125*  BUN 32* 26* 29* 25*  CREATININE 1.73* 1.48* 1.68* 1.45*  CALCIUM 10.2 10.3 9.8 9.0  MG  --   --  1.7  --   PHOS  --   --  3.0  --     GFR: Estimated Creatinine Clearance: 33.4 mL/min (A) (by C-G formula based on SCr of 1.45 mg/dL (H)).  Liver Function Tests: Recent Labs  Lab 09/24/20 1932 09/25/20 0500 09/26/20 0300 09/27/20 0112  AST 19 28 305* 84*  ALT 11 18 264* 130*  ALKPHOS 76 74 131* 86  BILITOT 0.4 1.0 2.6* 0.3  PROT 7.0 6.2* 6.0* 4.8*  ALBUMIN 3.8 3.4* 3.1* 2.3*    CBG: No results for input(s): GLUCAP in the last 168 hours.   Recent Results (from the past 240 hour(s))  Resp Panel by RT-PCR (Flu A&B, Covid) Nasopharyngeal Swab     Status: None   Collection Time: 09/24/20  9:25 PM   Specimen: Nasopharyngeal Swab; Nasopharyngeal(NP) swabs in vial transport medium  Result Value Ref Range Status   SARS Coronavirus 2 by RT PCR NEGATIVE NEGATIVE Final    Comment: (NOTE) SARS-CoV-2 target nucleic acids are NOT DETECTED.  The SARS-CoV-2 RNA is generally detectable in upper respiratory specimens during the acute phase of infection. The lowest concentration of SARS-CoV-2 viral copies this assay can detect is 138 copies/mL. A negative result does not preclude SARS-Cov-2 infection and should not be used as the sole basis for treatment or other patient management decisions. A negative result may occur with  improper specimen collection/handling, submission of specimen other than nasopharyngeal swab, presence of viral mutation(s) within the areas targeted by this assay, and inadequate number of viral copies(<138 copies/mL). A negative result must be combined  with clinical observations, patient history, and epidemiological information. The expected result is Negative.  Fact Sheet for Patients:  EntrepreneurPulse.com.au  Fact Sheet for Healthcare Providers:  IncredibleEmployment.be  This test is no t yet approved or cleared by the [place] FDA and  has been authorized for detection and/or diagnosis of SARS-CoV-2 by FDA under an Emergency Use Authorization (EUA). This EUA will remain  in effect (meaning this test can be used) for the duration of the COVID-19 declaration under Section 564(b)(1) of the Act, 21 U.S.C.section 360bbb-3(b)(1), unless the authorization is terminated  or revoked sooner.       Influenza A by PCR NEGATIVE NEGATIVE Final   Influenza B by PCR NEGATIVE NEGATIVE Final    Comment: (NOTE) The Xpert Xpress SARS-CoV-2/FLU/RSV plus assay is intended as an aid in the diagnosis of influenza from Nasopharyngeal swab specimens and should not be used as a sole basis for treatment.  Nasal washings and aspirates are unacceptable for Xpert Xpress SARS-CoV-2/FLU/RSV testing.  Fact Sheet for Patients: EntrepreneurPulse.com.au  Fact Sheet for Healthcare Providers: IncredibleEmployment.be  This test is not yet approved or cleared by the [place] FDA and has been authorized for detection and/or diagnosis of SARS-CoV-2 by FDA under an Emergency Use Authorization (EUA). This EUA will remain in effect (meaning this test can be used) for the duration of the COVID-19 declaration under Section 564(b)(1) of the Act, 21 U.S.C. section 360bbb-3(b)(1), unless the authorization is terminated or revoked.  Performed at [facility], [address] [place], [place] [address]   Surgical PCR screen     Status: None   Collection Time: 09/25/20 11:30 PM   Specimen: Nasal Mucosa; Nasal Swab  Result Value Ref Range Status   MRSA, PCR NEGATIVE NEGATIVE  Final   Staphylococcus aureus NEGATIVE NEGATIVE Final    Comment: (NOTE) The Xpert SA Assay (FDA approved for NASAL specimens in patients 54 years of age and older), is one component of a comprehensive surveillance program. It is not intended to diagnose infection nor to guide or monitor treatment. Performed at [facility], [place] [address] [place], [place] [address]      Radiology Studies: ECHOCARDIOGRAM COMPLETE  Result Date: 09/25/2020    ECHOCARDIOGRAM REPORT   Patient Name:   ORVIS STANN Date of Exam: 09/25/2020 Medical Rec #:  [id number]        Height:       67.0 in Accession #:    [id number]       Weight:       145.0 lb Date of Birth:  [birth year]        BSA:          1.764 m Patient Age:    87 years         BP:           115/54 mmHg Patient Gender: M                HR:           58 bpm. Exam Location:  Inpatient Procedure: 2D Echo Indications:    Atrial Fibrillation I48.91  History:        Patient has prior history of Echocardiogram examinations, most                 recent 02/25/2020. CHF, CAD, Aortic Valve Disease,                 Signs/Symptoms:Murmur; Risk Factors:Hypertension and                 Dyslipidemia.  Sonographer:    Mikki Santee RDCS (AE) Referring Phys: [place]  1. Left ventricular ejection fraction, by estimation, is 55%. The left ventricle has normal function. The left ventricle has no regional wall motion abnormalities. Left ventricular diastolic parameters are consistent with Grade I diastolic dysfunction (impaired relaxation).  2. Right ventricular systolic function is mildly reduced. The right ventricular size is normal. Tricuspid regurgitation signal is inadequate for assessing PA pressure.  3. Left atrial size was mildly dilated.  4. Right atrial size was mildly dilated.  5. The mitral valve is normal in structure. Trivial mitral valve regurgitation. No evidence of mitral stenosis.  6. The aortic valve is tricuspid. Aortic valve  regurgitation is trivial. Moderate aortic valve stenosis. Aortic valve mean gradient measures 29.0 mmHg, AVA 1.07 cm^2.  7. Aortic dilatation noted.  There is mild dilatation of the aortic root, measuring 38 mm. FINDINGS  Left Ventricle: Left ventricular ejection fraction, by estimation, is 55%. The left ventricle has normal function. The left ventricle has no regional wall motion abnormalities. The left ventricular internal cavity size was normal in size. There is no left ventricular hypertrophy. Left ventricular diastolic parameters are consistent with Grade I diastolic dysfunction (impaired relaxation). Right Ventricle: The right ventricular size is normal. No increase in right ventricular wall thickness. Right ventricular systolic function is mildly reduced. Tricuspid regurgitation signal is inadequate for assessing PA pressure. Left Atrium: Left atrial size was mildly dilated. Right Atrium: Right atrial size was mildly dilated. Pericardium: There is no evidence of pericardial effusion. Mitral Valve: The mitral valve is normal in structure. Mild mitral annular calcification. Trivial mitral valve regurgitation. No evidence of mitral valve stenosis. Tricuspid Valve: The tricuspid valve is normal in structure. Tricuspid valve regurgitation is trivial. Aortic Valve: The aortic valve is tricuspid. Aortic valve regurgitation is trivial. Moderate aortic stenosis is present. Aortic valve mean gradient measures 29.0 mmHg. Aortic valve peak gradient measures 48.0 mmHg. Aortic valve area, by VTI measures 1.14  cm. Pulmonic Valve: The pulmonic valve was normal in structure. Pulmonic valve regurgitation is mild. Aorta: Aortic dilatation noted. There is mild dilatation of the aortic root, measuring 38 mm. Venous: The inferior vena cava was not well visualized. IAS/Shunts: No atrial level shunt detected by color flow Doppler.  LEFT VENTRICLE PLAX 2D LVIDd:         4.30 cm  Diastology LVIDs:         3.20 cm  LV e' medial:     7.72 cm/s LV PW:         1.40 cm  LV E/e' medial:  7.6 LV IVS:        1.10 cm  LV e' lateral:   9.90 cm/s LVOT diam:     2.40 cm  LV E/e' lateral: 5.9 LV SV:         92 LV SV Index:   52 LVOT Area:     4.52 cm  RIGHT VENTRICLE RV S prime:     7.35 cm/s TAPSE (M-mode): 1.0 cm LEFT ATRIUM             Index       RIGHT ATRIUM           Index LA diam:        4.40 cm 2.49 cm/m  RA Area:     21.60 cm LA Vol (A2C):   56.1 ml 31.80 ml/m RA Volume:   59.30 ml  33.62 ml/m LA Vol (A4C):   49.0 ml 27.78 ml/m LA Biplane Vol: 53.2 ml 30.16 ml/m  AORTIC VALVE AV Area (Vmax):    1.14 cm AV Area (Vmean):   1.05 cm AV Area (VTI):     1.14 cm AV Vmax:           346.25 cm/s AV Vmean:          241.500 cm/s AV VTI:            0.805 m AV Peak Grad:      48.0 mmHg AV Mean Grad:      29.0 mmHg LVOT Vmax:         87.50 cm/s LVOT Vmean:        56.300 cm/s LVOT VTI:          0.203 m LVOT/AV VTI ratio: 0.25  AORTA Ao Root  diam: 3.50 cm MITRAL VALVE MV Area (PHT): 2.73 cm    SHUNTS MV Decel Time: 278 msec    Systemic VTI:  0.20 m MV E velocity: 58.70 cm/s  Systemic Diam: 2.40 cm MV A velocity: 85.70 cm/s MV E/A ratio:  0.68 Loralie Champagne MD Electronically signed by Loralie Champagne MD Signature Date/Time: 09/25/2020/3:08:51 PM    Final     Scheduled Meds: . amLODipine  5 mg Oral Daily  . aspirin EC  81 mg Oral QHS  . enoxaparin (LOVENOX) injection  65 mg Subcutaneous Once  . [START ON 09/28/2020] enoxaparin (LOVENOX) injection  65 mg Subcutaneous BID  . levothyroxine  88 mcg Oral Q0600  . lidocaine  1 patch Transdermal Q24H  . multivitamin  1 tablet Oral Daily  . polyethylene glycol  17 g Oral Daily  . propranolol  40 mg Oral QHS  . rosuvastatin  10 mg Oral QHS  . senna  2 tablet Oral Daily   Continuous Infusions: . cefTRIAXone (ROCEPHIN)  IV Stopped (09/26/20 1944)  . lactated ringers 50 mL/hr at 09/27/20 0746     LOS: 0 days    Time spent: 30 minutes   Phillips Climes, MD [facility]   To contact the  attending provider between 7A-7P or the covering provider during after hours 7P-7A, please log into the web site [email or web address] and access using universal [place] password for that web site. If you do not have the password, please call the hospital operator.  09/27/2020, 2:12 PM

## 2020-09-27 NOTE — Progress Notes (Signed)
[address] for heparin>>lovenox Indication: atrial fibrillation  Labs: Recent Labs    09/24/20 1932 09/24/20 2125 09/25/20 0500 09/25/20 1142 09/25/20 2144 09/26/20 0300 09/26/20 1440 09/27/20 0112  HGB 15.0  --  14.7  --   --  14.4 13.3  --   HCT 45.4  --  46.5  --   --  45.2 41.4  --   PLT 197  --  184  --   --  169 154  --   HEPARINUNFRC  --   --   --  0.41 0.46 0.52  --   --   CREATININE 1.73*  --  1.48*  --   --  1.68*  --  1.45*  TROPONINIHS 9 12  --   --   --   --   --   --     Assessment: 87yo male admitted for w/u of abdominal pain, AAA dissection ruled out, now in new Afib, to begin heparin.  Pt is s/p lap chole with drain in place. Plan to resume anticoagulation for his afib with Lovenox before transitioning to NOAC.   Scr 1.68>>CrCl 33 ml/min CBC wnl  Goal of Therapy:  Heparin level 0.3-0.7 units/ml Monitor platelets by anticoagulation protocol: Yes   Plan:  Lovenox 65mg  SQ q12 F/u bleeding  Onnie Boer, PharmD, BCIDP, AAHIVP, CPP Infectious Disease Pharmacist 09/27/2020 1:13 PM

## 2020-09-27 NOTE — Progress Notes (Addendum)
  Progress Note    09/27/2020 7:40 AM Hospital Day 3  Subjective:  Pt awake and no distress.  Niece at bedside and states he has bouts of confusion.  She tells me they could not get all of the gallbladder and left a drain.  Pt states he has occasional abdominal pain but appears better than when he came to the hospital.  afebrile  Vitals:   09/26/20 2151 09/27/20 0549  BP:  139/60  Pulse: 77 (!) 57  Resp:  17  Temp:  97.9 F (36.6 C)  SpO2:  98%    Physical Exam: General:  No distress Lungs:  Non labored Abdomen:  Soft, NT/ND   CBC    Component Value Date/Time   WBC 13.2 (H) 09/26/2020 1440   RBC 4.57 09/26/2020 1440   HGB 13.3 09/26/2020 1440   HCT 41.4 09/26/2020 1440   PLT 154 09/26/2020 1440   MCV 90.6 09/26/2020 1440   MCH 29.1 09/26/2020 1440   MCHC 32.1 09/26/2020 1440   RDW 14.5 09/26/2020 1440   LYMPHSABS 1.7 09/24/2020 1932   MONOABS 1.0 09/24/2020 1932   EOSABS 0.4 09/24/2020 1932   BASOSABS 0.1 09/24/2020 1932    BMET    Component Value Date/Time   NA 138 09/27/2020 0112   K 4.4 09/27/2020 0112   CL 106 09/27/2020 0112   CO2 26 09/27/2020 0112   GLUCOSE 125 (H) 09/27/2020 0112   BUN 25 (H) 09/27/2020 0112   CREATININE 1.45 (H) 09/27/2020 0112   CALCIUM 9.0 09/27/2020 0112   GFRNONAA 47 (L) 09/27/2020 0112   GFRAA (L) 11/08/2008 0420    52        The eGFR has been calculated using the MDRD equation. This calculation has not been validated in all clinical situations. eGFR's persistently <60 mL/min signify possible Chronic Kidney Disease.    INR No results found for: INR   Intake/Output Summary (Last 24 hours) at 09/27/2020 0740 Last data filed at 09/27/2020 0616 Gross per 24 hour  Intake 2311.51 ml  Output 1220 ml  Net 1091.51 ml     Assessment/Plan:  87 y.o. male admitted with abdominal pain s/p subtotal cholecystectomy pod 1 Hospital Day 3  -pt's abdominal pain appears improved this morning from admission.  -pt with carotid  disease that is followed by Dr. 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  Will need follow up.   Leontine Locket, PA-C [facility] [phone] 09/27/2020 7:40 AM    I agree with the above.  I have seen and evaluated the patient.  He has postoperative day 1 from laparoscopic subtotal cholecystectomy, which was the source of his abdominal pain.  He is feeling better this morning.  I discussed with the patient and his daughter that we would plan on repairing his aneurysm once he has recovered from his gallbladder surgery.  I will have him follow-up in my office in 3 months with a CT angiogram of the abdomen and pelvis.  Annamarie Major

## 2020-09-27 NOTE — Progress Notes (Signed)
Progress Note  1 Day Post-Op  Subjective: Patient reports abdomen is sore. Denies nausea. Drinking some this AM. Sat on side of bed yesterday but has not been up yet. Niece at bedside.   Objective: Vital signs in last 24 hours: Temp:  [97.6 F (36.4 C)-98.8 F (37.1 C)] 98.8 F (37.1 C) (05/04 0817) Pulse Rate:  [57-78] 62 (05/04 0817) Resp:  [17-20] 17 (05/04 0817) BP: (129-170)/(60-96) 145/66 (05/04 0817) SpO2:  [91 %-99 %] 91 % (05/04 0817) Last BM Date:  (PTA)  Intake/Output from previous day: 05/03 0701 - 05/04 0700 In: 2311.5 [P.O.:240; I.V.:1354.8; IV Piggyback:716.7] Out: 1220 [Urine:650; Drains:520; Blood:50] Intake/Output this shift: Total I/O In: -  Out: 250 [Urine:250]  PE: General: pleasant, WD, elderly male who is laying in bed in NAD HEENT: Sclera are anicteric.   Heart: regular, rate, and rhythm.   Lungs: CTAB, no wheezes, rhonchi, or rales noted.  Respiratory effort nonlabored Abd: soft, appropriately ttp, ND, +BS, incisions c/d/i, drain in RUQ with bile tinged SS fluid  MS: all 4 extremities are symmetrical with no cyanosis, clubbing, or edema.    Lab Results:  Recent Labs    09/26/20 0300 09/26/20 1440  WBC 16.3* 13.2*  HGB 14.4 13.3  HCT 45.2 41.4  PLT 169 154   BMET Recent Labs    09/26/20 0300 09/27/20 0112  NA 139 138  K 4.0 4.4  CL 104 106  CO2 25 26  GLUCOSE 99 125*  BUN 29* 25*  CREATININE 1.68* 1.45*  CALCIUM 9.8 9.0   PT/INR No results for input(s): LABPROT, INR in the last 72 hours. CMP     Component Value Date/Time   NA 138 09/27/2020 0112   K 4.4 09/27/2020 0112   CL 106 09/27/2020 0112   CO2 26 09/27/2020 0112   GLUCOSE 125 (H) 09/27/2020 0112   BUN 25 (H) 09/27/2020 0112   CREATININE 1.45 (H) 09/27/2020 0112   CALCIUM 9.0 09/27/2020 0112   PROT 4.8 (L) 09/27/2020 0112   ALBUMIN 2.3 (L) 09/27/2020 0112   AST 84 (H) 09/27/2020 0112   ALT 130 (H) 09/27/2020 0112   ALKPHOS 86 09/27/2020 0112   BILITOT 0.3  09/27/2020 0112   GFRNONAA 47 (L) 09/27/2020 0112   GFRAA (L) 11/08/2008 0420    52        The eGFR has been calculated using the MDRD equation. This calculation has not been validated in all clinical situations. eGFR's persistently <60 mL/min signify possible Chronic Kidney Disease.   Lipase     Component Value Date/Time   LIPASE 42 09/24/2020 1932       Studies/Results: ECHOCARDIOGRAM COMPLETE  Result Date: 09/25/2020    ECHOCARDIOGRAM REPORT   Patient Name:   Juan Benjamin Date of Exam: 09/25/2020 Medical Rec #:  [id number]        Height:       67.0 in Accession #:    [id number]       Weight:       145.0 lb Date of Birth:  07-Sep-1930        BSA:          1.764 m Patient Age:    87 years         BP:           115/54 mmHg Patient Gender: M                HR:  58 bpm. Exam Location:  Inpatient Procedure: 2D Echo Indications:    Atrial Fibrillation I48.91  History:        Patient has prior history of Echocardiogram examinations, most                 recent 02/25/2020. CHF, CAD, Aortic Valve Disease,                 Signs/Symptoms:Murmur; Risk Factors:Hypertension and                 Dyslipidemia.  Sonographer:    Mikki Santee RDCS (AE) Referring Phys: [place]  1. Left ventricular ejection fraction, by estimation, is 55%. The left ventricle has normal function. The left ventricle has no regional wall motion abnormalities. Left ventricular diastolic parameters are consistent with Grade I diastolic dysfunction (impaired relaxation).  2. Right ventricular systolic function is mildly reduced. The right ventricular size is normal. Tricuspid regurgitation signal is inadequate for assessing PA pressure.  3. Left atrial size was mildly dilated.  4. Right atrial size was mildly dilated.  5. The mitral valve is normal in structure. Trivial mitral valve regurgitation. No evidence of mitral stenosis.  6. The aortic valve is tricuspid. Aortic valve regurgitation is  trivial. Moderate aortic valve stenosis. Aortic valve mean gradient measures 29.0 mmHg, AVA 1.07 cm^2.  7. Aortic dilatation noted. There is mild dilatation of the aortic root, measuring 38 mm. FINDINGS  Left Ventricle: Left ventricular ejection fraction, by estimation, is 55%. The left ventricle has normal function. The left ventricle has no regional wall motion abnormalities. The left ventricular internal cavity size was normal in size. There is no left ventricular hypertrophy. Left ventricular diastolic parameters are consistent with Grade I diastolic dysfunction (impaired relaxation). Right Ventricle: The right ventricular size is normal. No increase in right ventricular wall thickness. Right ventricular systolic function is mildly reduced. Tricuspid regurgitation signal is inadequate for assessing PA pressure. Left Atrium: Left atrial size was mildly dilated. Right Atrium: Right atrial size was mildly dilated. Pericardium: There is no evidence of pericardial effusion. Mitral Valve: The mitral valve is normal in structure. Mild mitral annular calcification. Trivial mitral valve regurgitation. No evidence of mitral valve stenosis. Tricuspid Valve: The tricuspid valve is normal in structure. Tricuspid valve regurgitation is trivial. Aortic Valve: The aortic valve is tricuspid. Aortic valve regurgitation is trivial. Moderate aortic stenosis is present. Aortic valve mean gradient measures 29.0 mmHg. Aortic valve peak gradient measures 48.0 mmHg. Aortic valve area, by VTI measures 1.14  cm. Pulmonic Valve: The pulmonic valve was normal in structure. Pulmonic valve regurgitation is mild. Aorta: Aortic dilatation noted. There is mild dilatation of the aortic root, measuring 38 mm. Venous: The inferior vena cava was not well visualized. IAS/Shunts: No atrial level shunt detected by color flow Doppler.  LEFT VENTRICLE PLAX 2D LVIDd:         4.30 cm  Diastology LVIDs:         3.20 cm  LV e' medial:    7.72 cm/s LV PW:          1.40 cm  LV E/e' medial:  7.6 LV IVS:        1.10 cm  LV e' lateral:   9.90 cm/s LVOT diam:     2.40 cm  LV E/e' lateral: 5.9 LV SV:         92 LV SV Index:   52 LVOT Area:     4.52 cm  RIGHT  VENTRICLE RV S prime:     7.35 cm/s TAPSE (M-mode): 1.0 cm LEFT ATRIUM             Index       RIGHT ATRIUM           Index LA diam:        4.40 cm 2.49 cm/m  RA Area:     21.60 cm LA Vol (A2C):   56.1 ml 31.80 ml/m RA Volume:   59.30 ml  33.62 ml/m LA Vol (A4C):   49.0 ml 27.78 ml/m LA Biplane Vol: 53.2 ml 30.16 ml/m  AORTIC VALVE AV Area (Vmax):    1.14 cm AV Area (Vmean):   1.05 cm AV Area (VTI):     1.14 cm AV Vmax:           346.25 cm/s AV Vmean:          241.500 cm/s AV VTI:            0.805 m AV Peak Grad:      48.0 mmHg AV Mean Grad:      29.0 mmHg LVOT Vmax:         87.50 cm/s LVOT Vmean:        56.300 cm/s LVOT VTI:          0.203 m LVOT/AV VTI ratio: 0.25  AORTA Ao Root diam: 3.50 cm MITRAL VALVE MV Area (PHT): 2.73 cm    SHUNTS MV Decel Time: 278 msec    Systemic VTI:  0.20 m MV E velocity: 58.70 cm/s  Systemic Diam: 2.40 cm MV A velocity: 85.70 cm/s MV E/A ratio:  0.68 Loralie Champagne MD Electronically signed by Loralie Champagne MD Signature Date/Time: 09/25/2020/3:08:51 PM    Final     Anti-infectives: Anti-infectives (From admission, onward)   Start     Dose/Rate Route Frequency Ordered Stop   09/25/20 1730  cefTRIAXone (ROCEPHIN) 2 g in sodium chloride 0.9 % 100 mL IVPB        2 g 200 mL/hr over 30 Minutes Intravenous Every 24 hours 09/25/20 1634         Assessment/Plan CAD HTN HLD Hypothyroidism  Acute cholecystitis S/P laparoscopic subtotal cholecystectomy 09/26/20 Dr. Bobbye Morton - POD#1 - drain appears slightly bilious and bloody this AM - 520 cc out in 24 h, continue to monitor and drain teaching for patient's niece  - LFTs downtrending  - start to mobilize today   - will discuss abx duration with attending MD today   FEN: regular diet  VTE: ok to resume anticoagulation from  surgical standpoint, per cards  ID: rocephin 5/2>>  LOS: 0 days    Norm Parcel, [facility] Surgery 09/27/2020, 8:49 AM Please see Amion for pager number during day hours 7:00am-4:30pm

## 2020-09-27 NOTE — TOC Benefit Eligibility Note (Signed)
Transition of Care [facility]) Benefit Eligibility Note    Patient Details  Name: Juan Benjamin MRN: [id number] Date of Birth: January 29, 1931   Medication/Dose: Eliquis 5mg . bid for 30 day supply  Covered?: Yes  Tier: 3 Drug  Prescription Coverage [facility]erred [facility]  Spoke with Person/Company/Phone Number:: Per [employer] PH# [phone]  Co-Pay: $40.00  Prior Approval: No  Deductible:  (no-deductible)       Shelda Altes Phone Number: 09/27/2020, 3:32 PM

## 2020-09-28 DIAGNOSIS — K828 Other specified diseases of gallbladder: Secondary | ICD-10-CM

## 2020-09-28 DIAGNOSIS — K839 Disease of biliary tract, unspecified: Secondary | ICD-10-CM

## 2020-09-28 LAB — CBC
HCT: 37.6 % — ABNORMAL LOW (ref 39.0–52.0)
Hemoglobin: 12.4 g/dL — ABNORMAL LOW (ref 13.0–17.0)
MCH: 29.3 pg (ref 26.0–34.0)
MCHC: 33 g/dL (ref 30.0–36.0)
MCV: 88.9 fL (ref 80.0–100.0)
Platelets: 210 10*3/uL (ref 150–400)
RBC: 4.23 MIL/uL (ref 4.22–5.81)
RDW: 14.1 % (ref 11.5–15.5)
WBC: 13.2 10*3/uL — ABNORMAL HIGH (ref 4.0–10.5)
nRBC: 0 % (ref 0.0–0.2)

## 2020-09-28 LAB — COMPREHENSIVE METABOLIC PANEL
ALT: 83 U/L — ABNORMAL HIGH (ref 0–44)
AST: 38 U/L (ref 15–41)
Albumin: 2.5 g/dL — ABNORMAL LOW (ref 3.5–5.0)
Alkaline Phosphatase: 87 U/L (ref 38–126)
Anion gap: 9 (ref 5–15)
BUN: 29 mg/dL — ABNORMAL HIGH (ref 8–23)
CO2: 24 mmol/L (ref 22–32)
Calcium: 9.4 mg/dL (ref 8.9–10.3)
Chloride: 103 mmol/L (ref 98–111)
Creatinine, Ser: 1.39 mg/dL — ABNORMAL HIGH (ref 0.61–1.24)
GFR, Estimated: 49 mL/min — ABNORMAL LOW (ref >60)
Glucose, Bld: 98 mg/dL (ref 70–99)
Potassium: 4.3 mmol/L (ref 3.5–5.1)
Sodium: 136 mmol/L (ref 135–145)
Total Bilirubin: 0.8 mg/dL (ref 0.3–1.2)
Total Protein: 5.3 g/dL — ABNORMAL LOW (ref 6.5–8.1)

## 2020-09-28 LAB — SURGICAL PATHOLOGY

## 2020-09-28 MED ORDER — AMLODIPINE BESYLATE 5 MG PO TABS
5.0000 mg | ORAL_TABLET | Freq: Once | ORAL | Status: AC
  Administered 2020-09-28: 5 mg via ORAL
  Filled 2020-09-28: qty 1

## 2020-09-28 MED ORDER — GUAIFENESIN ER 600 MG PO TB12
600.0000 mg | ORAL_TABLET | Freq: Two times a day (BID) | ORAL | Status: DC
  Administered 2020-09-28 – 2020-10-04 (×12): 600 mg via ORAL
  Filled 2020-09-28 (×13): qty 1

## 2020-09-28 MED ORDER — AMLODIPINE BESYLATE 10 MG PO TABS
10.0000 mg | ORAL_TABLET | Freq: Every day | ORAL | Status: DC
  Administered 2020-09-29 – 2020-10-04 (×6): 10 mg via ORAL
  Filled 2020-09-28 (×6): qty 1

## 2020-09-28 MED ORDER — HALOPERIDOL LACTATE 5 MG/ML IJ SOLN
2.0000 mg | Freq: Four times a day (QID) | INTRAMUSCULAR | Status: DC | PRN
  Administered 2020-09-28 – 2020-10-01 (×2): 5 mg via INTRAVENOUS
  Filled 2020-09-28 (×3): qty 1

## 2020-09-28 NOTE — Progress Notes (Signed)
PROGRESS NOTE    ELISON WORREL  BOF:[id number] DOB: [birth year] DOA: 09/24/2020 PCP: Aletha Halim., PA-C    Chief Complaint  Patient presents with  . Abdominal Pain    Brief admission Narrative:   LOWEN BARRINGER is a 87 y.o. male with medical history significant of CAD, carotid artery dz s/p CEA, HTN.  Pt presents to the ED with c/o abd pain.  Pain onset 3 hours PTA.  This onset was 2h after eating a peanut butter and banana sandwich.  Pain unchanged since onset.  No movement.  Pain is in RUQ.  Pain is constant, moderate.  No N/V/D.  No urinary symptoms, no constipation, no CP. Work-up was significant for acute cholecystitis, as well as finding of new diagnosis of atrial fibrillation. Patient went for laparoscopic cholecystectomy 5/3, total cholecystectomy of necrotic bladder, patient with continued significant biliary leak through JP drain, GI consulted 5/5 for possible need of ERCP.  Subjective:  Patient with some nighttime delirium, and restlessness, this morning he denies any abdominal pain, nausea or vomiting.     Assessment & Plan:  1-acute cholecystitis - S/p laparoscopic subtotal cholecystectomy on 09/26/2020 by Dr. Bobbye Morton -Continue Rocephin -No febrile at this moment and denies nausea or vomiting -WBCs trending down -LFTs downtrending -Tolerating soft diet -Continue as needed antiemetics and analgesics. -We will try to mobilize, PT/OT consulted, encouraged with incentive spirometer. -Patient with significant drainage chronically bilious, GI consulted for suspected biliary leak, HIDA scan versus ERCP.  2-Hypertension -Overall stable and well-controlled -Continue current antihypertensive agents and follow vital signs.  3-New onset a-fib (HCC) -Appears to be paroxysmal in nature -Currently rate controlled -Following cardiology recommendations patient will need DOAC once clear by surgery to provide anticoagulation.   -full dose Lovenox times been held this  morning by general surgery for possible need of ERCP . -Continue telemetry monitoring  4-hyperlipidemia -Continue Crestor  5-hypothyroidism -Continue Synthroid  6- Transaminitis -Due to cholecystitis, trending down.  7. AAA -This is incidental finding on imaging good, vascular surgery input greatly appreciated, recommendation for outpatient follow-up for possible need for surgical repair  DVT prophylaxis: lovenox Code Status: Full code Family Communication: Niece at bedside. Disposition:   Status is: INPATIENT  Dispo: The patient is from: Home              Anticipated d/c is to: Home              Patient currently not medically stable for discharge; status postcholecystectomy, demonstrating elevated LFTs and waiting for intestine to work-up.  Slowly advancing diet and will follow general surgery postoperative recommendations.   Difficult to place patient no       Consultants:   General surgery  Cardiology  Vascular surgery  GI   Procedures:  Status post laparoscopic cholecystectomy 09/26/2020   Antimicrobials:  Rocephin      Objective: Vitals:   09/28/20 0020 09/28/20 0110 09/28/20 0606 09/28/20 1345  BP: (!) 164/79 (!) 149/81 (!) 149/77 125/60  Pulse: 63 73 61 66  Resp: 19 (!) 25 18 18   Temp: (!) 97.3 F (36.3 C)     TempSrc: Axillary     SpO2: 98% 98% 98% 100%  Weight:      Height:        Intake/Output Summary (Last 24 hours) at 09/28/2020 1432 Last data filed at 09/28/2020 1347 Gross per 24 hour  Intake 2094.86 ml  Output 925 ml  Net 1169.86 ml   Filed Weights   09/24/20 1841  Weight: 65.8 kg    Examination:  Awake Alert, he is pleasant, appropriate this morning Symmetrical Chest wall movement, Good air movement bilaterally, CTAB RRR,No Gallops,Rubs or new Murmurs, No Parasternal Heave +ve B.Sounds, Abd Soft, right upper quadrant drain present.  No tenderness, No rebound - guarding or rigidity. No Cyanosis, Clubbing or edema, No new  Rash or bruise     Data Reviewed: I have personally reviewed following labs and imaging studies  CBC: Recent Labs  Lab 09/24/20 1932 09/25/20 0500 09/26/20 0300 09/26/20 1440 09/28/20 0625  WBC 11.4* 23.4* 16.3* 13.2* 13.2*  NEUTROABS 8.2*  --   --   --   --   HGB 15.0 14.7 14.4 13.3 12.4*  HCT 45.4 46.5 45.2 41.4 37.6*  MCV 90.3 91.9 91.9 90.6 88.9  PLT 197 184 169 154 161    Basic Metabolic Panel: Recent Labs  Lab 09/24/20 1932 09/25/20 0500 09/26/20 0300 09/27/20 0112 09/28/20 0625  NA 141 140 139 138 136  K 4.7 4.5 4.0 4.4 4.3  CL 106 110 104 106 103  CO2 25 20* 25 26 24   GLUCOSE 122* 167* 99 125* 98  BUN 32* 26* 29* 25* 29*  CREATININE 1.73* 1.48* 1.68* 1.45* 1.39*  CALCIUM 10.2 10.3 9.8 9.0 9.4  MG  --   --  1.7  --   --   PHOS  --   --  3.0  --   --     GFR: Estimated Creatinine Clearance: 34.8 mL/min (A) (by C-G formula based on SCr of 1.39 mg/dL (H)).  Liver Function Tests: Recent Labs  Lab 09/24/20 1932 09/25/20 0500 09/26/20 0300 09/27/20 0112 09/28/20 0625  AST 19 28 305* 84* 38  ALT 11 18 264* 130* 83*  ALKPHOS 76 74 131* 86 87  BILITOT 0.4 1.0 2.6* 0.3 0.8  PROT 7.0 6.2* 6.0* 4.8* 5.3*  ALBUMIN 3.8 3.4* 3.1* 2.3* 2.5*    CBG: No results for input(s): GLUCAP in the last 168 hours.   Recent Results (from the past 240 hour(s))  Resp Panel by RT-PCR (Flu A&B, Covid) Nasopharyngeal Swab     Status: None   Collection Time: 09/24/20  9:25 PM   Specimen: Nasopharyngeal Swab; Nasopharyngeal(NP) swabs in vial transport medium  Result Value Ref Range Status   SARS Coronavirus 2 by RT PCR NEGATIVE NEGATIVE Final    Comment: (NOTE) SARS-CoV-2 target nucleic acids are NOT DETECTED.  The SARS-CoV-2 RNA is generally detectable in upper respiratory specimens during the acute phase of infection. The lowest concentration of SARS-CoV-2 viral copies this assay can detect is 138 copies/mL. A negative result does not preclude SARS-Cov-2 infection  and should not be used as the sole basis for treatment or other patient management decisions. A negative result may occur with  improper specimen collection/handling, submission of specimen other than nasopharyngeal swab, presence of viral mutation(s) within the areas targeted by this assay, and inadequate number of viral copies(<138 copies/mL). A negative result must be combined with clinical observations, patient history, and epidemiological information. The expected result is Negative.  Fact Sheet for Patients:  EntrepreneurPulse.com.au  Fact Sheet for Healthcare Providers:  IncredibleEmployment.be  This test is no t yet approved or cleared by the [place] FDA and  has been authorized for detection and/or diagnosis of SARS-CoV-2 by FDA under an Emergency Use Authorization (EUA). This EUA will remain  in effect (meaning this test can be used) for the duration of the COVID-19 declaration under Section 564(b)(1) of the  Act, 21 U.S.C.section 360bbb-3(b)(1), unless the authorization is terminated  or revoked sooner.       Influenza A by PCR NEGATIVE NEGATIVE Final   Influenza B by PCR NEGATIVE NEGATIVE Final    Comment: (NOTE) The Xpert Xpress SARS-CoV-2/FLU/RSV plus assay is intended as an aid in the diagnosis of influenza from Nasopharyngeal swab specimens and should not be used as a sole basis for treatment. Nasal washings and aspirates are unacceptable for Xpert Xpress SARS-CoV-2/FLU/RSV testing.  Fact Sheet for Patients: EntrepreneurPulse.com.au  Fact Sheet for Healthcare Providers: IncredibleEmployment.be  This test is not yet approved or cleared by the [place] FDA and has been authorized for detection and/or diagnosis of SARS-CoV-2 by FDA under an Emergency Use Authorization (EUA). This EUA will remain in effect (meaning this test can be used) for the duration of the COVID-19 declaration  under Section 564(b)(1) of the Act, 21 U.S.C. section 360bbb-3(b)(1), unless the authorization is terminated or revoked.  Performed at [facility], [address] [place], [place] [address]   Surgical PCR screen     Status: None   Collection Time: 09/25/20 11:30 PM   Specimen: Nasal Mucosa; Nasal Swab  Result Value Ref Range Status   MRSA, PCR NEGATIVE NEGATIVE Final   Staphylococcus aureus NEGATIVE NEGATIVE Final    Comment: (NOTE) The Xpert SA Assay (FDA approved for NASAL specimens in patients 58 years of age and older), is one component of a comprehensive surveillance program. It is not intended to diagnose infection nor to guide or monitor treatment. Performed at [facility], [place] [address], [place], [place] [address]      Radiology Studies: No results found.  Scheduled Meds: . [START ON 09/29/2020] amLODipine  10 mg Oral Daily  . aspirin EC  81 mg Oral QHS  . guaiFENesin  600 mg Oral BID  . levothyroxine  88 mcg Oral Q0600  . lidocaine  1 patch Transdermal Q24H  . multivitamin  1 tablet Oral Daily  . polyethylene glycol  17 g Oral Daily  . propranolol  40 mg Oral QHS  . rosuvastatin  10 mg Oral QHS  . senna  2 tablet Oral Daily   Continuous Infusions: . cefTRIAXone (ROCEPHIN)  IV Stopped (09/27/20 1900)  . lactated ringers 50 mL/hr at 09/28/20 1249     LOS: 1 day    Time spent: 28 minutes   Phillips Climes, MD [facility]   To contact the attending provider between 7A-7P or the covering provider during after hours 7P-7A, please log into the web site [email or web address] and access using universal [place] password for that web site. If you do not have the password, please call the hospital operator.  09/28/2020, 2:31 PM

## 2020-09-28 NOTE — Progress Notes (Addendum)
Progress Note  2 Days Post-Op  Subjective: Patient tired this AM, got haldol overnight for hallucinations. Niece at bedside. He is not reporting much pain or nausea. He ate well for breakfast this AM.   Objective: Vital signs in last 24 hours: Temp:  [97.3 F (36.3 C)-97.7 F (36.5 C)] 97.3 F (36.3 C) (05/05 0020) Pulse Rate:  [61-77] 61 (05/05 0606) Resp:  [17-25] 18 (05/05 0606) BP: (142-164)/(60-81) 149/77 (05/05 0606) SpO2:  [90 %-98 %] 98 % (05/05 0606) Last BM Date:  (PTA)  Intake/Output from previous day: 05/04 0701 - 05/05 0700 In: 1854.9 [P.O.:240; I.V.:1514.9; IV Piggyback:100] Out: 1350 [Urine:1060; Drains:290] Intake/Output this shift: No intake/output data recorded.  PE: General: pleasant, WD, elderly male who is laying in bed in NAD HEENT: Sclera are anicteric.   Heart: regular, rate, and rhythm.   Lungs: CTAB, no wheezes, rhonchi, or rales noted.  Respiratory effort nonlabored Abd: soft, appropriately ttp, ND, +BS, incisions c/d/i, drain in RUQ with frank bile this AM MS: all 4 extremities are symmetrical with no cyanosis, clubbing, or edema.   Lab Results:  Recent Labs    09/26/20 1440 09/28/20 0625  WBC 13.2* 13.2*  HGB 13.3 12.4*  HCT 41.4 37.6*  PLT 154 210   BMET Recent Labs    09/27/20 0112 09/28/20 0625  NA 138 136  K 4.4 4.3  CL 106 103  CO2 26 24  GLUCOSE 125* 98  BUN 25* 29*  CREATININE 1.45* 1.39*  CALCIUM 9.0 9.4   PT/INR No results for input(s): LABPROT, INR in the last 72 hours. CMP     Component Value Date/Time   NA 136 09/28/2020 0625   K 4.3 09/28/2020 0625   CL 103 09/28/2020 0625   CO2 24 09/28/2020 0625   GLUCOSE 98 09/28/2020 0625   BUN 29 (H) 09/28/2020 0625   CREATININE 1.39 (H) 09/28/2020 0625   CALCIUM 9.4 09/28/2020 0625   PROT 5.3 (L) 09/28/2020 0625   ALBUMIN 2.5 (L) 09/28/2020 0625   AST 38 09/28/2020 0625   ALT 83 (H) 09/28/2020 0625   ALKPHOS 87 09/28/2020 0625   BILITOT 0.8 09/28/2020 0625    GFRNONAA 49 (L) 09/28/2020 0625   GFRAA (L) 11/08/2008 0420    52        The eGFR has been calculated using the MDRD equation. This calculation has not been validated in all clinical situations. eGFR's persistently <60 mL/min signify possible Chronic Kidney Disease.   Lipase     Component Value Date/Time   LIPASE 42 09/24/2020 1932       Studies/Results: No results found.  Anti-infectives: Anti-infectives (From admission, onward)   Start     Dose/Rate Route Frequency Ordered Stop   09/25/20 1730  cefTRIAXone (ROCEPHIN) 2 g in sodium chloride 0.9 % 100 mL IVPB        2 g 200 mL/hr over 30 Minutes Intravenous Every 24 hours 09/25/20 1634         Assessment/Plan CAD HTN HLD Hypothyroidism Acute delirium - suspect somewhat due to sundowning, try to limit narcotics as able   Acute cholecystitis S/P laparoscopic subtotal cholecystectomy 09/26/20 Dr. Bobbye Morton - POD#2 - drain frankly bilious this AM - 290 cc last 24h - GI consult for suspected bile leak  - continue to mobilize  FEN: NPO  VTE: got lovenox this AM 0600 ID: rocephin 5/2>>  LOS: 1 day    Norm Parcel, [facility] Surgery 09/28/2020, 9:33 AM Please see  Amion for pager number during day hours 7:00am-4:30pm

## 2020-09-28 NOTE — Consult Note (Addendum)
Attending physician's note   I have taken an interval history, reviewed the chart and examined the patient. I agree with the Advanced Practitioner's note, impression, and recommendations as outlined.   87 year old male with medical history as outlined below s/p subtotal cholecystectomy on 09/26/2020 with JP drain placement, now with suspected persistent bile duct leak.  - Plan for ERCP tomorrow with Dr. Rush Landmark - Will follow up on biliary fluid analysis - N.p.o. at midnight  The indications, risks, and benefits of ERCP were explained to the patient and his daughter in detail. Risks include but are not limited to bleeding, perforation, adverse reaction to medications, pancreatitis, inability to cannulate duct, and cardiopulmonary compromise. Sequelae include but are not limited to the possibility of surgery, prolonged hospitalization, and mortality. The patient and his daughter both verbalized understanding and wish to proceed. All questions answered to the best of my ability.   [address] DO, FACG [phone] office                                                                                                                                                                                  [facility] Consult: 10:24 AM 09/28/2020  LOS: 1 day    Referring Provider: Dr Bobbye Morton.   Primary Care Physician:  Aletha Halim., PA-C at [facility] Primary Gastroenterologist:  unassigned     Reason for Consultation: Bile leak following subtotal cholecystectomy.   HPI: Juan Benjamin is a 87 y.o. male.  PMH CAD, CABG 2000.  Right CEA.  Melanoma.  Renal artery stent 2001 hypertension.  Hypothyroidism.  Heart murmur. May have had remote colonoscopy but does not have a GI doctor.  Presented to [place] and then admitted to [facility] with epigastric abdominal pain without nausea or vomiting. Initially LFTs normal but began to climb to  maximum of T bili 2.6, alk phos 131, AST/ALT 305/264.  Additionally had AKI. CTAP w contrast: Moderate gastric distention.  GB distended.  1.1 cm CBD.   Infrarenal abdominal aortic aneurysms proximal aneurysm is 3.8 x 3.5 cm, distal aneurysm is 3.8 x 3.8 cm extends to left common iliac where there is a 3.9 cm aneurysm.  High-grade right common iliac artery stenosis.  High-grade stenosis in right CFA and right SFA. Abdominal ultrasound.  Gallbladder sludge, no gallstones.  Gallbladder wall 4 mm thick.  CBD 5.4 mm.  Normal liver.  Normal portal vein Dopplers.  Perihepatic free fluid.  Ultimately underwent laparoscopic, subtotal cholecystectomy and lysis of adhesions on 5/3.  Gallbladder was tense and adherent to omentum, necrotic.  Bile spilled.  Unable to identify infundibulum due to adhesions and decision was made to  perform subtotal cholecystectomy inner wall gallbladder cauterized.  JP drain placed into residual gallbladder cavity.  There was no intraoperative cholangiogram.  Had preoperative cardiology evaluation as he was experiencing new onset A. Fib. 2D echo with LVEF 50 to 55%.  Calcified aortic valve with moderate aortic regurg.  Propanolol initiated for rate control.  Anticoagulation ultimately discontinued due to need for surgery.  Plans for future long-term anticoagulation TBD but may not be necessary due to brief episode of A. Fib.  Yesterday the JP drain put out more of a sanguinous looking liquid.  This morning the output had turned dark, bilious. JP drain output was 520... 290 mL on previous couple of days.  So far 75 mL. JP bulb was just emptied and now contains sanguinous looking material. Patient denies abdominal pain.  Appetite not great but tolerating solid food.  Lifteez continue to improve but not yet normal. WBCs have gone from 23.4 >> 18.2  During hospitalization has been experiencing sundowning and episodes of delirium.  It may not help that he has hearing loss.  At home he  has stable but overall reduced appetite but no nausea, vomiting, dysphagia. Lives alone.  Very active.  Does not smoke.  Does not drink.  Past Medical History:  Diagnosis Date  . Benign tumor    gland hyperplasia  . Carotid artery occlusion    Chronic occlusion of left, R CEA  . Constipation   . Coronary artery disease   . GERD (gastroesophageal reflux disease)   . Heart murmur   . Hyperlipidemia   . Hypertension   . Hypothyroidism   . Pneumonia   . Prostate cancer [facility])     Past Surgical History:  Procedure Laterality Date  . CAROTID ARTERY SURGERY     Right   . CHOLECYSTECTOMY N/A 09/26/2020   Procedure: LAPAROSCOPIC SUBTOTAL CHOLECYSTECTOMY;  Surgeon: Jesusita Oka, MD;  Location: [place];  Service: General;  Laterality: N/A;  . CORONARY ARTERY BYPASS GRAFT     CABG (4 vessel, 2000.  No report in [place])  . MELANOMA EXCISION WITH SENTINEL LYMPH NODE BIOPSY Left 03/21/2020   Procedure: WIDE LOCAL EXCISION LEFT UPPER ARM MELANOMA ADVANCEMENT FLAP CLOSURE FOR DEFECT 4X8CM SENTINEL LYMPH NODE MAPPING AND BIOPSY;  Surgeon: Stark Klein, MD;  Location: [place];  Service: General;  Laterality: Left;  . RENAL ARTERY STENT  07/02/1999   RIGHT SIDE    Prior to Admission medications   Medication Sig Start Date End Date Taking? Authorizing Provider  acetaminophen (TYLENOL) 325 MG tablet Take 325-650 mg by mouth every 6 (six) hours as needed for moderate pain (ear pain).    Yes [provider]  allopurinol (ZYLOPRIM) 100 MG tablet Take 100 mg by mouth at bedtime.    Yes [provider]  amLODipine (NORVASC) 5 MG tablet Take 5 mg by mouth daily.  12/16/16  Yes [provider]  aspirin 81 MG tablet Take 1 tablet (81 mg total) by mouth daily. Patient taking differently: Take 81 mg by mouth at bedtime. 02/24/20  Yes Minus Breeding, MD  benazepril (LOTENSIN) 10 MG tablet Take 10 mg by mouth at bedtime.   Yes [provider]  Cholecalciferol (VITAMIN D-3) 125  MCG (5000 UT) TABS Take 5,000 Units by mouth daily.   Yes [provider]  EPINEPHrine 0.3 mg/0.3 mL IJ SOAJ injection Inject 0.3 mLs (0.3 mg total) into the muscle as needed for anaphylaxis. 12/16/19  Yes Domenic Moras, PA-C  ibuprofen (ADVIL,MOTRIN) 200 MG  tablet Take 200 mg by mouth every 6 (six) hours as needed for mild pain or moderate pain (ear pain).    Yes [provider]  levothyroxine (SYNTHROID) 88 MCG tablet    Yes [provider]  multivitamin-lutein (OCUVITE-LUTEIN) CAPS capsule Take 1 capsule by mouth daily.   Yes [provider]  propranolol (INDERAL) 40 MG tablet Take 40 mg by mouth at bedtime.   Yes [provider]  rosuvastatin (CRESTOR) 20 MG tablet Take 1 tablet (20 mg total) by mouth daily. Patient taking differently: Take 10 mg by mouth at bedtime. 03/17/20 06/15/20 Yes HochreinJeneen Rinks, MD  vitamin B-12 (CYANOCOBALAMIN) 1000 MCG tablet Take 1,000 mcg by mouth daily.   Yes [provider]  zinc oxide (BALMEX) 11.3 % CREA cream Apply 1 application topically daily as needed (pressure spot).   Yes [provider]  oxyCODONE (OXY IR/ROXICODONE) 5 MG immediate release tablet Take 0.5-1 tablets (2.5-5 mg total) by mouth every 6 (six) hours as needed for severe pain. Patient not taking: Reported on 09/24/2020 03/21/20   Stark Klein, MD  ranitidine (ZANTAC) 150 MG tablet Take 1 tablet (150 mg total) by mouth 2 (two) times daily. Patient not taking: Reported on 01/20/2017 11/28/16 12/16/19  Palumbo, April, MD    Scheduled Meds: . amLODipine  5 mg Oral Daily  . aspirin EC  81 mg Oral QHS  . levothyroxine  88 mcg Oral Q0600  . lidocaine  1 patch Transdermal Q24H  . multivitamin  1 tablet Oral Daily  . polyethylene glycol  17 g Oral Daily  . propranolol  40 mg Oral QHS  . rosuvastatin  10 mg Oral QHS  . senna  2 tablet Oral Daily   Infusions: . cefTRIAXone (ROCEPHIN)  IV Stopped (09/27/20 1900)  . lactated ringers Stopped  (09/28/20 0145)   PRN Meds: acetaminophen **OR** acetaminophen, haloperidol lactate, hydrALAZINE, morphine injection, ondansetron **OR** ondansetron (ZOFRAN) IV, traMADol   Allergies as of 09/24/2020 - Review Complete 09/24/2020  Allergen Reaction Noted  . Wasp venom Anaphylaxis 01/28/2017  . Bee venom  01/20/2017  . Crestor [rosuvastatin]  03/16/2020  . Gemfibrozil Other (See Comments) 04/13/2008  . Indomethacin Other (See Comments) 03/12/2016  . Lipitor [atorvastatin]  03/16/2020  . Lisinopril Cough 08/04/2009  . Rofecoxib Other (See Comments) 03/12/2016  . Sildenafil Other (See Comments) 04/10/2015  . Simvastatin Other (See Comments) 04/13/2008    Family History  Problem Relation Age of Onset  . Bone cancer Mother     Social History   Socioeconomic History  . Marital status: Single    Spouse name: Not on file  . Number of children: Not on file  . Years of education: Not on file  . Highest education level: Not on file  Occupational History  . Occupation: Retired  Tobacco Use  . Smoking status: Former Smoker    Quit date: 12/12/1980    Years since quitting: 39.8  . Smokeless tobacco: Never Used  Vaping Use  . Vaping Use: Never used  Substance and Sexual Activity  . Alcohol use: No  . Drug use: No  . Sexual activity: Not on file  Other Topics Concern  . Not on file  Social History Narrative   Lives at home with wife.  1 son   Social Determinants of Radio broadcast assistant Strain: Not on file  Food Insecurity: Not on file  Transportation Needs: Not on file  Physical Activity: Not on file  Stress: Not on file  Social Connections: Not on file  Intimate Partner Violence: Not on file    REVIEW OF SYSTEMS: Constitutional: No weakness, no fatigue. Patient is generally very active, mows his own lawn.  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  Very active in his church.  Performs all his own ADLs. ENT:  No nose bleeds Pulm: No  shortness of breath.  No cough. CV:  No angina, no LE edema.  GU:  No hematuria, no frequency GI: See HPI. Heme: Denies unusual or excessive bleeding or bruising. Transfusions: None. Neuro:  No headaches, no peripheral tingling or numbness no syncope, no seizures. Derm:  No itching, no rash or sores.  Endocrine:  No sweats or chills.  No polyuria or dysuria Immunization: Vaccinated and boosted against COVID-19. Marland Kitchen    PHYSICAL EXAM: Vital signs in last 24 hours: Vitals:   09/28/20 0110 09/28/20 0606  BP: (!) 149/81 (!) 149/77  Pulse: 73 61  Resp: (!) 25 18  Temp:    SpO2: 98% 98%   Wt Readings from Last 3 Encounters:  09/24/20 65.8 kg  03/21/20 66.7 kg  03/16/20 66.7 kg    General: Patient is hard of hearing.  He looks well.  Sitting in the lounge chair comfortable. Head: No facial asymmetry or swelling.  No signs of head trauma. Eyes: No scleral icterus.  EOMI.  No conjunctival pallor. Ears: Hard of hearing but if you drop your mask and stay within a few feet of the patient he understands just fine. Nose: No congestion or discharge Mouth: Tongue midline.  Mucosa moist, pink, clear.  Dentures in place. Neck: No JVD, no masses, no thyromegaly Lungs: Clear.  Absent breath sounds on the right.  No labored breathing or cough. Heart: RRR.  Soft systolic somewhat harsh quality murmur.  S1, S2 present Abdomen: Soft without tenderness.  Not distended.  Active bowel sounds.  No HSM, masses, bruits, hernias.  Surgical incision sites benign, intact.   Rectal: Deferred Musc/Skeltl: No joint redness, swelling or gross deformity. Extremities: No CCE. Neurologic: Appropriate.  Not confused.  Hard of hearing.  Moves all 4 limbs without tremor, strength not tested Skin: No jaundice, no rash, no sores Nodes: No cervical adenopathy Psych: Calm, pleasant, cooperative.  Intake/Output from previous day: 05/04 0701 - 05/05 0700 In: 1854.9 [P.O.:240; I.V.:1514.9; IV Piggyback:100] Out: 1350  [Urine:1060; Drains:290] Intake/Output this shift: No intake/output data recorded.  LAB RESULTS: Recent Labs    09/26/20 0300 09/26/20 1440 09/28/20 0625  WBC 16.3* 13.2* 13.2*  HGB 14.4 13.3 12.4*  HCT 45.2 41.4 37.6*  PLT 169 154 210   BMET Lab Results  Component Value Date   NA 136 09/28/2020   NA 138 09/27/2020   NA 139 09/26/2020   K 4.3 09/28/2020   K 4.4 09/27/2020   K 4.0 09/26/2020   CL 103 09/28/2020   CL 106 09/27/2020   CL 104 09/26/2020   CO2 24 09/28/2020   CO2 26 09/27/2020   CO2 25 09/26/2020   GLUCOSE 98 09/28/2020   GLUCOSE 125 (H) 09/27/2020   GLUCOSE 99 09/26/2020   BUN 29 (H) 09/28/2020   BUN 25 (H) 09/27/2020   BUN 29 (H) 09/26/2020   CREATININE 1.39 (H) 09/28/2020   CREATININE 1.45 (H) 09/27/2020   CREATININE 1.68 (H) 09/26/2020   CALCIUM 9.4 09/28/2020   CALCIUM 9.0 09/27/2020   CALCIUM 9.8 09/26/2020   LFT Recent Labs    09/26/20 0300 09/27/20 0112 09/28/20 0960  PROT 6.0* 4.8* 5.3*  ALBUMIN 3.1* 2.3* 2.5*  AST 305* 84* 38  ALT 264* 130* 83*  ALKPHOS 131* 86 87  BILITOT 2.6* 0.3 0.8   PT/INR No results found for: INR, PROTIME Hepatitis Panel No results for input(s): HEPBSAG, HCVAB, HEPAIGM, HEPBIGM in the last 72 hours. C-Diff No components found for: CDIFF Lipase     Component Value Date/Time   LIPASE 42 09/24/2020 1932    Drugs of Abuse  No results found for: LABOPIA, COCAINSCRNUR, LABBENZ, AMPHETMU, THCU, LABBARB   RADIOLOGY STUDIES: No results found.    IMPRESSION:   *    Suspected postop bile leak following 5/3 subtotal cholecystectomy of necrotic gallbladder. Elevated LFTs improving.  *  PAF.  Not on blood thinner    PLAN:     *    Tentatively set up for ERCP with biliary stent placement tomorrow afternoon but want Dr. Bryan Lemma and Mansouraty to review the case.   Ordered bili level on JP drainage at request of Dr Jerilynn Mages.   I discussed the possibility of ERCP with the patient and his niece/HC Burr [phone].  They are agreeable to proceed if that is what GI pursues.     *   No need for him to be n.p.o. today.  Ordered regular diet which he tolerated this morning and n.p.o. after midnight.  Azucena Freed  09/28/2020, 10:24 AM Phone [phone]

## 2020-09-28 NOTE — Evaluation (Signed)
Physical Therapy Evaluation Patient Details Name: Juan Benjamin MRN: [id number] DOB: 08/15/1930 Today's Date: 09/28/2020   History of Present Illness  Pt is an 87 year old mand admitted with abdominal pain, + acute cholecystitis. Underwent subtotal laproscopic cholecystectomy on 09/26/20 with drain placement. Hospital course complicated by new onset afib. PMH: CAD s/p CABG, CEA, HTN, prostate ca, melanoma, hypothyroidism, HLD.  Clinical Impression  Pt fully participated in evaluation. Pt I PTA without use of AD. Currently pt with increased confusion and deficits in balance requiring assist from therapist during OOB functional mobility tasks. Pt will benefit from skilled PT to address deficits in balance, gait and safety with DME to maximize independence with functional mobility prior to discharge.     Follow Up Recommendations Home health PT    Equipment Recommendations  Rolling walker with 5" wheels    Recommendations for Other Services       Precautions / Restrictions Precautions Precautions: Fall Restrictions Weight Bearing Restrictions: No      Mobility  Bed Mobility               General bed mobility comments: found sitting EOB    Transfers Overall transfer level: Needs assistance Equipment used: Rolling walker (2 wheeled) Transfers: Sit to/from Stand Sit to Stand: Supervision            Ambulation/Gait Ambulation/Gait assistance: Min guard;Supervision;Min assist Gait Distance (Feet): 400 Feet Assistive device: Rolling walker (2 wheeled);None       General Gait Details: min A without RW, min guard initially with RW for safety and positioning, progressed to [employer]    Modified Rankin (Stroke Patients Only)       Balance Overall balance assessment: Mild deficits observed, not formally tested                                           Pertinent Vitals/Pain Pain Assessment: No/denies pain     Home Living Family/patient expects to be discharged to:: Private residence Living Arrangements: Alone Available Help at Discharge: Family;Available PRN/intermittently (family is able to assist at home, neice works but can assist initially wtih 24 hr) Type of Home: House Home Access: Stairs to enter Entrance Stairs-Rails: Right;Left;Can reach both Technical brewer of Steps: 4 Home Layout: One level Home Equipment: None      Prior Function Level of Independence: Independent         Comments: active     Hand Dominance   Dominant Hand: Right    Extremity/Trunk Assessment   Upper Extremity Assessment Upper Extremity Assessment: Defer to OT evaluation    Lower Extremity Assessment Lower Extremity Assessment: Overall WFL for tasks assessed    Cervical / Trunk Assessment Cervical / Trunk Assessment: Kyphotic  Communication   Communication: HOH  Cognition Arousal/Alertness: Awake/alert Behavior During Therapy: WFL for tasks assessed/performed Overall Cognitive Status: Within Functional Limits for tasks assessed                                 General Comments: VSS on RA. Niece with increased concerns regarding cognition and being disoriented while in the hospital. Niece can provide assist initially at d/c but will need to return to work. Increased time spent educating family on house set up  and use of RW to increas safety and decrease fall risk. verbalized understanding. interested in life alert      General Comments General comments (skin integrity, edema, etc.): VSS on RA. Niece with increased concerns regarding cognition and being disoriented while in the hospital. Niece can provide assist initially at d/c but will need to return to work. Increased time spent educating family on house set up and use of RW to increas safety and decrease fall risk. verbalized understanding. interested in life alert    Exercises     Assessment/Plan    PT  Assessment Patient needs continued PT services  PT Problem List Decreased coordination;Decreased balance;Decreased knowledge of use of DME       PT Treatment Interventions DME instruction;Gait training;Balance training;Patient/family education    PT Goals (Current goals can be found in the Care Plan section)  Acute Rehab PT Goals Patient Stated Goal: to go home PT Goal Formulation: With patient/family Time For Goal Achievement: 10/12/20 Potential to Achieve Goals: Good    Frequency Min 3X/week   Barriers to discharge        Co-evaluation               AM-PAC PT "6 Clicks" Mobility  Outcome Measure Help needed turning from your back to your side while in a flat bed without using bedrails?: None Help needed moving from lying on your back to sitting on the side of a flat bed without using bedrails?: None Help needed moving to and from a bed to a chair (including a wheelchair)?: A Little Help needed standing up from a chair using your arms (e.g., wheelchair or bedside chair)?: A Little Help needed to walk in hospital room?: A Little Help needed climbing 3-5 steps with a railing? : A Little 6 Click Score: 20    End of Session Equipment Utilized During Treatment: Gait belt Activity Tolerance: Patient tolerated treatment well Patient left: with family/visitor present;in bed Nurse Communication: Mobility status PT Visit Diagnosis: Unsteadiness on feet (R26.81);Other abnormalities of gait and mobility (R26.89)    Time: 5427-0623 PT Time Calculation (min) (ACUTE ONLY): 28 min   Charges:     PT Treatments $Therapeutic Activity: 8-22 mins        Lyanne Co, DPT Acute Rehabilitation Services [phone]  [place] 09/28/2020, 1:26 PM

## 2020-09-28 NOTE — Progress Notes (Signed)
Ok to increase amlodipine to 10mg  qday for BP per Dr. Waldron Labs.  Onnie Boer, PharmD, BCIDP, AAHIVP, CPP Infectious Disease Pharmacist 09/28/2020 10:40 AM

## 2020-09-28 NOTE — H&P (View-Only) (Signed)
Attending physician's note   I have taken an interval history, reviewed the chart and examined the patient. I agree with the Advanced Practitioner's note, impression, and recommendations as outlined.   87 year old male with medical history as outlined below s/p subtotal cholecystectomy on 09/26/2020 with JP drain placement, now with suspected persistent bile duct leak.  - Plan for ERCP tomorrow with Dr. Rush Landmark - Will follow up on biliary fluid analysis - N.p.o. at midnight  The indications, risks, and benefits of ERCP were explained to the patient and his daughter in detail. Risks include but are not limited to bleeding, perforation, adverse reaction to medications, pancreatitis, inability to cannulate duct, and cardiopulmonary compromise. Sequelae include but are not limited to the possibility of surgery, prolonged hospitalization, and mortality. The patient and his daughter both verbalized understanding and wish to proceed. All questions answered to the best of my ability.   [address] DO, FACG [phone] office                                                                                                                                                                                  [facility] Consult: 10:24 AM 09/28/2020  LOS: 1 day    Referring Provider: Dr Bobbye Morton.   Primary Care Physician:  Aletha Halim., PA-C at [facility] Primary Gastroenterologist:  unassigned     Reason for Consultation: Bile leak following subtotal cholecystectomy.   HPI: Juan Benjamin is a 87 y.o. male.  PMH CAD, CABG 2000.  Right CEA.  Melanoma.  Renal artery stent 2001 hypertension.  Hypothyroidism.  Heart murmur. May have had remote colonoscopy but does not have a GI doctor.  Presented to [place] and then admitted to [facility] with epigastric abdominal pain without nausea or vomiting. Initially LFTs normal but began to climb to  maximum of T bili 2.6, alk phos 131, AST/ALT 305/264.  Additionally had AKI. CTAP w contrast: Moderate gastric distention.  GB distended.  1.1 cm CBD.   Infrarenal abdominal aortic aneurysms proximal aneurysm is 3.8 x 3.5 cm, distal aneurysm is 3.8 x 3.8 cm extends to left common iliac where there is a 3.9 cm aneurysm.  High-grade right common iliac artery stenosis.  High-grade stenosis in right CFA and right SFA. Abdominal ultrasound.  Gallbladder sludge, no gallstones.  Gallbladder wall 4 mm thick.  CBD 5.4 mm.  Normal liver.  Normal portal vein Dopplers.  Perihepatic free fluid.  Ultimately underwent laparoscopic, subtotal cholecystectomy and lysis of adhesions on 5/3.  Gallbladder was tense and adherent to omentum, necrotic.  Bile spilled.  Unable to identify infundibulum due to adhesions and decision was made to  perform subtotal cholecystectomy inner wall gallbladder cauterized.  JP drain placed into residual gallbladder cavity.  There was no intraoperative cholangiogram.  Had preoperative cardiology evaluation as he was experiencing new onset A. Fib. 2D echo with LVEF 50 to 55%.  Calcified aortic valve with moderate aortic regurg.  Propanolol initiated for rate control.  Anticoagulation ultimately discontinued due to need for surgery.  Plans for future long-term anticoagulation TBD but may not be necessary due to brief episode of A. Fib.  Yesterday the JP drain put out more of a sanguinous looking liquid.  This morning the output had turned dark, bilious. JP drain output was 520... 290 mL on previous couple of days.  So far 75 mL. JP bulb was just emptied and now contains sanguinous looking material. Patient denies abdominal pain.  Appetite not great but tolerating solid food.  Lifteez continue to improve but not yet normal. WBCs have gone from 23.4 >> 18.2  During hospitalization has been experiencing sundowning and episodes of delirium.  It may not help that he has hearing loss.  At home he  has stable but overall reduced appetite but no nausea, vomiting, dysphagia. Lives alone.  Very active.  Does not smoke.  Does not drink.  Past Medical History:  Diagnosis Date  . Benign tumor    gland hyperplasia  . Carotid artery occlusion    Chronic occlusion of left, R CEA  . Constipation   . Coronary artery disease   . GERD (gastroesophageal reflux disease)   . Heart murmur   . Hyperlipidemia   . Hypertension   . Hypothyroidism   . Pneumonia   . Prostate cancer [facility])     Past Surgical History:  Procedure Laterality Date  . CAROTID ARTERY SURGERY     Right   . CHOLECYSTECTOMY N/A 09/26/2020   Procedure: LAPAROSCOPIC SUBTOTAL CHOLECYSTECTOMY;  Surgeon: Jesusita Oka, MD;  Location: [place];  Service: General;  Laterality: N/A;  . CORONARY ARTERY BYPASS GRAFT     CABG (4 vessel, 2000.  No report in [place])  . MELANOMA EXCISION WITH SENTINEL LYMPH NODE BIOPSY Left 03/21/2020   Procedure: WIDE LOCAL EXCISION LEFT UPPER ARM MELANOMA ADVANCEMENT FLAP CLOSURE FOR DEFECT 4X8CM SENTINEL LYMPH NODE MAPPING AND BIOPSY;  Surgeon: Stark Klein, MD;  Location: [place];  Service: General;  Laterality: Left;  . RENAL ARTERY STENT  07/02/1999   RIGHT SIDE    Prior to Admission medications   Medication Sig Start Date End Date Taking? Authorizing Provider  acetaminophen (TYLENOL) 325 MG tablet Take 325-650 mg by mouth every 6 (six) hours as needed for moderate pain (ear pain).    Yes [provider]  allopurinol (ZYLOPRIM) 100 MG tablet Take 100 mg by mouth at bedtime.    Yes [provider]  amLODipine (NORVASC) 5 MG tablet Take 5 mg by mouth daily.  12/16/16  Yes [provider]  aspirin 81 MG tablet Take 1 tablet (81 mg total) by mouth daily. Patient taking differently: Take 81 mg by mouth at bedtime. 02/24/20  Yes Minus Breeding, MD  benazepril (LOTENSIN) 10 MG tablet Take 10 mg by mouth at bedtime.   Yes [provider]  Cholecalciferol (VITAMIN D-3) 125  MCG (5000 UT) TABS Take 5,000 Units by mouth daily.   Yes [provider]  EPINEPHrine 0.3 mg/0.3 mL IJ SOAJ injection Inject 0.3 mLs (0.3 mg total) into the muscle as needed for anaphylaxis. 12/16/19  Yes Domenic Moras, PA-C  ibuprofen (ADVIL,MOTRIN) 200 MG  tablet Take 200 mg by mouth every 6 (six) hours as needed for mild pain or moderate pain (ear pain).    Yes [provider]  levothyroxine (SYNTHROID) 88 MCG tablet    Yes [provider]  multivitamin-lutein (OCUVITE-LUTEIN) CAPS capsule Take 1 capsule by mouth daily.   Yes [provider]  propranolol (INDERAL) 40 MG tablet Take 40 mg by mouth at bedtime.   Yes [provider]  rosuvastatin (CRESTOR) 20 MG tablet Take 1 tablet (20 mg total) by mouth daily. Patient taking differently: Take 10 mg by mouth at bedtime. 03/17/20 06/15/20 Yes HochreinJeneen Rinks, MD  vitamin B-12 (CYANOCOBALAMIN) 1000 MCG tablet Take 1,000 mcg by mouth daily.   Yes [provider]  zinc oxide (BALMEX) 11.3 % CREA cream Apply 1 application topically daily as needed (pressure spot).   Yes [provider]  oxyCODONE (OXY IR/ROXICODONE) 5 MG immediate release tablet Take 0.5-1 tablets (2.5-5 mg total) by mouth every 6 (six) hours as needed for severe pain. Patient not taking: Reported on 09/24/2020 03/21/20   Stark Klein, MD  ranitidine (ZANTAC) 150 MG tablet Take 1 tablet (150 mg total) by mouth 2 (two) times daily. Patient not taking: Reported on 01/20/2017 11/28/16 12/16/19  Palumbo, April, MD    Scheduled Meds: . amLODipine  5 mg Oral Daily  . aspirin EC  81 mg Oral QHS  . levothyroxine  88 mcg Oral Q0600  . lidocaine  1 patch Transdermal Q24H  . multivitamin  1 tablet Oral Daily  . polyethylene glycol  17 g Oral Daily  . propranolol  40 mg Oral QHS  . rosuvastatin  10 mg Oral QHS  . senna  2 tablet Oral Daily   Infusions: . cefTRIAXone (ROCEPHIN)  IV Stopped (09/27/20 1900)  . lactated ringers Stopped  (09/28/20 0145)   PRN Meds: acetaminophen **OR** acetaminophen, haloperidol lactate, hydrALAZINE, morphine injection, ondansetron **OR** ondansetron (ZOFRAN) IV, traMADol   Allergies as of 09/24/2020 - Review Complete 09/24/2020  Allergen Reaction Noted  . Wasp venom Anaphylaxis 01/28/2017  . Bee venom  01/20/2017  . Crestor [rosuvastatin]  03/16/2020  . Gemfibrozil Other (See Comments) 04/13/2008  . Indomethacin Other (See Comments) 03/12/2016  . Lipitor [atorvastatin]  03/16/2020  . Lisinopril Cough 08/04/2009  . Rofecoxib Other (See Comments) 03/12/2016  . Sildenafil Other (See Comments) 04/10/2015  . Simvastatin Other (See Comments) 04/13/2008    Family History  Problem Relation Age of Onset  . Bone cancer Mother     Social History   Socioeconomic History  . Marital status: Single    Spouse name: Not on file  . Number of children: Not on file  . Years of education: Not on file  . Highest education level: Not on file  Occupational History  . Occupation: Retired  Tobacco Use  . Smoking status: Former Smoker    Quit date: 12/12/1980    Years since quitting: 39.8  . Smokeless tobacco: Never Used  Vaping Use  . Vaping Use: Never used  Substance and Sexual Activity  . Alcohol use: No  . Drug use: No  . Sexual activity: Not on file  Other Topics Concern  . Not on file  Social History Narrative   Lives at home with wife.  1 son   Social Determinants of Radio broadcast assistant Strain: Not on file  Food Insecurity: Not on file  Transportation Needs: Not on file  Physical Activity: Not on file  Stress: Not on file  Social Connections: Not on file  Intimate Partner Violence: Not on file    REVIEW OF SYSTEMS: Constitutional: No weakness, no fatigue. Patient is generally very active, mows his own lawn.  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  Very active in his church.  Performs all his own ADLs. ENT:  No nose bleeds Pulm: No  shortness of breath.  No cough. CV:  No angina, no LE edema.  GU:  No hematuria, no frequency GI: See HPI. Heme: Denies unusual or excessive bleeding or bruising. Transfusions: None. Neuro:  No headaches, no peripheral tingling or numbness no syncope, no seizures. Derm:  No itching, no rash or sores.  Endocrine:  No sweats or chills.  No polyuria or dysuria Immunization: Vaccinated and boosted against COVID-19. Marland Kitchen    PHYSICAL EXAM: Vital signs in last 24 hours: Vitals:   09/28/20 0110 09/28/20 0606  BP: (!) 149/81 (!) 149/77  Pulse: 73 61  Resp: (!) 25 18  Temp:    SpO2: 98% 98%   Wt Readings from Last 3 Encounters:  09/24/20 65.8 kg  03/21/20 66.7 kg  03/16/20 66.7 kg    General: Patient is hard of hearing.  He looks well.  Sitting in the lounge chair comfortable. Head: No facial asymmetry or swelling.  No signs of head trauma. Eyes: No scleral icterus.  EOMI.  No conjunctival pallor. Ears: Hard of hearing but if you drop your mask and stay within a few feet of the patient he understands just fine. Nose: No congestion or discharge Mouth: Tongue midline.  Mucosa moist, pink, clear.  Dentures in place. Neck: No JVD, no masses, no thyromegaly Lungs: Clear.  Absent breath sounds on the right.  No labored breathing or cough. Heart: RRR.  Soft systolic somewhat harsh quality murmur.  S1, S2 present Abdomen: Soft without tenderness.  Not distended.  Active bowel sounds.  No HSM, masses, bruits, hernias.  Surgical incision sites benign, intact.   Rectal: Deferred Musc/Skeltl: No joint redness, swelling or gross deformity. Extremities: No CCE. Neurologic: Appropriate.  Not confused.  Hard of hearing.  Moves all 4 limbs without tremor, strength not tested Skin: No jaundice, no rash, no sores Nodes: No cervical adenopathy Psych: Calm, pleasant, cooperative.  Intake/Output from previous day: 05/04 0701 - 05/05 0700 In: 1854.9 [P.O.:240; I.V.:1514.9; IV Piggyback:100] Out: 1350  [Urine:1060; Drains:290] Intake/Output this shift: No intake/output data recorded.  LAB RESULTS: Recent Labs    09/26/20 0300 09/26/20 1440 09/28/20 0625  WBC 16.3* 13.2* 13.2*  HGB 14.4 13.3 12.4*  HCT 45.2 41.4 37.6*  PLT 169 154 210   BMET Lab Results  Component Value Date   NA 136 09/28/2020   NA 138 09/27/2020   NA 139 09/26/2020   K 4.3 09/28/2020   K 4.4 09/27/2020   K 4.0 09/26/2020   CL 103 09/28/2020   CL 106 09/27/2020   CL 104 09/26/2020   CO2 24 09/28/2020   CO2 26 09/27/2020   CO2 25 09/26/2020   GLUCOSE 98 09/28/2020   GLUCOSE 125 (H) 09/27/2020   GLUCOSE 99 09/26/2020   BUN 29 (H) 09/28/2020   BUN 25 (H) 09/27/2020   BUN 29 (H) 09/26/2020   CREATININE 1.39 (H) 09/28/2020   CREATININE 1.45 (H) 09/27/2020   CREATININE 1.68 (H) 09/26/2020   CALCIUM 9.4 09/28/2020   CALCIUM 9.0 09/27/2020   CALCIUM 9.8 09/26/2020   LFT Recent Labs    09/26/20 0300 09/27/20 0112 09/28/20 6578  PROT 6.0* 4.8* 5.3*  ALBUMIN 3.1* 2.3* 2.5*  AST 305* 84* 38  ALT 264* 130* 83*  ALKPHOS 131* 86 87  BILITOT 2.6* 0.3 0.8   PT/INR No results found for: INR, PROTIME Hepatitis Panel No results for input(s): HEPBSAG, HCVAB, HEPAIGM, HEPBIGM in the last 72 hours. C-Diff No components found for: CDIFF Lipase     Component Value Date/Time   LIPASE 42 09/24/2020 1932    Drugs of Abuse  No results found for: LABOPIA, COCAINSCRNUR, LABBENZ, AMPHETMU, THCU, LABBARB   RADIOLOGY STUDIES: No results found.    IMPRESSION:   *    Suspected postop bile leak following 5/3 subtotal cholecystectomy of necrotic gallbladder. Elevated LFTs improving.  *  PAF.  Not on blood thinner    PLAN:     *    Tentatively set up for ERCP with biliary stent placement tomorrow afternoon but want Dr. Bryan Lemma and Mansouraty to review the case.   Ordered bili level on JP drainage at request of Dr Jerilynn Mages.   I discussed the possibility of ERCP with the patient and his niece/[facility] [place] [phone].  They are agreeable to proceed if that is what GI pursues.     *   No need for him to be n.p.o. today.  Ordered regular diet which he tolerated this morning and n.p.o. after midnight.  Azucena Freed  09/28/2020, 10:24 AM Phone [phone]

## 2020-09-29 ENCOUNTER — Encounter (HOSPITAL_COMMUNITY): Payer: Self-pay | Admitting: Internal Medicine

## 2020-09-29 ENCOUNTER — Inpatient Hospital Stay (HOSPITAL_COMMUNITY): Payer: PPO

## 2020-09-29 ENCOUNTER — Inpatient Hospital Stay (HOSPITAL_COMMUNITY): Payer: PPO | Admitting: Certified Registered Nurse Anesthetist

## 2020-09-29 ENCOUNTER — Encounter (HOSPITAL_COMMUNITY)
Admission: EM | Disposition: A | Discharge status: Home Health | Payer: Self-pay | Source: Home / Self Care | Attending: Internal Medicine

## 2020-09-29 DIAGNOSIS — K838 Other specified diseases of biliary tract: Secondary | ICD-10-CM

## 2020-09-29 DIAGNOSIS — K3189 Other diseases of stomach and duodenum: Secondary | ICD-10-CM

## 2020-09-29 DIAGNOSIS — K2970 Gastritis, unspecified, without bleeding: Secondary | ICD-10-CM

## 2020-09-29 HISTORY — PX: REMOVAL OF STONES: SHX5545

## 2020-09-29 HISTORY — PX: PANCREATIC STENT PLACEMENT: SHX5539

## 2020-09-29 HISTORY — PX: ENDOSCOPIC RETROGRADE CHOLANGIOPANCREATOGRAPHY (ERCP) WITH PROPOFOL: SHX5810

## 2020-09-29 HISTORY — PX: BILIARY STENT PLACEMENT: SHX5538

## 2020-09-29 HISTORY — PX: BIOPSY: SHX5522

## 2020-09-29 HISTORY — PX: SPHINCTEROTOMY: SHX5544

## 2020-09-29 LAB — COMPREHENSIVE METABOLIC PANEL
ALT: 63 U/L — ABNORMAL HIGH (ref 0–44)
AST: 30 U/L (ref 15–41)
Albumin: 2.7 g/dL — ABNORMAL LOW (ref 3.5–5.0)
Alkaline Phosphatase: 92 U/L (ref 38–126)
Anion gap: 8 (ref 5–15)
BUN: 24 mg/dL — ABNORMAL HIGH (ref 8–23)
CO2: 23 mmol/L (ref 22–32)
Calcium: 9.6 mg/dL (ref 8.9–10.3)
Chloride: 104 mmol/L (ref 98–111)
Creatinine, Ser: 1.37 mg/dL — ABNORMAL HIGH (ref 0.61–1.24)
GFR, Estimated: 50 mL/min — ABNORMAL LOW (ref >60)
Glucose, Bld: 108 mg/dL — ABNORMAL HIGH (ref 70–99)
Potassium: 4.2 mmol/L (ref 3.5–5.1)
Sodium: 135 mmol/L (ref 135–145)
Total Bilirubin: 0.6 mg/dL (ref 0.3–1.2)
Total Protein: 5.8 g/dL — ABNORMAL LOW (ref 6.5–8.1)

## 2020-09-29 LAB — CBC
HCT: 42.7 % (ref 39.0–52.0)
Hemoglobin: 14.1 g/dL (ref 13.0–17.0)
MCH: 29.4 pg (ref 26.0–34.0)
MCHC: 33 g/dL (ref 30.0–36.0)
MCV: 89.1 fL (ref 80.0–100.0)
Platelets: 248 10*3/uL (ref 150–400)
RBC: 4.79 MIL/uL (ref 4.22–5.81)
RDW: 14 % (ref 11.5–15.5)
WBC: 13 10*3/uL — ABNORMAL HIGH (ref 4.0–10.5)
nRBC: 0 % (ref 0.0–0.2)

## 2020-09-29 SURGERY — ENDOSCOPIC RETROGRADE CHOLANGIOPANCREATOGRAPHY (ERCP) WITH PROPOFOL
Anesthesia: General

## 2020-09-29 MED ORDER — CIPROFLOXACIN IN D5W 400 MG/200ML IV SOLN
INTRAVENOUS | Status: DC | PRN
  Administered 2020-09-29: 400 mg via INTRAVENOUS

## 2020-09-29 MED ORDER — PHENYLEPHRINE 40 MCG/ML (10ML) SYRINGE FOR IV PUSH (FOR BLOOD PRESSURE SUPPORT)
PREFILLED_SYRINGE | INTRAVENOUS | Status: DC | PRN
  Administered 2020-09-29 (×2): 200 ug via INTRAVENOUS
  Administered 2020-09-29 (×2): 160 ug via INTRAVENOUS
  Administered 2020-09-29: 120 ug via INTRAVENOUS
  Administered 2020-09-29: 240 ug via INTRAVENOUS

## 2020-09-29 MED ORDER — PROPOFOL 10 MG/ML IV BOLUS
INTRAVENOUS | Status: DC | PRN
  Administered 2020-09-29: 90 mg via INTRAVENOUS

## 2020-09-29 MED ORDER — PHENYLEPHRINE HCL-NACL 10-0.9 MG/250ML-% IV SOLN
INTRAVENOUS | Status: DC | PRN
  Administered 2020-09-29: 40 ug/min via INTRAVENOUS

## 2020-09-29 MED ORDER — CIPROFLOXACIN IN D5W 400 MG/200ML IV SOLN
INTRAVENOUS | Status: AC
  Filled 2020-09-29: qty 200

## 2020-09-29 MED ORDER — LACTATED RINGERS IV SOLN: INTRAVENOUS | Status: DC

## 2020-09-29 MED ORDER — ONDANSETRON HCL 4 MG/2ML IJ SOLN
INTRAMUSCULAR | Status: DC | PRN
  Administered 2020-09-29: 4 mg via INTRAVENOUS

## 2020-09-29 MED ORDER — SODIUM CHLORIDE 0.9 % IV SOLN: INTRAVENOUS | Status: DC | PRN

## 2020-09-29 MED ORDER — PANTOPRAZOLE SODIUM 40 MG PO TBEC
40.0000 mg | DELAYED_RELEASE_TABLET | Freq: Every day | ORAL | Status: DC
  Administered 2020-09-29 – 2020-10-04 (×6): 40 mg via ORAL
  Filled 2020-09-29 (×6): qty 1

## 2020-09-29 MED ORDER — SUGAMMADEX SODIUM 200 MG/2ML IV SOLN
INTRAVENOUS | Status: DC | PRN
  Administered 2020-09-29: 200 mg via INTRAVENOUS

## 2020-09-29 MED ORDER — GLUCAGON HCL RDNA (DIAGNOSTIC) 1 MG IJ SOLR
INTRAMUSCULAR | Status: AC
  Filled 2020-09-29: qty 1

## 2020-09-29 MED ORDER — ROCURONIUM BROMIDE 10 MG/ML (PF) SYRINGE
PREFILLED_SYRINGE | INTRAVENOUS | Status: DC | PRN
  Administered 2020-09-29: 50 mg via INTRAVENOUS

## 2020-09-29 MED ORDER — GLUCAGON HCL RDNA (DIAGNOSTIC) 1 MG IJ SOLR
INTRAMUSCULAR | Status: DC | PRN
  Administered 2020-09-29 (×3): .25 mg via INTRAVENOUS

## 2020-09-29 MED ORDER — SODIUM CHLORIDE 0.9 % IV SOLN
INTRAVENOUS | Status: DC | PRN
  Administered 2020-09-29: 25 mL

## 2020-09-29 MED ORDER — DEXAMETHASONE SODIUM PHOSPHATE 10 MG/ML IJ SOLN
INTRAMUSCULAR | Status: DC | PRN
  Administered 2020-09-29: 5 mg via INTRAVENOUS

## 2020-09-29 MED ORDER — FENTANYL CITRATE (PF) 250 MCG/5ML IJ SOLN
INTRAMUSCULAR | Status: DC | PRN
  Administered 2020-09-29: 75 ug via INTRAVENOUS

## 2020-09-29 MED ORDER — INDOMETHACIN 50 MG RE SUPP
RECTAL | Status: AC
  Filled 2020-09-29: qty 2

## 2020-09-29 NOTE — Progress Notes (Signed)
Fluid from JP drain collected and delivered to lab by [place], NT. Per lab, test is a send out and cannot be done stat.

## 2020-09-29 NOTE — Progress Notes (Signed)
   09/29/20 1340  Clinical Encounter Type  Visited With Patient and family together  Visit Type Initial  Referral From Nurse  Consult/Referral To Chaplain  Spiritual Encounters  Spiritual Needs Literature (AD)  Chaplain received a call from 5W that Mr. Teddie Mehta niece in 4B44 wanted to talk about his HPOA.  Mr. Dock has a legal AD and Living Will prepared and notarized by his lawyer.  His niece Santiago Glad wanted to know if a copy of his HPOA could be placed in his file.  I explained a copy could be placed in his file, but you may have to fill out [facility] as well.  I explained to the niece I will speak with my supervisor and see if his legal HPOA will suffice or if they still must complete a [place] HPOA.    Mr. Ates was getting ready for his procedure this afternoon at 2pm and Santiago Glad informed me she will be here the rest of the day and next week depending on Mr. Ozment recovery time.   Chaplain Griffon Herberg [place]  [phone]

## 2020-09-29 NOTE — Progress Notes (Signed)
   Trauma/Critical Care Follow Up Note  Subjective:    Overnight Issues:   Objective:  Vital signs for last 24 hours: Temp:  [97.6 F (36.4 C)-98 F (36.7 C)] 97.6 F (36.4 C) (05/06 0503) Pulse Rate:  [59-90] 70 (05/06 0503) Resp:  [18-22] 20 (05/06 0503) BP: (125-175)/(60-80) 175/80 (05/06 0503) SpO2:  [96 %-100 %] 98 % (05/06 0503)  Hemodynamic parameters for last 24 hours:    Intake/Output from previous day: 05/05 0701 - 05/06 0700 In: 678 [P.O.:420; I.V.:258] Out: 660 [Urine:200; Drains:460]  Intake/Output this shift: No intake/output data recorded.  Vent settings for last 24 hours:    Physical Exam:  Gen: comfortable, no distress Neuro: non-focal exam HEENT: PERRL Neck: supple CV: RRR Pulm: unlabored breathing Abd: soft, NT, incisions c/d/i, JP bilious 460cc GU: clear yellow urine Extr: wwp, no edema   Results for orders placed or performed during the hospital encounter of 09/24/20 (from the past 24 hour(s))  CBC     Status: Abnormal   Collection Time: 09/29/20  1:27 AM  Result Value Ref Range   WBC 13.0 (H) 4.0 - 10.5 K/uL   RBC 4.79 4.22 - 5.81 MIL/uL   Hemoglobin 14.1 13.0 - 17.0 g/dL   HCT 42.7 39.0 - 52.0 %   MCV 89.1 80.0 - 100.0 fL   MCH 29.4 26.0 - 34.0 pg   MCHC 33.0 30.0 - 36.0 g/dL   RDW 14.0 11.5 - 15.5 %   Platelets 248 150 - 400 K/uL   nRBC 0.0 0.0 - 0.2 %  Comprehensive metabolic panel     Status: Abnormal   Collection Time: 09/29/20  1:27 AM  Result Value Ref Range   Sodium 135 135 - 145 mmol/L   Potassium 4.2 3.5 - 5.1 mmol/L   Chloride 104 98 - 111 mmol/L   CO2 23 22 - 32 mmol/L   Glucose, Bld 108 (H) 70 - 99 mg/dL   BUN 24 (H) 8 - 23 mg/dL   Creatinine, Ser 1.37 (H) 0.61 - 1.24 mg/dL   Calcium 9.6 8.9 - 10.3 mg/dL   Total Protein 5.8 (L) 6.5 - 8.1 g/dL   Albumin 2.7 (L) 3.5 - 5.0 g/dL   AST 30 15 - 41 U/L   ALT 63 (H) 0 - 44 U/L   Alkaline Phosphatase 92 38 - 126 U/L   Total Bilirubin 0.6 0.3 - 1.2 mg/dL   GFR,  Estimated 50 (L) >60 mL/min   Anion gap 8 5 - 15    Assessment & Plan:  Present on Admission: . Abdominal pain . Hypertension . New onset a-fib ([place]) . Acute cholecystitis    LOS: 2 days   Additional comments:I reviewed the patient's new clinical lab test results.   and I reviewed the patients new imaging test results.    Acute cholecystitis S/P laparoscopicsubtotal cholecystectomy 09/26/20 Dr. Bobbye Morton - POD#3 - drain frankly bilious this AM - 460 cc last 24h - GI to perform ERCP today - continue to mobilize   Jesusita Oka, MD Trauma & General Surgery Please use [email or web address] to contact on call provider  09/29/2020  *Care during the described time interval was provided by me. I have reviewed this patient's available data, including medical history, events of note, physical examination and test results as part of my evaluation.

## 2020-09-29 NOTE — Anesthesia Procedure Notes (Signed)
Procedure Name: Intubation Date/Time: 09/29/2020 4:11 PM Performed by: Dorthea Cove, CRNA Pre-anesthesia Checklist: Patient identified, Emergency Drugs available, Suction available and Patient being monitored Patient Re-evaluated:Patient Re-evaluated prior to induction Oxygen Delivery Method: Circle system utilized Preoxygenation: Pre-oxygenation with 100% oxygen Induction Type: IV induction Ventilation: Mask ventilation without difficulty Laryngoscope Size: Mac and 4 Grade View: Grade I Tube type: Oral Tube size: 7.5 mm Number of attempts: 1 Airway Equipment and Method: Stylet and Oral airway Placement Confirmation: ETT inserted through vocal cords under direct vision,  positive ETCO2 and breath sounds checked- equal and bilateral Secured at: 23 cm Tube secured with: Tape Dental Injury: Teeth and Oropharynx as per pre-operative assessment

## 2020-09-29 NOTE — Progress Notes (Addendum)
Progress Note  Patient Name: Juan Benjamin Date of Encounter: 09/29/2020  Primary Cardiologist: Minus Breeding, MD  Subjective   Denies CP or SOB. For ERCP today. Niece at bedside, reports patient has had some nocturnal confusion likely c/w sundowning. Currently has appropriate affect.  Inpatient Medications    Scheduled Meds: . amLODipine  10 mg Oral Daily  . aspirin EC  81 mg Oral QHS  . guaiFENesin  600 mg Oral BID  . levothyroxine  88 mcg Oral Q0600  . lidocaine  1 patch Transdermal Q24H  . multivitamin  1 tablet Oral Daily  . polyethylene glycol  17 g Oral Daily  . propranolol  40 mg Oral QHS  . rosuvastatin  10 mg Oral QHS  . senna  2 tablet Oral Daily   Continuous Infusions: . sodium chloride    . cefTRIAXone (ROCEPHIN)  IV Stopped (09/28/20 1900)  . lactated ringers 50 mL/hr at 09/29/20 0700   PRN Meds: sodium chloride, acetaminophen **OR** acetaminophen, haloperidol lactate, hydrALAZINE, morphine injection, ondansetron **OR** ondansetron (ZOFRAN) IV, traMADol   Vital Signs    Vitals:   09/28/20 1915 09/28/20 2010 09/28/20 2049 09/29/20 0503  BP:   (!) 158/60 (!) 175/80  Pulse: (!) 59  90 70  Resp: 19 (!) 22 20 20   Temp:   98 F (36.7 C) 97.6 F (36.4 C)  TempSrc:   Axillary Axillary  SpO2: 96%  100% 98%  Weight:      Height:        Intake/Output Summary (Last 24 hours) at 09/29/2020 1115 Last data filed at 09/29/2020 0700 Gross per 24 hour  Intake 877.94 ml  Output 585 ml  Net 292.94 ml   Last 3 Weights 09/24/2020 03/21/2020 03/16/2020  Weight (lbs) 145 lb 147 lb 0.8 oz 147 lb  Weight (kg) 65.772 kg 66.7 kg 66.679 kg     Telemetry    NSR/SB 50s-60s - Personally Reviewed  Physical Exam   GEN: No acute distress, elderly.  HEENT: Normocephalic, atraumatic, sclera non-icteric. Neck: No JVD or bruits. Cardiac: RRR 2/6 SEM RUSB, no rubs or gallops.  Respiratory: Clear to auscultation bilaterally. Breathing is unlabored. GI: Soft, nontender,  non-distended, BS +x 4. MS: no deformity. Extremities: No clubbing or cyanosis. No edema. Distal pedal pulses are 2+ and equal bilaterally. Neuro:  AAOx3. Follows commands. Psych:  Responds to questions appropriately with a normal affect.  Labs    High Sensitivity Troponin:   Recent Labs  Lab 09/24/20 1932 09/24/20 2125  TROPONINIHS 9 12      Cardiac EnzymesNo results for input(s): TROPONINI in the last 168 hours. No results for input(s): TROPIPOC in the last 168 hours.   Chemistry Recent Labs  Lab 09/27/20 0112 09/28/20 0625 09/29/20 0127  NA 138 136 135  K 4.4 4.3 4.2  CL 106 103 104  CO2 26 24 23   GLUCOSE 125* 98 108*  BUN 25* 29* 24*  CREATININE 1.45* 1.39* 1.37*  CALCIUM 9.0 9.4 9.6  PROT 4.8* 5.3* 5.8*  ALBUMIN 2.3* 2.5* 2.7*  AST 84* 38 30  ALT 130* 83* 63*  ALKPHOS 86 87 92  BILITOT 0.3 0.8 0.6  GFRNONAA 47* 49* 50*  ANIONGAP 6 9 8      Hematology Recent Labs  Lab 09/26/20 1440 09/28/20 0625 09/29/20 0127  WBC 13.2* 13.2* 13.0*  RBC 4.57 4.23 4.79  HGB 13.3 12.4* 14.1  HCT 41.4 37.6* 42.7  MCV 90.6 88.9 89.1  MCH 29.1 29.3 29.4  MCHC 32.1 33.0 33.0  RDW 14.5 14.1 14.0  PLT 154 210 248    BNPNo results for input(s): BNP, PROBNP in the last 168 hours.   DDimer No results for input(s): DDIMER in the last 168 hours.   Radiology    No results found.  Cardiac Studies   2D Echo 09/25/20 1. Left ventricular ejection fraction, by estimation, is 55%. The left  ventricle has normal function. The left ventricle has no regional wall  motion abnormalities. Left ventricular diastolic parameters are consistent  with Grade I diastolic dysfunction  (impaired relaxation).  2. Right ventricular systolic function is mildly reduced. The right  ventricular size is normal. Tricuspid regurgitation signal is inadequate  for assessing PA pressure.  3. Left atrial size was mildly dilated.  4. Right atrial size was mildly dilated.  5. The mitral valve is  normal in structure. Trivial mitral valve  regurgitation. No evidence of mitral stenosis.  6. The aortic valve is tricuspid. Aortic valve regurgitation is trivial.  Moderate aortic valve stenosis. Aortic valve mean gradient measures 29.0  mmHg, AVA 1.07 cm^2.  7. Aortic dilatation noted. There is mild dilatation of the aortic root,  measuring 38 mm.   Patient Profile     87 y.o. male with CAD s/p CABG 2000, carotid artery disease with known chronic left carotid artery occlusion, prior tobacco use, HTN, HLD, CKD stage IIIa by labs admitted with abdominal pain. 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 Seen by vascular surgery who recommended to explore other etiologies for pain. Found to have possible symptomatic cholelithasis, s/p laparascopic subtotal cholecystectomy 5/3. Cardiology consulted for new onset AFib.  Assessment & Plan    1. Acute cholecystitis s/p laparoscopicsubtotal cholecystectomy 09/26/20 - with JP drain placement, now with suspected persistent bile duct leak therefore plan for ERCP today  2. Paroxysmal atrial fib - noted earlier on in admission, spontaneously converted to NSR - also had brief afib on induction with anesthesia - hold off anticoagulation at this time given ongoing need for procedures - atrial fib may be temporally related to acute physiologic stress of illness but continue to follow on telemetry - will need to revisit plan for anticoagulation based on recovery and telemetry once all procedures complete - check TSH in AM - continue propranolol for now as HR allows (baseline HR currently 50s-60s)  3. CAD s/p CABG - on ASA, BB, statin at baseline, no angina reported  4. PAD - historically has had carotid disease followed by Dr. Percival Spanish, - now with AAA and iliac aneurysm to be followed by [facility] as outpatient - also has mildly dilated aortic root on echo to follow as outpatient where appropriate  5.  Moderate aortic stenosis - updated echo 09/25/20 with moderate AS - follow up as outpatient  6. CKD stage IIIa - Cr appears near baseline  7. Essential HTN - BP remains labile, will defer to primary team given intermittent surgical procedures - if needed, could consider changing propranolol to carvedilol  8. Sundowning - per IM  Anticipate cardiology to follow peripherally over the weekend with telemetry checks.  Niece inquiring about who handles advanced directive paperwork, have relayed request to nurse via care order (niece states she also spoke with nurse as well).  For questions or updates, please contact [facility] [place] Please consult [email or web address] for contact info under Cardiology/STEMI.  Signed, Charlie Pitter, PA-C 09/29/2020, 11:15 AM    Patient seen and  examined with Melina Copa PA-C.  Agree as above, with the following exceptions and changes as noted below. For ERCP today. Gen: NAD, CV: RRR, 2/6 SEM, Lungs: clear, Abd: soft, Extrem: Warm, well perfused, no edema, Neuro/Psych: alert and oriented x 3, normal mood and affect. All available labs, radiology testing, previous records reviewed. D/w Dr. Bobbye Morton yesterday, recommend holding anticoagulation until safe from post procedural, post operative standpoint.   Elouise Munroe, MD 09/29/20 4:07 PM

## 2020-09-29 NOTE — Op Note (Signed)
[facility] Patient Name: Juan Benjamin Procedure Date : 09/29/2020 MRN: [id number] Attending MD: [name] , MD Date of Birth: 1930-07-13 CSN: [id number] Age: 87 Admit Type: Inpatient Procedure:                ERCP Indications:              Bile leak, Elevated liver enzymes, Postop exam:                            Cholecystectomy Providers:                [name], MD, Jeanella Cara, RN,                            Ladona Ridgel, Technician Referring MD:             Gerrit Heck, MD, [facility], Jesusita Oka Medicines:                General Anesthesia, Cipro 400 mg IV, Glucagon 0.75                            mg IV Complications:            No immediate complications. Estimated Blood Loss:     Estimated blood loss was minimal. Procedure:                Pre-Anesthesia Assessment:                           - Prior to the procedure, a History and Physical                            was performed, and patient medications and                            allergies were reviewed. The patient's tolerance of                            previous anesthesia was also reviewed. The risks                            and benefits of the procedure and the sedation                            options and risks were discussed with the patient.                            All questions were answered, and informed consent                            was obtained. Prior Anticoagulants: The patient has                            taken no previous  anticoagulant or antiplatelet                            agents except for aspirin. ASA Grade Assessment:                            III - A patient with severe systemic disease. After                            reviewing the risks and benefits, the patient was                            deemed in satisfactory condition to undergo the                            procedure.                            After obtaining informed consent, the scope was                            passed under direct vision. Throughout the                            procedure, the patient's blood pressure, pulse, and                            oxygen saturations were monitored continuously. The                            TJF- Q180V ([id number]) Olympus duodenoscope was                            introduced through the mouth, and used to inject                            contrast into and used to inject contrast into the                            bile duct and ventral pancreatic duct. The ERCP was                            accomplished without difficulty. The patient                            tolerated the procedure. Scope In: Scope Out: Findings:      A scout film of the abdomen was obtained. One percutaneous drain ending       in the Right upper quadrant was seen.      The upper GI tract was traversed under direct vision without detailed       examination. A hiatal hernia was noted. A J-shaped deformity was found       in the stomach. Scattered moderate inflammation characterized by       erosions, erythema and friability was found  in the cardia and in the       gastric body. No other gross lesions were noted in the entire examined       stomach. Biopsies obtained randomly from the stomach for HP evaluation.       No gross lesions were noted in the duodenal bulb, in the first portion       of the duodenum and in the second portion of the duodenum. The major       papilla was normal.      On the first attempt, the wire was placed into the pancreatic       orifice/pancreatic duct. Due to inability to administer Indomethacin due       to confirmed allergy, I decided to leave the wire in place. Decision was       made to pursue a double-wire approach.      A short 0.035 inch Soft Jagwire was then able to easily pass into the       biliary tree. The Hydratome sphincterotome was passed over the  guidewire       and the bile duct was then deeply cannulated. Contrast was injected. I       personally interpreted the bile duct and pancreatic duct images. Ductal       flow of contrast was adequate. Image quality was adequate. Contrast       extended to the hepatic ducts. Opacification of the entire biliary tree       except for the gallbladder was successful. The lower third of the main       bile duct and middle third of the main bile duct were moderately       dilated. The largest diameter was 10 mm. The lower third of the main       duct contained some filling defects thought to be sludge. Extravasation       of contrast originating from the cystic duct was observed. A 7 mm       biliary sphincterotomy was made with a monofilament Hydratome       sphincterotome using ERBE electrocautery. There was no       post-sphincterotomy bleeding. To discover objects, the biliary tree was       swept with a retrieval balloon starting at the bifurcation. Gritty       sludge was swept from the duct. An occlusion cholangiogram was performed       that showed Extravasation of contrast originating from the cystic duct       with the cystic duct takeoff within 5-6 cm from the papillary orifice.      A pancreatogram was not performed. One 4 Fr by 7 cm temporary plastic       pancreatic stent with a single external pigtail was placed into the       ventral pancreatic duct. The stent was in good position. This was placed       to decrease risk of Post-ERCP Pancreatitis.      One 10 Fr by 7 cm plastic biliary stent with a single external flap and       a single internal flap was placed into the common bile duct. Bile flowed       through the stent. The stent was in good position.      The duodenoscope was withdrawn from the patient. Impression:               - Hiatal hernia.                           -  J-shaped deformity. Gastritis. Biopsied.                           - No gross lesions in the duodenal  bulb, in the                            first portion of the duodenum and in the second                            portion of the duodenum.                           - The major papilla appeared normal.                           - Double wire technique as described above was                            performed for biliary cannulation.                           - The fluoroscopic examination was suspicious for                            sludge with the middle third of the main bile duct                            and lower third of the main bile duct being                            moderately dilated.                           - A bile leak was found from the cystic duct region.                           - A biliary sphincterotomy was performed.                           - The biliary tree was swept and sludge was found.                           - One temporary plastic pancreatic stent was placed                            into the ventral pancreatic duct to decrease PEP.                           - One plastic biliary stent was placed into the                            common bile duct to aid in healing of the bile duct  leak. Recommendation:           - The patient will be observed post-procedure,                            until all discharge criteria are met.                           - Return patient to hospital ward for ongoing care.                           - Advance diet as tolerated.                           - Check liver enzymes (AST, ALT, alkaline                            phosphatase, bilirubin) in the morning.                           - Observe patient's clinical course.                           - Await path results.                           - Start Omeprazole or Protonix 40 mg daily.                           - Patient will need a KUB 2-view in 10-14 days to                            ensure pancreatic stent has migrated successfully.                             If still present at that time will need to be                            scheduled for EGD with stent pull.                           - Repeat ERCP in 6 weeks to check healing.                           - Watch for pancreatitis, bleeding, perforation,                            and cholangitis.                           - The findings and recommendations were discussed                            with the patient.                           -  The findings and recommendations were discussed                            with the patient's family.                           - The findings and recommendations were discussed                            with the Hospital Team. Procedure Code(s):        --- Professional ---                           [phone], Endoscopic retrograde                            cholangiopancreatography (ERCP); with placement of                            endoscopic stent into biliary or pancreatic duct,                            including pre- and post-dilation and guide wire                            passage, when performed, including sphincterotomy,                            when performed, each stent                           43274, 44, Endoscopic retrograde                            cholangiopancreatography (ERCP); with placement of                            endoscopic stent into biliary or pancreatic duct,                            including pre- and post-dilation and guide wire                            passage, when performed, including sphincterotomy,                            when performed, each stent                           43264, Endoscopic retrograde                            cholangiopancreatography (ERCP); with removal of                            calculi/debris from biliary/pancreatic duct(s) Diagnosis Code(s):        ---  Professional ---                           K31.89, Other diseases of stomach and duodenum                            K29.70, Gastritis, unspecified, without bleeding                           K83.8, Other specified diseases of biliary tract                           K83.9, Disease of biliary tract, unspecified                           R74.8, Abnormal levels of other serum enzymes                           Z09, Encounter for follow-up examination after                            completed treatment for conditions other than                            malignant neoplasm                           Z90.49, Acquired absence of other specified parts                            of digestive tract CPT copyright 2019 [employer]. All rights reserved. The codes documented in this report are preliminary and upon coder review may  be revised to meet current compliance requirements. [name], MD 09/29/2020 5:37:16 PM Number of Addenda: 0

## 2020-09-29 NOTE — Progress Notes (Signed)
PROGRESS NOTE    Juan Benjamin  LNL:[id number] DOB: 27-May-1931 DOA: 09/24/2020 PCP: Aletha Halim., PA-C    Chief Complaint  Patient presents with  . Abdominal Pain    Brief admission Narrative:   Juan Benjamin is a 87 y.o. male with medical history significant of CAD, carotid artery dz s/p CEA, HTN.  Pt presents to the ED with c/o abd pain.  Pain onset 3 hours PTA.  This onset was 2h after eating a peanut butter and banana sandwich.  Pain unchanged since onset.  No movement.  Pain is in RUQ.  Pain is constant, moderate.  No N/V/D.  No urinary symptoms, no constipation, no CP. Work-up was significant for acute cholecystitis, as well as finding of new diagnosis of atrial fibrillation. Patient went for laparoscopic cholecystectomy 5/3, total cholecystectomy of necrotic bladder, patient with continued significant biliary leak through JP drain, GI consulted, plan for ERCP 5/6  Subjective:  No significant events overnight, he does complain of some mild right upper quadrant pain, otherwise no nausea or vomiting.    Assessment & Plan:  1-acute cholecystitis - S/p laparoscopic subtotal cholecystectomy on 09/26/2020 by Dr. Bobbye Morton -Continue Rocephin -No febrile at this moment and denies nausea or vomiting -WBCs trending down -LFTs downtrending -Tolerating soft diet -Continue as needed antiemetics and analgesics. -We will try to mobilize, PT/OT consulted, encouraged with incentive spirometer. -Patient with significant drainage chronically bilious, GI consulted plan for ERCP today.    2-Hypertension -Overall stable and well-controlled -Continue current antihypertensive agents and follow vital signs.  3-New onset a-fib (HCC) -Appears to be paroxysmal in nature -Currently rate controlled -Following cardiology recommendations patient will need DOAC once clear by surgery to provide anticoagulation.   -full dose Lovenox times been held this morning by general surgery for ERCP  . -Continue telemetry monitoring  4-hyperlipidemia -Continue Crestor  5-hypothyroidism -Continue Synthroid  6- Transaminitis -Due to cholecystitis, trending down.  7. AAA -This is incidental finding on imaging good, vascular surgery input greatly appreciated, recommendation for outpatient follow-up for possible need for surgical repair  DVT prophylaxis: lovenox Code Status: Full code Family Communication: Niece at bedside. Disposition:   Status is: INPATIENT  Dispo: The patient is from: Home              Anticipated d/c is to: Home              Patient currently not medically stable for discharge; status postcholecystectomy, demonstrating elevated LFTs and waiting for intestine to work-up.  Slowly advancing diet and will follow general surgery postoperative recommendations.   Difficult to place patient no       Consultants:   General surgery  Cardiology  Vascular surgery  GI   Procedures:  Status post laparoscopic cholecystectomy 09/26/2020   Antimicrobials:  Rocephin      Objective: Vitals:   09/28/20 1915 09/28/20 2010 09/28/20 2049 09/29/20 0503  BP:   (!) 158/60 (!) 175/80  Pulse: (!) 59  90 70  Resp: 19 (!) 22 20 20   Temp:   98 F (36.7 C) 97.6 F (36.4 C)  TempSrc:   Axillary Axillary  SpO2: 96%  100% 98%  Weight:      Height:        Intake/Output Summary (Last 24 hours) at 09/29/2020 1406 Last data filed at 09/29/2020 0700 Gross per 24 hour  Intake 637.94 ml  Output 585 ml  Net 52.94 ml   Filed Weights   09/24/20 1841  Weight: 65.8 kg  Examination:  Patient is awake today, in no apparent distress, frail  Symmetrical Chest wall movement, Good air movement bilaterally, CTAB RRR,No Gallops,Rubs or new Murmurs, No Parasternal Heave +ve B.Sounds, Abd Soft, No tenderness, right upper quadrant drain +. No Cyanosis, Clubbing or edema, No new Rash or bruise      Data Reviewed: I have personally reviewed following labs and imaging  studies  CBC: Recent Labs  Lab 09/24/20 1932 09/25/20 0500 09/26/20 0300 09/26/20 1440 09/28/20 0625 09/29/20 0127  WBC 11.4* 23.4* 16.3* 13.2* 13.2* 13.0*  NEUTROABS 8.2*  --   --   --   --   --   HGB 15.0 14.7 14.4 13.3 12.4* 14.1  HCT 45.4 46.5 45.2 41.4 37.6* 42.7  MCV 90.3 91.9 91.9 90.6 88.9 89.1  PLT 197 184 169 154 210 073    Basic Metabolic Panel: Recent Labs  Lab 09/25/20 0500 09/26/20 0300 09/27/20 0112 09/28/20 0625 09/29/20 0127  NA 140 139 138 136 135  K 4.5 4.0 4.4 4.3 4.2  CL 110 104 106 103 104  CO2 20* 25 26 24 23   GLUCOSE 167* 99 125* 98 108*  BUN 26* 29* 25* 29* 24*  CREATININE 1.48* 1.68* 1.45* 1.39* 1.37*  CALCIUM 10.3 9.8 9.0 9.4 9.6  MG  --  1.7  --   --   --   PHOS  --  3.0  --   --   --     GFR: Estimated Creatinine Clearance: 35.4 mL/min (A) (by C-G formula based on SCr of 1.37 mg/dL (H)).  Liver Function Tests: Recent Labs  Lab 09/25/20 0500 09/26/20 0300 09/27/20 0112 09/28/20 0625 09/29/20 0127  AST 28 305* 84* 38 30  ALT 18 264* 130* 83* 63*  ALKPHOS 74 131* 86 87 92  BILITOT 1.0 2.6* 0.3 0.8 0.6  PROT 6.2* 6.0* 4.8* 5.3* 5.8*  ALBUMIN 3.4* 3.1* 2.3* 2.5* 2.7*    CBG: No results for input(s): GLUCAP in the last 168 hours.   Recent Results (from the past 240 hour(s))  Resp Panel by RT-PCR (Flu A&B, Covid) Nasopharyngeal Swab     Status: None   Collection Time: 09/24/20  9:25 PM   Specimen: Nasopharyngeal Swab; Nasopharyngeal(NP) swabs in vial transport medium  Result Value Ref Range Status   SARS Coronavirus 2 by RT PCR NEGATIVE NEGATIVE Final    Comment: (NOTE) SARS-CoV-2 target nucleic acids are NOT DETECTED.  The SARS-CoV-2 RNA is generally detectable in upper respiratory specimens during the acute phase of infection. The lowest concentration of SARS-CoV-2 viral copies this assay can detect is 138 copies/mL. A negative result does not preclude SARS-Cov-2 infection and should not be used as the sole basis for  treatment or other patient management decisions. A negative result may occur with  improper specimen collection/handling, submission of specimen other than nasopharyngeal swab, presence of viral mutation(s) within the areas targeted by this assay, and inadequate number of viral copies(<138 copies/mL). A negative result must be combined with clinical observations, patient history, and epidemiological information. The expected result is Negative.  Fact Sheet for Patients:  EntrepreneurPulse.com.au  Fact Sheet for Healthcare Providers:  IncredibleEmployment.be  This test is no t yet approved or cleared by the [place] FDA and  has been authorized for detection and/or diagnosis of SARS-CoV-2 by FDA under an Emergency Use Authorization (EUA). This EUA will remain  in effect (meaning this test can be used) for the duration of the COVID-19 declaration under Section 564(b)(1) of the  Act, 21 U.S.C.section 360bbb-3(b)(1), unless the authorization is terminated  or revoked sooner.       Influenza A by PCR NEGATIVE NEGATIVE Final   Influenza B by PCR NEGATIVE NEGATIVE Final    Comment: (NOTE) The Xpert Xpress SARS-CoV-2/FLU/RSV plus assay is intended as an aid in the diagnosis of influenza from Nasopharyngeal swab specimens and should not be used as a sole basis for treatment. Nasal washings and aspirates are unacceptable for Xpert Xpress SARS-CoV-2/FLU/RSV testing.  Fact Sheet for Patients: EntrepreneurPulse.com.au  Fact Sheet for Healthcare Providers: IncredibleEmployment.be  This test is not yet approved or cleared by the [place] FDA and has been authorized for detection and/or diagnosis of SARS-CoV-2 by FDA under an Emergency Use Authorization (EUA). This EUA will remain in effect (meaning this test can be used) for the duration of the COVID-19 declaration under Section 564(b)(1) of the Act, 21  U.S.C. section 360bbb-3(b)(1), unless the authorization is terminated or revoked.  Performed at [facility], [address] [place], [place] [address]   Surgical PCR screen     Status: None   Collection Time: 09/25/20 11:30 PM   Specimen: Nasal Mucosa; Nasal Swab  Result Value Ref Range Status   MRSA, PCR NEGATIVE NEGATIVE Final   Staphylococcus aureus NEGATIVE NEGATIVE Final    Comment: (NOTE) The Xpert SA Assay (FDA approved for NASAL specimens in patients 109 years of age and older), is one component of a comprehensive surveillance program. It is not intended to diagnose infection nor to guide or monitor treatment. Performed at [facility], [place] [address], [place], [place] [address]      Radiology Studies: No results found.  Scheduled Meds: . amLODipine  10 mg Oral Daily  . aspirin EC  81 mg Oral QHS  . guaiFENesin  600 mg Oral BID  . levothyroxine  88 mcg Oral Q0600  . lidocaine  1 patch Transdermal Q24H  . multivitamin  1 tablet Oral Daily  . polyethylene glycol  17 g Oral Daily  . propranolol  40 mg Oral QHS  . rosuvastatin  10 mg Oral QHS  . senna  2 tablet Oral Daily   Continuous Infusions: . sodium chloride    . cefTRIAXone (ROCEPHIN)  IV Stopped (09/28/20 1900)  . lactated ringers 50 mL/hr at 09/29/20 0700     LOS: 2 days       Phillips Climes, MD [facility]   To contact the attending provider between 7A-7P or the covering provider during after hours 7P-7A, please log into the web site [email or web address] and access using universal [place] password for that web site. If you do not have the password, please call the hospital operator.  09/29/2020, 2:06 PM

## 2020-09-29 NOTE — Transfer of Care (Signed)
Immediate Anesthesia Transfer of Care Note  Patient: Will Heinkel [facility]  Procedure(s) Performed: ENDOSCOPIC RETROGRADE CHOLANGIOPANCREATOGRAPHY (ERCP) WITH PROPOFOL (N/A ) BIOPSY REMOVAL OF STONES PANCREATIC STENT PLACEMENT BILIARY STENT PLACEMENT SPHINCTEROTOMY  Patient Location: Endoscopy Unit  Anesthesia Type:General  Level of Consciousness: awake, alert  and oriented  Airway & Oxygen Therapy: Patient Spontanous Breathing  Post-op Assessment: Report given to RN and Post -op Vital signs reviewed and stable  Post vital signs: Reviewed and stable  Last Vitals:  Vitals Value Taken Time  BP 139/56 09/29/20 1725  Temp    Pulse 65 09/29/20 1726  Resp 21 09/29/20 1726  SpO2 99 % 09/29/20 1726  Vitals shown include unvalidated device data.  Last Pain:  Vitals:   09/29/20 1527  TempSrc: Temporal  PainSc: 0-No pain      Patients Stated Pain Goal: 5 (93/71/69 6789)  Complications: No complications documented.

## 2020-09-29 NOTE — Interval H&P Note (Signed)
History and Physical Interval Note:  09/29/2020 3:29 PM  Juan Benjamin  has presented today for surgery, with the diagnosis of Postoperative bile leak.  The various methods of treatment have been discussed with the patient and family. After consideration of risks, benefits and other options for treatment, the patient has consented to  Procedure(s): ENDOSCOPIC RETROGRADE CHOLANGIOPANCREATOGRAPHY (ERCP) WITH PROPOFOL (N/A) as a surgical intervention.  The patient's history has been reviewed, patient examined, no change in status, stable for surgery.  I have reviewed the patient's chart and labs.  Questions were answered to the patient's satisfaction.    The risks of an ERCP were discussed at length, including but not limited to the risk of perforation, bleeding, abdominal pain, post-ERCP pancreatitis (while usually mild can be severe and even life threatening).    [employer]

## 2020-09-29 NOTE — Anesthesia Preprocedure Evaluation (Addendum)
Anesthesia Evaluation  Patient identified by MRN, date of birth, ID band Patient awake    Reviewed: Allergy & Precautions, NPO status , Patient's Chart, lab work & pertinent test results  Airway Mallampati: II  TM Distance: >3 FB     Dental  (+) Edentulous Upper, Edentulous Lower   Pulmonary former smoker,    breath sounds clear to auscultation       Cardiovascular hypertension,  Rhythm:Regular Rate:Normal + Systolic murmurs    Neuro/Psych    GI/Hepatic   Endo/Other    Renal/GU      Musculoskeletal   Abdominal   Peds  Hematology   Anesthesia Other Findings   Reproductive/Obstetrics                             Anesthesia Physical Anesthesia Plan  ASA: III  Anesthesia Plan: General   Post-op Pain Management:    Induction: Intravenous  PONV Risk Score and Plan: Ondansetron and Dexamethasone  Airway Management Planned: Oral ETT  Additional Equipment:   Intra-op Plan:   Post-operative Plan: Extubation in OR  Informed Consent: I have reviewed the patients History and Physical, chart, labs and discussed the procedure including the risks, benefits and alternatives for the proposed anesthesia with the patient or authorized representative who has indicated his/her understanding and acceptance.       Plan Discussed with: CRNA and Anesthesiologist  Anesthesia Plan Comments:         Anesthesia Quick Evaluation

## 2020-09-30 DIAGNOSIS — K279 Peptic ulcer, site unspecified, unspecified as acute or chronic, without hemorrhage or perforation: Secondary | ICD-10-CM

## 2020-09-30 DIAGNOSIS — Z9889 Other specified postprocedural states: Secondary | ICD-10-CM

## 2020-09-30 LAB — COMPREHENSIVE METABOLIC PANEL
ALT: 47 U/L — ABNORMAL HIGH (ref 0–44)
AST: 19 U/L (ref 15–41)
Albumin: 2.6 g/dL — ABNORMAL LOW (ref 3.5–5.0)
Alkaline Phosphatase: 82 U/L (ref 38–126)
Anion gap: 11 (ref 5–15)
BUN: 25 mg/dL — ABNORMAL HIGH (ref 8–23)
CO2: 22 mmol/L (ref 22–32)
Calcium: 9.5 mg/dL (ref 8.9–10.3)
Chloride: 101 mmol/L (ref 98–111)
Creatinine, Ser: 1.5 mg/dL — ABNORMAL HIGH (ref 0.61–1.24)
GFR, Estimated: 45 mL/min — ABNORMAL LOW (ref >60)
Glucose, Bld: 118 mg/dL — ABNORMAL HIGH (ref 70–99)
Potassium: 4.8 mmol/L (ref 3.5–5.1)
Sodium: 134 mmol/L — ABNORMAL LOW (ref 135–145)
Total Bilirubin: 1 mg/dL (ref 0.3–1.2)
Total Protein: 5.5 g/dL — ABNORMAL LOW (ref 6.5–8.1)

## 2020-09-30 LAB — CBC
HCT: 43.2 % (ref 39.0–52.0)
Hemoglobin: 14.4 g/dL (ref 13.0–17.0)
MCH: 29.1 pg (ref 26.0–34.0)
MCHC: 33.3 g/dL (ref 30.0–36.0)
MCV: 87.4 fL (ref 80.0–100.0)
Platelets: 208 10*3/uL (ref 150–400)
RBC: 4.94 MIL/uL (ref 4.22–5.81)
RDW: 13.6 % (ref 11.5–15.5)
WBC: 8.7 10*3/uL (ref 4.0–10.5)
nRBC: 0 % (ref 0.0–0.2)

## 2020-09-30 LAB — TSH: TSH: 2.6 u[IU]/mL (ref 0.350–4.500)

## 2020-09-30 MED ORDER — ENOXAPARIN SODIUM 40 MG/0.4ML IJ SOSY
40.0000 mg | PREFILLED_SYRINGE | INTRAMUSCULAR | Status: DC
  Administered 2020-09-30: 40 mg via SUBCUTANEOUS
  Filled 2020-09-30: qty 0.4

## 2020-09-30 MED ORDER — MAGNESIUM CITRATE PO SOLN
0.5000 | Freq: Once | ORAL | Status: AC
  Administered 2020-09-30: 0.5 via ORAL
  Filled 2020-09-30: qty 296

## 2020-09-30 MED ORDER — BISACODYL 5 MG PO TBEC
10.0000 mg | DELAYED_RELEASE_TABLET | Freq: Once | ORAL | Status: AC
  Administered 2020-09-30: 10 mg via ORAL
  Filled 2020-09-30: qty 2

## 2020-09-30 MED ORDER — MAGNESIUM CITRATE PO SOLN: 0.5000 | Freq: Once | ORAL | Status: DC

## 2020-09-30 NOTE — Anesthesia Postprocedure Evaluation (Signed)
Anesthesia Post Note  Patient: NICASIO BARLOWE  Procedure(s) Performed: ENDOSCOPIC RETROGRADE CHOLANGIOPANCREATOGRAPHY (ERCP) WITH PROPOFOL (N/A ) BIOPSY REMOVAL OF STONES PANCREATIC STENT PLACEMENT BILIARY STENT PLACEMENT SPHINCTEROTOMY     Patient location during evaluation: PACU Anesthesia Type: General Level of consciousness: awake and alert Pain management: pain level controlled Vital Signs Assessment: post-procedure vital signs reviewed and stable Respiratory status: spontaneous breathing, nonlabored ventilation and respiratory function stable Cardiovascular status: blood pressure returned to baseline and stable Postop Assessment: no apparent nausea or vomiting Anesthetic complications: no   No complications documented.  Last Vitals:  Vitals:   09/30/20 0334 09/30/20 0335  BP:    Pulse: (!) 55 (!) 54  Resp:    Temp:    SpO2: 97% 97%    Last Pain:  Vitals:   09/30/20 0331  TempSrc: Axillary  PainSc:                  Lynda Rainwater

## 2020-09-30 NOTE — Progress Notes (Addendum)
PROGRESS NOTE    Juan Benjamin  QIO:[id number] DOB: [birth year] DOA: 09/24/2020 PCP: Aletha Halim., PA-C    Chief Complaint  Patient presents with  . Abdominal Pain    Brief admission Narrative:   Juan Benjamin is a 87 y.o. male with medical history significant of CAD, carotid artery dz s/p CEA, HTN.  Pt presents to the ED with c/o abd pain.  Pain onset 3 hours PTA.  This onset was 2h after eating a peanut butter and banana sandwich.  Pain unchanged since onset.  No movement.  Pain is in RUQ.  Pain is constant, moderate.  No N/V/D.  No urinary symptoms, no constipation, no CP. Work-up was significant for acute cholecystitis, as well as finding of new diagnosis of atrial fibrillation. Patient went for laparoscopic cholecystectomy 5/3, total cholecystectomy of necrotic bladder, patient with continued significant biliary leak through JP drain, GI consulted, plan for ERCP 5/6  Subjective:  No significant events overnight, does report some pain and right upper quadrant pain, no nausea, no vomiting, complains of constipation. Assessment & Plan:  acute cholecystitis - S/p laparoscopic subtotal cholecystectomy on 09/26/2020 by Dr. Bobbye Morton -Continue Rocephin -No febrile at this moment and denies nausea or vomiting -WBCs trending down -LFTs downtrending -Tolerating soft diet -Continue as needed antiemetics and analgesics. -We will try to mobilize, PT/OT consulted, encouraged with incentive spirometer. -Patient with significant bilious drainage through JP drain, concern for.  Leak, GI input appreciated, s/p ERCP 5/6, status post temporary plastic pancreatic stent, and sphincterectomy and biliary tree was swept and sludge was found . -Will give magnesium citrate for constipation.  2-Hypertension -Overall stable and well-controlled -Continue current antihypertensive agents and follow vital signs.  3-New onset a-fib (HCC) -Appears to be paroxysmal in nature -Currently rate  controlled -Following cardiology recommendations patient will need DOAC once clear by surgery to provide anticoagulation.   -Further decision about full anticoagulation pending clinical progression, possible need for further procedures, for now we will continue with DVT prophylaxis dose. -Continue telemetry monitoring  4-hyperlipidemia -Continue Crestor  5-hypothyroidism -Continue Synthroid  6- Transaminitis -Due to cholecystitis, trending down.  7. AAA -This is incidental finding on imaging good, vascular surgery input greatly appreciated, recommendation for outpatient follow-up for possible need for surgical repair  DVT prophylaxis: lovenox Code Status: Full code Family Communication: Niece at bedside. Disposition:   Status is: INPATIENT  Dispo: The patient is from: Home              Anticipated d/c is to: Home              Patient currently not medically stable for discharge; status postcholecystectomy, demonstrating elevated LFTs and waiting for intestine to work-up.  Slowly advancing diet and will follow general surgery postoperative recommendations.   Difficult to place patient no       Consultants:   General surgery  Cardiology  Vascular surgery  GI   Procedures:  Status post laparoscopic cholecystectomy 09/26/2020   Antimicrobials:  Rocephin      Objective: Vitals:   09/30/20 0333 09/30/20 0334 09/30/20 0335 09/30/20 1429  BP:      Pulse: (!) 59 (!) 55 (!) 54   Resp:    17  Temp:    98 F (36.7 C)  TempSrc:    Oral  SpO2: 98% 97% 97%   Weight:      Height:        Intake/Output Summary (Last 24 hours) at 09/30/2020 1715 Last data filed at  09/30/2020 0900 Gross per 24 hour  Intake 1048.25 ml  Output 600 ml  Net 448.25 ml   Filed Weights   09/24/20 1841 09/29/20 1527  Weight: 65.8 kg 65.8 kg    Examination:  Awake Alert, Oriented X 3, No new F.N deficits, Normal affect Symmetrical Chest wall movement, Good air movement bilaterally,  CTAB RRR,No Gallops,Rubs or new Murmurs, No Parasternal Heave +ve B.Sounds, Abd Soft, some right upper quadrant tenderness, right upper quadrant drain with minimal leakage at insertion site No Cyanosis, Clubbing or edema, No new Rash or bruise      Data Reviewed: I have personally reviewed following labs and imaging studies  CBC: Recent Labs  Lab 09/24/20 1932 09/25/20 0500 09/26/20 0300 09/26/20 1440 09/28/20 0625 09/29/20 0127 09/30/20 0041  WBC 11.4*   < > 16.3* 13.2* 13.2* 13.0* 8.7  NEUTROABS 8.2*  --   --   --   --   --   --   HGB 15.0   < > 14.4 13.3 12.4* 14.1 14.4  HCT 45.4   < > 45.2 41.4 37.6* 42.7 43.2  MCV 90.3   < > 91.9 90.6 88.9 89.1 87.4  PLT 197   < > 169 154 210 248 208   < > = values in this interval not displayed.    Basic Metabolic Panel: Recent Labs  Lab 09/26/20 0300 09/27/20 0112 09/28/20 0625 09/29/20 0127 09/30/20 0041  NA 139 138 136 135 134*  K 4.0 4.4 4.3 4.2 4.8  CL 104 106 103 104 101  CO2 25 26 24 23 22   GLUCOSE 99 125* 98 108* 118*  BUN 29* 25* 29* 24* 25*  CREATININE 1.68* 1.45* 1.39* 1.37* 1.50*  CALCIUM 9.8 9.0 9.4 9.6 9.5  MG 1.7  --   --   --   --   PHOS 3.0  --   --   --   --     GFR: Estimated Creatinine Clearance: 32.3 mL/min (A) (by C-G formula based on SCr of 1.5 mg/dL (H)).  Liver Function Tests: Recent Labs  Lab 09/26/20 0300 09/27/20 0112 09/28/20 0625 09/29/20 0127 09/30/20 0041  AST 305* 84* 38 30 19  ALT 264* 130* 83* 63* 47*  ALKPHOS 131* 86 87 92 82  BILITOT 2.6* 0.3 0.8 0.6 1.0  PROT 6.0* 4.8* 5.3* 5.8* 5.5*  ALBUMIN 3.1* 2.3* 2.5* 2.7* 2.6*    CBG: No results for input(s): GLUCAP in the last 168 hours.   Recent Results (from the past 240 hour(s))  Resp Panel by RT-PCR (Flu A&B, Covid) Nasopharyngeal Swab     Status: None   Collection Time: 09/24/20  9:25 PM   Specimen: Nasopharyngeal Swab; Nasopharyngeal(NP) swabs in vial transport medium  Result Value Ref Range Status   SARS Coronavirus  2 by RT PCR NEGATIVE NEGATIVE Final    Comment: (NOTE) SARS-CoV-2 target nucleic acids are NOT DETECTED.  The SARS-CoV-2 RNA is generally detectable in upper respiratory specimens during the acute phase of infection. The lowest concentration of SARS-CoV-2 viral copies this assay can detect is 138 copies/mL. A negative result does not preclude SARS-Cov-2 infection and should not be used as the sole basis for treatment or other patient management decisions. A negative result may occur with  improper specimen collection/handling, submission of specimen other than nasopharyngeal swab, presence of viral mutation(s) within the areas targeted by this assay, and inadequate number of viral copies(<138 copies/mL). A negative result must be combined with clinical observations, patient history,  and epidemiological information. The expected result is Negative.  Fact Sheet for Patients:  EntrepreneurPulse.com.au  Fact Sheet for Healthcare Providers:  IncredibleEmployment.be  This test is no t yet approved or cleared by the [place] FDA and  has been authorized for detection and/or diagnosis of SARS-CoV-2 by FDA under an Emergency Use Authorization (EUA). This EUA will remain  in effect (meaning this test can be used) for the duration of the COVID-19 declaration under Section 564(b)(1) of the Act, 21 U.S.C.section 360bbb-3(b)(1), unless the authorization is terminated  or revoked sooner.       Influenza A by PCR NEGATIVE NEGATIVE Final   Influenza B by PCR NEGATIVE NEGATIVE Final    Comment: (NOTE) The Xpert Xpress SARS-CoV-2/FLU/RSV plus assay is intended as an aid in the diagnosis of influenza from Nasopharyngeal swab specimens and should not be used as a sole basis for treatment. Nasal washings and aspirates are unacceptable for Xpert Xpress SARS-CoV-2/FLU/RSV testing.  Fact Sheet for Patients: EntrepreneurPulse.com.au  Fact  Sheet for Healthcare Providers: IncredibleEmployment.be  This test is not yet approved or cleared by the [place] FDA and has been authorized for detection and/or diagnosis of SARS-CoV-2 by FDA under an Emergency Use Authorization (EUA). This EUA will remain in effect (meaning this test can be used) for the duration of the COVID-19 declaration under Section 564(b)(1) of the Act, 21 U.S.C. section 360bbb-3(b)(1), unless the authorization is terminated or revoked.  Performed at [facility], [address], [place], [place] [address]   Surgical PCR screen     Status: None   Collection Time: 09/25/20 11:30 PM   Specimen: Nasal Mucosa; Nasal Swab  Result Value Ref Range Status   MRSA, PCR NEGATIVE NEGATIVE Final   Staphylococcus aureus NEGATIVE NEGATIVE Final    Comment: (NOTE) The Xpert SA Assay (FDA approved for NASAL specimens in patients 56 years of age and older), is one component of a comprehensive surveillance program. It is not intended to diagnose infection nor to guide or monitor treatment. Performed at [facility], [place] [address], [place], [place] [address]      Radiology Studies: DG ERCP BILIARY & PANCREATIC DUCTS  Result Date: 09/30/2020 CLINICAL DATA:  Postop bile leak EXAM: ERCP TECHNIQUE: Multiple spot images obtained with the fluoroscopic device and submitted for interpretation post-procedure. COMPARISON:  Ultrasound 09/25/2020 FINDINGS: A series of fluoroscopic spot images document endoscopic cannulation of the pancreatic duct and subsequently common bile duct. Balloon passage through the CBD. There is incomplete opacification of the intrahepatic biliary tree which appears mildly distended centrally. Cystic duct is patent with multiple filling defects in the lumen of the residual gallbladder, incompletely opacified. There is extravasation of contrast lateral to the gallbladder remnant, around a surgical drain catheter. Later  images document plastic biliary drain catheter placement. IMPRESSION: 1. Bile leak in the region of subtotal cholecystectomy directed towards surgical drain. 2. Endoscopic plastic biliary stent placement. These images were submitted for radiologic interpretation only. Please see the procedural report for the amount of contrast and the fluoroscopy time utilized. Electronically Signed   By: Lucrezia Europe M.D.   On: 09/30/2020 07:14    Scheduled Meds: . amLODipine  10 mg Oral Daily  . aspirin EC  81 mg Oral QHS  . enoxaparin (LOVENOX) injection  40 mg Subcutaneous Q24H  . guaiFENesin  600 mg Oral BID  . levothyroxine  88 mcg Oral Q0600  . lidocaine  1 patch Transdermal Q24H  . multivitamin  1 tablet Oral Daily  . pantoprazole  40 mg Oral Daily  . propranolol  40 mg Oral QHS  . rosuvastatin  10 mg Oral QHS  . senna  2 tablet Oral Daily   Continuous Infusions: . sodium chloride    . cefTRIAXone (ROCEPHIN)  IV Stopped (09/28/20 1900)  . lactated ringers 50 mL/hr at 09/29/20 2039     LOS: 3 days       Phillips Climes, MD [facility]   To contact the attending provider between 7A-7P or the covering provider during after hours 7P-7A, please log into the web site [email or web address] and access using universal [place] password for that web site. If you do not have the password, please call the hospital operator.  09/30/2020, 5:15 PM

## 2020-09-30 NOTE — Progress Notes (Signed)
1 Day Post-Op  Subjective: CC: Patient reports continued some right upper quadrant soreness but this is improved after ERCP yesterday.  Drain still bilious.  He is tolerating full liquids, finishing three quarters of his grits without increased pain, nausea or vomiting.  He has been working with PT.  Mobilizing with walker in the room per patient's daughter who is bedside.  No BM since 5/1.  Objective: Vital signs in last 24 hours: Temp:  [97.6 F (36.4 C)-98.3 F (36.8 C)] 97.8 F (36.6 C) (05/07 0331) Pulse Rate:  [47-92] 54 (05/07 0335) Resp:  [15-21] 15 (05/07 0331) BP: (128-165)/(48-87) 140/69 (05/07 0331) SpO2:  [90 %-100 %] 97 % (05/07 0335) Weight:  [65.8 kg] 65.8 kg (05/06 1527) Last BM Date: 09/24/20  Intake/Output from previous day: 05/06 0701 - 05/07 0700 In: 1415.4 [I.V.:1415.4] Out: 645 [Urine:300; Drains:345] Intake/Output this shift: No intake/output data recorded.  PE: Gen:  Alert, NAD, pleasant Pulm: normal rate and effort  Abd: Soft, ND, appropriately tender around laparoscopic incision with some RUQ tenderness. No peritonitis. +BS. Drain with bilious output. Incisions with glue intact appears well and are without drainage, bleeding, or signs of infection Ext:  No LE edema  Skin: no rashes noted, warm and dry   Lab Results:  Recent Labs    09/29/20 0127 09/30/20 0041  WBC 13.0* 8.7  HGB 14.1 14.4  HCT 42.7 43.2  PLT 248 208   BMET Recent Labs    09/29/20 0127 09/30/20 0041  NA 135 134*  K 4.2 4.8  CL 104 101  CO2 23 22  GLUCOSE 108* 118*  BUN 24* 25*  CREATININE 1.37* 1.50*  CALCIUM 9.6 9.5   PT/INR No results for input(s): LABPROT, INR in the last 72 hours. CMP     Component Value Date/Time   NA 134 (L) 09/30/2020 0041   K 4.8 09/30/2020 0041   CL 101 09/30/2020 0041   CO2 22 09/30/2020 0041   GLUCOSE 118 (H) 09/30/2020 0041   BUN 25 (H) 09/30/2020 0041   CREATININE 1.50 (H) 09/30/2020 0041   CALCIUM 9.5 09/30/2020 0041    PROT 5.5 (L) 09/30/2020 0041   ALBUMIN 2.6 (L) 09/30/2020 0041   AST 19 09/30/2020 0041   ALT 47 (H) 09/30/2020 0041   ALKPHOS 82 09/30/2020 0041   BILITOT 1.0 09/30/2020 0041   GFRNONAA 45 (L) 09/30/2020 0041   GFRAA (L) 11/08/2008 0420    52        The eGFR has been calculated using the MDRD equation. This calculation has not been validated in all clinical situations. eGFR's persistently <60 mL/min signify possible Chronic Kidney Disease.   Lipase     Component Value Date/Time   LIPASE 42 09/24/2020 1932       Studies/Results: DG ERCP BILIARY & PANCREATIC DUCTS  Result Date: 09/30/2020 CLINICAL DATA:  Postop bile leak EXAM: ERCP TECHNIQUE: Multiple spot images obtained with the fluoroscopic device and submitted for interpretation post-procedure. COMPARISON:  Ultrasound 09/25/2020 FINDINGS: A series of fluoroscopic spot images document endoscopic cannulation of the pancreatic duct and subsequently common bile duct. Balloon passage through the CBD. There is incomplete opacification of the intrahepatic biliary tree which appears mildly distended centrally. Cystic duct is patent with multiple filling defects in the lumen of the residual gallbladder, incompletely opacified. There is extravasation of contrast lateral to the gallbladder remnant, around a surgical drain catheter. Later images document plastic biliary drain catheter placement. IMPRESSION: 1. Bile leak in the region  of subtotal cholecystectomy directed towards surgical drain. 2. Endoscopic plastic biliary stent placement. These images were submitted for radiologic interpretation only. Please see the procedural report for the amount of contrast and the fluoroscopy time utilized. Electronically Signed   By: Lucrezia Europe M.D.   On: 09/30/2020 07:14    Anti-infectives: Anti-infectives (From admission, onward)   Start     Dose/Rate Route Frequency Ordered Stop   09/25/20 1730  cefTRIAXone (ROCEPHIN) 2 g in sodium chloride 0.9 %  100 mL IVPB        2 g 200 mL/hr over 30 Minutes Intravenous Every 24 hours 09/25/20 1634         Assessment/Plan CAD HTN HLD Hypothyroidism Acute delirium - suspect somewhat due to sundowning, try to limit narcotics as able. Appears more alert this am  Acute cholecystitis S/P laparoscopicsubtotal cholecystectomy 09/26/20 Dr. Bobbye Morton - POD#4 - Bile Leak - s/p ERCP and stenting by Dr. Rush Landmark 5/6 - Trend labs. LFT's downtrending. WBC wnl.  - Maintain drain until clearing up - continue to mobilize. PT rec HH - Adv diet   FEN: HH VTE: SCDs, okay for chemical prophylaxis from general surgery standpoint ID: rocephin 5/2>>   LOS: 3 days    Jillyn Ledger , [facility] 09/30/2020, 9:26 AM Please see Amion for pager number during day hours 7:00am-4:30pm

## 2020-09-30 NOTE — Progress Notes (Signed)
Pt JP drain is leaking from insertion site and has saturated the CHG disk. Have paged surgery PA and also secure chatted with no response yet.

## 2020-09-30 NOTE — Progress Notes (Signed)
PT Cancellation Note  Patient Details Name: Juan Benjamin MRN: [id number] DOB: May 21, 1931   Cancelled Treatment:    Reason Eval/Treat Not Completed: Other (comment). Had procedure today, will retry tomorrow.   Ramond Dial 09/30/2020, 10:17 AM   Mee Hives, PT MS Acute Rehab Dept. Number: [place]

## 2020-09-30 NOTE — Progress Notes (Addendum)
Gastroenterology Inpatient Follow-up Note   PATIENT IDENTIFICATION  Juan Benjamin is a 87 y.o. male with a pmh significant for carotid artery disease, CAD, hypertension, hyperlipidemia, hypothyroidism, GERD, prostate cancer, cholecystitis (status post cholecystectomy with bile leak and now status post ERCP).   Hospital Day: 7  SUBJECTIVE  Today the patient is seen and evaluated with his daughter at the bedside. Overall output has decreased from yesterday into today but still remains elevated. Patient having some drainage around his catheter site.  Some abdominal discomfort at his JP drain site. No other abdominal pain suggestive of post ERCP pancreatitis. The patient denies any fevers or chills. Patient has mild appetite improvement.   OBJECTIVE  Scheduled Inpatient Medications:  . amLODipine  10 mg Oral Daily  . aspirin EC  81 mg Oral QHS  . guaiFENesin  600 mg Oral BID  . levothyroxine  88 mcg Oral Q0600  . lidocaine  1 patch Transdermal Q24H  . multivitamin  1 tablet Oral Daily  . pantoprazole  40 mg Oral Daily  . propranolol  40 mg Oral QHS  . rosuvastatin  10 mg Oral QHS  . senna  2 tablet Oral Daily   Continuous Inpatient Infusions:  . sodium chloride    . cefTRIAXone (ROCEPHIN)  IV Stopped (09/28/20 1900)  . lactated ringers 50 mL/hr at 09/29/20 2039   PRN Inpatient Medications: sodium chloride, acetaminophen **OR** acetaminophen, haloperidol lactate, hydrALAZINE, morphine injection, ondansetron **OR** ondansetron (ZOFRAN) IV, traMADol   Physical Examination  Temp:  [97.6 F (36.4 C)-98.3 F (36.8 C)] 97.8 F (36.6 C) (05/07 0331) Pulse Rate:  [47-92] 54 (05/07 0335) Resp:  [15-21] 15 (05/07 0331) BP: (128-165)/(48-87) 140/69 (05/07 0331) SpO2:  [90 %-100 %] 97 % (05/07 0335) Weight:  [65.8 kg] 65.8 kg (05/06 1527) Temp (24hrs), Avg:97.8 F (36.6 C), Min:97.6 F (36.4 C), Max:98.3 F (36.8 C)  Weight: 65.8 kg GEN: NAD, sitting up in bed, appears younger  than stated age, nontoxic, daughter at bedside PSYCH: Cooperative, without pressured speech EYE: Conjunctivae pink, sclerae anicteric ENT: MMM CV: Nontachycardic RESP: No audible wheezing GI: NABS, soft, protuberant abdomen, right upper quadrant drain in place with Tegaderm and some mild bile leakage noted, mild tenderness to palpation in drain region, laparoscopic scars present, without rebound or guarding MSK/EXT: Lower extremity edema present SKIN: No jaundice NEURO:  Alert & Oriented x 3   Review of Data   Laboratory Studies   Recent Labs  Lab 09/26/20 0300 09/27/20 0112 09/30/20 0041  NA 139   < > 134*  K 4.0   < > 4.8  CL 104   < > 101  CO2 25   < > 22  BUN 29*   < > 25*  CREATININE 1.68*   < > 1.50*  GLUCOSE 99   < > 118*  CALCIUM 9.8   < > 9.5  MG 1.7  --   --   PHOS 3.0  --   --    < > = values in this interval not displayed.   Recent Labs  Lab 09/30/20 0041  AST 19  ALT 47*  ALKPHOS 82    Recent Labs  Lab 09/28/20 0625 09/29/20 0127 09/30/20 0041  WBC 13.2* 13.0* 8.7  HGB 12.4* 14.1 14.4  HCT 37.6* 42.7 43.2  PLT 210 248 208   No results for input(s): APTT, INR in the last 168 hours.  Imaging Studies  DG ERCP BILIARY & PANCREATIC DUCTS  Result Date: 09/30/2020  CLINICAL DATA:  Postop bile leak EXAM: ERCP TECHNIQUE: Multiple spot images obtained with the fluoroscopic device and submitted for interpretation post-procedure. COMPARISON:  Ultrasound 09/25/2020 FINDINGS: A series of fluoroscopic spot images document endoscopic cannulation of the pancreatic duct and subsequently common bile duct. Balloon passage through the CBD. There is incomplete opacification of the intrahepatic biliary tree which appears mildly distended centrally. Cystic duct is patent with multiple filling defects in the lumen of the residual gallbladder, incompletely opacified. There is extravasation of contrast lateral to the gallbladder remnant, around a surgical drain catheter. Later  images document plastic biliary drain catheter placement. IMPRESSION: 1. Bile leak in the region of subtotal cholecystectomy directed towards surgical drain. 2. Endoscopic plastic biliary stent placement. These images were submitted for radiologic interpretation only. Please see the procedural report for the amount of contrast and the fluoroscopy time utilized. Electronically Signed   By: Lucrezia Europe M.D.   On: 09/30/2020 07:14   GI Procedures and Studies  ERCP - Hiatal hernia. - J-shaped deformity. Gastritis. Biopsied. - No gross lesions in the duodenal bulb, in the first portion of the duodenum and in the second portion of the duodenum. - The major papilla appeared normal. - Double wire technique as described above was performed for biliary cannulation. - The fluoroscopic examination was suspicious for sludge with the middle third of the main bile duct and lower third of the main bile duct being moderately dilated. - A bile leak was found from the cystic duct region. - A biliary sphincterotomy was performed. - The biliary tree was swept and sludge was found. - One temporary plastic pancreatic stent was placed into the ventral pancreatic duct to decrease PEP. - One plastic biliary stent was placed into the common bile duct to aid in healing of the bile duct leak.   ASSESSMENT  Juan Benjamin is a 87 y.o. male with a pmh significant for carotid artery disease, CAD, hypertension, hyperlipidemia, hypothyroidism, GERD, prostate cancer, cholecystitis (status post cholecystectomy with bile leak and now status post ERCP).    The patient is hemodynamically stable.  Clinically, he seems to be improving slowly.  Bile leak will take a few weeks to see if it will be able to be treated effectively with just plastic stenting, which is my hope.  Should monitor the area of JP drain and if continued drainage from around the site is occurring please see surgical colleagues for further recommendations.  No  evidence of post ERCP pancreatitis.  I will arrange follow-up KUB in 2 weeks to evaluate the pancreas duct stent if it remains or not.  Repeat ERCP in 6 to 8 weeks is our plan.  At time of follow-up ERCP will evaluate if peptic ulcer disease has improved as well.  PLAN/RECOMMENDATIONS  Continue PPI 40 mg daily I will follow-up the pathology from yesterday's procedure KUB in 2 weeks to evaluate PD stent (I will arrange this) Repeat ERCP in 6 to 8 weeks to check on bile leak healing Follow-up with surgery as per JP drain with hope that over next 2 weeks this will decrease significantly If the patient has high-volume bile leak persist then consideration of fully covered self-expanding metal stent placement will be necessary, we hope not Advance diet as tolerated   GI inpatient service will sign off at this time.  Please page/call with questions or concerns.   [name], MD [facility] Office # [phone]    LOS: 3 days  Irving Copas  09/30/2020,  12:59 PM

## 2020-10-01 ENCOUNTER — Encounter (HOSPITAL_COMMUNITY): Payer: Self-pay | Admitting: Certified Registered"

## 2020-10-01 LAB — COMPREHENSIVE METABOLIC PANEL
ALT: 43 U/L (ref 0–44)
AST: 29 U/L (ref 15–41)
Albumin: 2.6 g/dL — ABNORMAL LOW (ref 3.5–5.0)
Alkaline Phosphatase: 79 U/L (ref 38–126)
Anion gap: 7 (ref 5–15)
BUN: 30 mg/dL — ABNORMAL HIGH (ref 8–23)
CO2: 25 mmol/L (ref 22–32)
Calcium: 9.4 mg/dL (ref 8.9–10.3)
Chloride: 103 mmol/L (ref 98–111)
Creatinine, Ser: 1.61 mg/dL — ABNORMAL HIGH (ref 0.61–1.24)
GFR, Estimated: 41 mL/min — ABNORMAL LOW (ref >60)
Glucose, Bld: 103 mg/dL — ABNORMAL HIGH (ref 70–99)
Potassium: 4.3 mmol/L (ref 3.5–5.1)
Sodium: 135 mmol/L (ref 135–145)
Total Bilirubin: 0.6 mg/dL (ref 0.3–1.2)
Total Protein: 5.5 g/dL — ABNORMAL LOW (ref 6.5–8.1)

## 2020-10-01 MED ORDER — SODIUM CHLORIDE 0.9 % IV SOLN: INTRAVENOUS | Status: AC

## 2020-10-01 MED ORDER — ENOXAPARIN SODIUM 40 MG/0.4ML IJ SOSY
40.0000 mg | PREFILLED_SYRINGE | INTRAMUSCULAR | Status: DC
  Administered 2020-10-02 – 2020-10-03 (×2): 40 mg via SUBCUTANEOUS
  Filled 2020-10-01 (×2): qty 0.4

## 2020-10-01 NOTE — Plan of Care (Signed)
  Problem: Education: Goal: Required Educational Video(s) Outcome: Progressing   Problem: Clinical Measurements: Goal: Postoperative complications will be avoided or minimized Outcome: Progressing   Problem: Skin Integrity: Goal: Demonstration of wound healing without infection will improve Outcome: Progressing   

## 2020-10-01 NOTE — Progress Notes (Signed)
[facility] ROUNDING NOTE   Subjective: ERCP completed 5/6 and notable for 10 mm CBD with CBD sludge.  7 mm biliary sphincterotomy followed by balloon sweeps with sludge removed from the ducts.  4 [place] by 7 cm plastic pancreatic stent placed to reduce risk of post ERCP pancreatitis along with a 10 [place] by 7 cm plastic biliary stent placed into the CBD with good flow of bile.  Overnight, continues to have output from JP drain along with reported leak of bilious fluid around drain site.  Repeat labs this morning with stable liver enzymes at AST/ALT/ALP 29/43/79 with T bili 0.6.  Fluid collected from JP and sent for total bilirubin, currently pending.  390 cc output recorded from JP (345 the day prior).   Objective: Vital signs in last 24 hours: Temp:  [98 F (36.7 C)-98.4 F (36.9 C)] 98.4 F (36.9 C) (05/08 0343) Pulse Rate:  [67-78] 67 (05/08 0343) Resp:  [17-19] 19 (05/08 0343) BP: (139-154)/(71-87) 154/87 (05/08 0343) SpO2:  [98 %-100 %] 98 % (05/08 0343) Last BM Date: 09/30/20 General: NAD Lungs:  CTA b/l, no w/r/r Heart:  RRR, no m/r/g Abdomen: Drain in place and RUQ.  Bandage recently changed.  Low volume dark bilious fluid in JP bulb.  Abdomen otherwise soft, NT, ND, +BS Ext:  No c/c/e    Intake/Output from previous day: 05/07 0701 - 05/08 0700 In: -  Out: 590 [Urine:200; Drains:390] Intake/Output this shift: No intake/output data recorded.   Lab Results: Recent Labs    09/29/20 0127 09/30/20 0041  WBC 13.0* 8.7  HGB 14.1 14.4  PLT 248 208  MCV 89.1 87.4   BMET Recent Labs    09/29/20 0127 09/30/20 0041 10/01/20 0127  NA 135 134* 135  K 4.2 4.8 4.3  CL 104 101 103  CO2 23 22 25   GLUCOSE 108* 118* 103*  BUN 24* 25* 30*  CREATININE 1.37* 1.50* 1.61*  CALCIUM 9.6 9.5 9.4   LFT Recent Labs    09/29/20 0127 09/30/20 0041 10/01/20 0127  PROT 5.8* 5.5* 5.5*  ALBUMIN 2.7* 2.6* 2.6*  AST 30 19 29   ALT 63* 47* 43  ALKPHOS 92 82 79   BILITOT 0.6 1.0 0.6   PT/INR No results for input(s): INR in the last 72 hours.    Imaging/Other results: DG ERCP BILIARY & PANCREATIC DUCTS  Result Date: 09/30/2020 CLINICAL DATA:  Postop bile leak EXAM: ERCP TECHNIQUE: Multiple spot images obtained with the fluoroscopic device and submitted for interpretation post-procedure. COMPARISON:  Ultrasound 09/25/2020 FINDINGS: A series of fluoroscopic spot images document endoscopic cannulation of the pancreatic duct and subsequently common bile duct. Balloon passage through the CBD. There is incomplete opacification of the intrahepatic biliary tree which appears mildly distended centrally. Cystic duct is patent with multiple filling defects in the lumen of the residual gallbladder, incompletely opacified. There is extravasation of contrast lateral to the gallbladder remnant, around a surgical drain catheter. Later images document plastic biliary drain catheter placement. IMPRESSION: 1. Bile leak in the region of subtotal cholecystectomy directed towards surgical drain. 2. Endoscopic plastic biliary stent placement. These images were submitted for radiologic interpretation only. Please see the procedural report for the amount of contrast and the fluoroscopy time utilized. Electronically Signed   By: Lucrezia Europe M.D.   On: 09/30/2020 07:14      Assessment and Plan:  1) Bile leak 2) Acute cholecystitis s/p laparoscopic subtotal cholecystectomy on 5/3  ERCP completed 5/6 and notable for 10 mm  CBD with CBD sludge.  7 mm biliary sphincterotomy followed by balloon sweeps with sludge removed from the ducts.  4 [place] by 7 cm plastic pancreatic stent placed to reduce risk of post ERCP pancreatitis along with a 10 [place] by 7 cm plastic biliary stent placed into the CBD with good flow of bile.  - Given continued output from JP drain and reported leakage around drain site, plan for repeat ERCP for metal stent exchange. -Will make n.p.o. at midnight for  tentative ERCP for stent exchange tomorrow pending endoscopy unit space/availability - Management of leaking around JP site per surgical service    Lavena Bullion, DO  10/01/2020, 11:46 AM [facility] Pager [phone]

## 2020-10-01 NOTE — Progress Notes (Signed)
PT Cancellation Note  Patient Details Name: KAYDON HUSBY MRN: [id number] DOB: 11/05/1930   Cancelled Treatment:    PT attempted to see pt for treatment this morning however RN reports pt is lethargic at this time. RN and visitor requesting PT return at a later time. PT will attempt to follow up as time allows.   Zenaida Niece 10/01/2020, 5:04 PM

## 2020-10-01 NOTE — Anesthesia Preprocedure Evaluation (Deleted)
Anesthesia Evaluation    Reviewed: Allergy & Precautions, Patient's Chart, lab work & pertinent test results  Airway        Dental   Pulmonary former smoker,           Cardiovascular hypertension, Pt. on medications and Pt. on home beta blockers + CAD and + Peripheral Vascular Disease       Neuro/Psych negative neurological ROS     GI/Hepatic GERD  ,Bile leak   Endo/Other  Hypothyroidism   Renal/GU negative Renal ROS   H/o prostate cancer     Musculoskeletal negative musculoskeletal ROS (+)   Abdominal   Peds  Hematology negative hematology ROS (+)   Anesthesia Other Findings   Reproductive/Obstetrics                             Anesthesia Physical Anesthesia Plan  ASA: III  Anesthesia Plan: General   Post-op Pain Management:    Induction: Intravenous  PONV Risk Score and Plan: 2 and Dexamethasone and Ondansetron  Airway Management Planned: Oral ETT  Additional Equipment:   Intra-op Plan:   Post-operative Plan: Extubation in OR  Informed Consent:   Plan Discussed with:   Anesthesia Plan Comments:         Anesthesia Quick Evaluation

## 2020-10-01 NOTE — Progress Notes (Signed)
2 Days Post-Op  Subjective: CC: Patient was apparently confused this morning.  He is now reorientated and A&O x 4.  Had more pain in the right upper quadrant yesterday that required IV pain medication.  Continued bilious output from JP drain with some leakage around the wound.  Tolerating soft diet without any nausea or vomiting.  BM yesterday. Up in chair this morning. Did not work with PT yesterday.   Objective: Vital signs in last 24 hours: Temp:  [98 F (36.7 C)-98.4 F (36.9 C)] 98.4 F (36.9 C) (05/08 0343) Pulse Rate:  [67-78] 67 (05/08 0343) Resp:  [17-19] 19 (05/08 0343) BP: (139-154)/(71-87) 154/87 (05/08 0343) SpO2:  [98 %-100 %] 98 % (05/08 0343) Last BM Date: 09/30/20  Intake/Output from previous day: 05/07 0701 - 05/08 0700 In: -  Out: 590 [Urine:200; Drains:390] Intake/Output this shift: No intake/output data recorded.  PE: Gen:  Alert, NAD, pleasant Pulm: normal rate and effort  Abd: Soft, ND, appropriately tender around laparoscopic incision with some RUQ tenderness. Appears stable from yesterday. No peritonitis. +BS. Drain with bilious output.   There is some leakage around the drain.  RN in room and to replace dressing.  There is no induration, erythema or signs of cellulitis.  Incisions with glue intact appears well and are without drainage, bleeding, or signs of infection Ext:  No LE edema  Skin: no rashes noted, warm and dry  Lab Results:  Recent Labs    09/29/20 0127 09/30/20 0041  WBC 13.0* 8.7  HGB 14.1 14.4  HCT 42.7 43.2  PLT 248 208   BMET Recent Labs    09/30/20 0041 10/01/20 0127  NA 134* 135  K 4.8 4.3  CL 101 103  CO2 22 25  GLUCOSE 118* 103*  BUN 25* 30*  CREATININE 1.50* 1.61*  CALCIUM 9.5 9.4   PT/INR No results for input(s): LABPROT, INR in the last 72 hours. CMP     Component Value Date/Time   NA 135 10/01/2020 0127   K 4.3 10/01/2020 0127   CL 103 10/01/2020 0127   CO2 25 10/01/2020 0127   GLUCOSE 103 (H)  10/01/2020 0127   BUN 30 (H) 10/01/2020 0127   CREATININE 1.61 (H) 10/01/2020 0127   CALCIUM 9.4 10/01/2020 0127   PROT 5.5 (L) 10/01/2020 0127   ALBUMIN 2.6 (L) 10/01/2020 0127   AST 29 10/01/2020 0127   ALT 43 10/01/2020 0127   ALKPHOS 79 10/01/2020 0127   BILITOT 0.6 10/01/2020 0127   GFRNONAA 41 (L) 10/01/2020 0127   GFRAA (L) 11/08/2008 0420    52        The eGFR has been calculated using the MDRD equation. This calculation has not been validated in all clinical situations. eGFR's persistently <60 mL/min signify possible Chronic Kidney Disease.   Lipase     Component Value Date/Time   LIPASE 42 09/24/2020 1932       Studies/Results: DG ERCP BILIARY & PANCREATIC DUCTS  Result Date: 09/30/2020 CLINICAL DATA:  Postop bile leak EXAM: ERCP TECHNIQUE: Multiple spot images obtained with the fluoroscopic device and submitted for interpretation post-procedure. COMPARISON:  Ultrasound 09/25/2020 FINDINGS: A series of fluoroscopic spot images document endoscopic cannulation of the pancreatic duct and subsequently common bile duct. Balloon passage through the CBD. There is incomplete opacification of the intrahepatic biliary tree which appears mildly distended centrally. Cystic duct is patent with multiple filling defects in the lumen of the residual gallbladder, incompletely opacified. There is extravasation of  contrast lateral to the gallbladder remnant, around a surgical drain catheter. Later images document plastic biliary drain catheter placement. IMPRESSION: 1. Bile leak in the region of subtotal cholecystectomy directed towards surgical drain. 2. Endoscopic plastic biliary stent placement. These images were submitted for radiologic interpretation only. Please see the procedural report for the amount of contrast and the fluoroscopy time utilized. Electronically Signed   By: Lucrezia Europe M.D.   On: 09/30/2020 07:14    Anti-infectives: Anti-infectives (From admission, onward)   Start      Dose/Rate Route Frequency Ordered Stop   09/25/20 1730  cefTRIAXone (ROCEPHIN) 2 g in sodium chloride 0.9 % 100 mL IVPB        2 g 200 mL/hr over 30 Minutes Intravenous Every 24 hours 09/25/20 1634         Assessment/Plan CAD HTN HLD Hypothyroidism Hiatal hernia noted on ERCP Gastritis noted on ERCP CKD - Cr 1.61 Acute delirium - suspect somewhat due to sundowning, try to limit narcotics as able. Appears more alert this am. Reported to be confused earlier this am  Acute cholecystitis S/P laparoscopicsubtotal cholecystectomy 09/26/20 Dr. Bobbye Morton - POD#5 - Bile Leak - s/p ERCP and plastic stenting by Dr. Rush Landmark 5/6.  Drain is still bilious.  Continue to monitor drainage.  Per GIs note if the patient has high-volume bile leak persist in consideration of fully covered self-expanding metal stent placement will be necessary -Trend labs. LFT's normalized. WBC wnl.  - continue to mobilize. PT rec [facility]  FEN:HH VTE: SCDs, okay for chemical prophylaxis from general surgery standpoint ID: [email or web address] 5/2>>   LOS: 4 days    Jillyn Ledger , [facility] 10/01/2020, 9:06 AM Please see Amion for pager number during day hours 7:00am-4:30pm

## 2020-10-01 NOTE — Progress Notes (Signed)
PROGRESS NOTE    Juan Benjamin  UEA:[id number] DOB: 07/23/1930 DOA: 09/24/2020 PCP: Aletha Halim., PA-C    Chief Complaint  Patient presents with  . Abdominal Pain    Brief admission Narrative:   Juan Benjamin is a 87 y.o. male with medical history significant of CAD, carotid artery dz s/p CEA, HTN.  Pt presents to the ED with c/o abd pain.  Pain onset 3 hours PTA.  This onset was 2h after eating a peanut butter and banana sandwich.  Pain unchanged since onset.  No movement.  Pain is in RUQ.  Pain is constant, moderate.  No N/V/D.  No urinary symptoms, no constipation, no CP. Work-up was significant for acute cholecystitis, as well as finding of new diagnosis of atrial fibrillation. Patient went for laparoscopic cholecystectomy 5/3, total cholecystectomy of necrotic bladder, patient with continued significant biliary leak through JP drain, GI consulted, plan for ERCP 5/6  Subjective:  With some confusion overnight, some nausea after receiving p.o. oxycodone yesterday, no vomiting, good bowel movement after magnesium citrate yesterday, reports some discomfort in right upper quadrant  Assessment & Plan:  acute cholecystitis - S/p laparoscopic subtotal cholecystectomy on 09/26/2020 by Dr. Bobbye Morton -Continue Rocephin -No febrile at this moment and denies nausea or vomiting -WBCs trending down -LFTs downtrending -Tolerating soft diet -Continue as needed antiemetics and analgesics. -We will try to mobilize, PT/OT consulted, encouraged with incentive spirometer. -patient with significant bilious drainage through JP drain, concern for.  Leak, GI input appreciated, s/p ERCP 5/6, status post temporary plastic pancreatic stent, and sphincterectomy and biliary tree was swept and sludge was found .  Is with high output, and drainage at insertion site, so plan to repeat ERCP with stent exchange tomorrow.  Hypertension -Overall stable and well-controlled -Continue current  antihypertensive agents and follow vital signs.  New onset a-fib ([place]) -Appears to be paroxysmal in nature -Currently rate controlled -Following cardiology recommendations patient will need DOAC once clear by surgery to provide anticoagulation.   -Further decision about full anticoagulation pending clinical progression, possible need for further procedures, for now we will continue with DVT prophylaxis dose. -Continue telemetry monitoring  hyperlipidemia -Continue Crestor  hypothyroidism -Continue Synthroid  Transaminitis -Due to cholecystitis, trending down.   AAA -This is incidental finding on imaging good, vascular surgery input greatly appreciated, recommendation for outpatient follow-up for possible need for surgical repair  DVT prophylaxis: lovenox(will hold this evening for ERCP tomorrow) Code Status: Full code Family Communication: Niece at bedside. Disposition:   Status is: INPATIENT  Dispo: The patient is from: Home              Anticipated d/c is to: Home              Patient currently not medically stable for discharge; status postcholecystectomy, demonstrating elevated LFTs and waiting for intestine to work-up.  Slowly advancing diet and will follow general surgery postoperative recommendations.   Difficult to place patient no       Consultants:   General surgery  Cardiology  Vascular surgery  GI   Procedures:  Status post laparoscopic cholecystectomy 09/26/2020   Antimicrobials:  Rocephin      Objective: Vitals:   09/30/20 1429 09/30/20 2010 10/01/20 0343 10/01/20 1332  BP:  139/71 (!) 154/87 (!) 127/100  Pulse:  78 67 66  Resp: 17 18 19 14   Temp: 98 F (36.7 C) 98.1 F (36.7 C) 98.4 F (36.9 C) 97.9 F (36.6 C)  TempSrc: Oral Axillary Axillary  Axillary  SpO2:  100% 98% 98%  Weight:      Height:        Intake/Output Summary (Last 24 hours) at 10/01/2020 1502 Last data filed at 10/01/2020 1000 Gross per 24 hour  Intake 0 ml  Output  590 ml  Net -590 ml   Filed Weights   09/24/20 1841 09/29/20 1527  Weight: 65.8 kg 65.8 kg    Examination:  Awake Alert, frail, deconditioned, easily distracted  symmetrical Chest wall movement, Good air movement bilaterally, CTAB RRR,No Gallops,Rubs or new Murmurs, No Parasternal Heave +ve B.Sounds, Abd Soft, some right upper quadrant discomfort, still with drain, no fluids at insertion site No Cyanosis, Clubbing or edema, No new Rash or bruise       Data Reviewed: I have personally reviewed following labs and imaging studies  CBC: Recent Labs  Lab 09/24/20 1932 09/25/20 0500 09/26/20 0300 09/26/20 1440 09/28/20 0625 09/29/20 0127 09/30/20 0041  WBC 11.4*   < > 16.3* 13.2* 13.2* 13.0* 8.7  NEUTROABS 8.2*  --   --   --   --   --   --   HGB 15.0   < > 14.4 13.3 12.4* 14.1 14.4  HCT 45.4   < > 45.2 41.4 37.6* 42.7 43.2  MCV 90.3   < > 91.9 90.6 88.9 89.1 87.4  PLT 197   < > 169 154 210 248 208   < > = values in this interval not displayed.    Basic Metabolic Panel: Recent Labs  Lab 09/26/20 0300 09/27/20 0112 09/28/20 0625 09/29/20 0127 09/30/20 0041 10/01/20 0127  NA 139 138 136 135 134* 135  K 4.0 4.4 4.3 4.2 4.8 4.3  CL 104 106 103 104 101 103  CO2 25 26 24 23 22 25   GLUCOSE 99 125* 98 108* 118* 103*  BUN 29* 25* 29* 24* 25* 30*  CREATININE 1.68* 1.45* 1.39* 1.37* 1.50* 1.61*  CALCIUM 9.8 9.0 9.4 9.6 9.5 9.4  MG 1.7  --   --   --   --   --   PHOS 3.0  --   --   --   --   --     GFR: Estimated Creatinine Clearance: 30.1 mL/min (A) (by C-G formula based on SCr of 1.61 mg/dL (H)).  Liver Function Tests: Recent Labs  Lab 09/27/20 0112 09/28/20 0625 09/29/20 0127 09/30/20 0041 10/01/20 0127  AST 84* 38 30 19 29   ALT 130* 83* 63* 47* 43  ALKPHOS 86 87 92 82 79  BILITOT 0.3 0.8 0.6 1.0 0.6  PROT 4.8* 5.3* 5.8* 5.5* 5.5*  ALBUMIN 2.3* 2.5* 2.7* 2.6* 2.6*    CBG: No results for input(s): GLUCAP in the last 168 hours.   Recent Results (from  the past 240 hour(s))  Resp Panel by RT-PCR (Flu A&B, Covid) Nasopharyngeal Swab     Status: None   Collection Time: 09/24/20  9:25 PM   Specimen: Nasopharyngeal Swab; Nasopharyngeal(NP) swabs in vial transport medium  Result Value Ref Range Status   SARS Coronavirus 2 by RT PCR NEGATIVE NEGATIVE Final    Comment: (NOTE) SARS-CoV-2 target nucleic acids are NOT DETECTED.  The SARS-CoV-2 RNA is generally detectable in upper respiratory specimens during the acute phase of infection. The lowest concentration of SARS-CoV-2 viral copies this assay can detect is 138 copies/mL. A negative result does not preclude SARS-Cov-2 infection and should not be used as the sole basis for treatment or other patient management decisions. A  negative result may occur with  improper specimen collection/handling, submission of specimen other than nasopharyngeal swab, presence of viral mutation(s) within the areas targeted by this assay, and inadequate number of viral copies(<138 copies/mL). A negative result must be combined with clinical observations, patient history, and epidemiological information. The expected result is Negative.  Fact Sheet for Patients:  EntrepreneurPulse.com.au  Fact Sheet for Healthcare Providers:  IncredibleEmployment.be  This test is no t yet approved or cleared by the [place] FDA and  has been authorized for detection and/or diagnosis of SARS-CoV-2 by FDA under an Emergency Use Authorization (EUA). This EUA will remain  in effect (meaning this test can be used) for the duration of the COVID-19 declaration under Section 564(b)(1) of the Act, 21 U.S.C.section 360bbb-3(b)(1), unless the authorization is terminated  or revoked sooner.       Influenza A by PCR NEGATIVE NEGATIVE Final   Influenza B by PCR NEGATIVE NEGATIVE Final    Comment: (NOTE) The Xpert Xpress SARS-CoV-2/FLU/RSV plus assay is intended as an aid in the diagnosis of  influenza from Nasopharyngeal swab specimens and should not be used as a sole basis for treatment. Nasal washings and aspirates are unacceptable for Xpert Xpress SARS-CoV-2/FLU/RSV testing.  Fact Sheet for Patients: EntrepreneurPulse.com.au  Fact Sheet for Healthcare Providers: IncredibleEmployment.be  This test is not yet approved or cleared by the [place] FDA and has been authorized for detection and/or diagnosis of SARS-CoV-2 by FDA under an Emergency Use Authorization (EUA). This EUA will remain in effect (meaning this test can be used) for the duration of the COVID-19 declaration under Section 564(b)(1) of the Act, 21 U.S.C. section 360bbb-3(b)(1), unless the authorization is terminated or revoked.  Performed at [facility], [address] [place], [place] [address]   Surgical PCR screen     Status: None   Collection Time: 09/25/20 11:30 PM   Specimen: Nasal Mucosa; Nasal Swab  Result Value Ref Range Status   MRSA, PCR NEGATIVE NEGATIVE Final   Staphylococcus aureus NEGATIVE NEGATIVE Final    Comment: (NOTE) The Xpert SA Assay (FDA approved for NASAL specimens in patients 45 years of age and older), is one component of a comprehensive surveillance program. It is not intended to diagnose infection nor to guide or monitor treatment. Performed at [facility], [place] [address] [place], [place] [address]      Radiology Studies: DG ERCP BILIARY & PANCREATIC DUCTS  Result Date: 09/30/2020 CLINICAL DATA:  Postop bile leak EXAM: ERCP TECHNIQUE: Multiple spot images obtained with the fluoroscopic device and submitted for interpretation post-procedure. COMPARISON:  Ultrasound 09/25/2020 FINDINGS: A series of fluoroscopic spot images document endoscopic cannulation of the pancreatic duct and subsequently common bile duct. Balloon passage through the CBD. There is incomplete opacification of the intrahepatic biliary tree  which appears mildly distended centrally. Cystic duct is patent with multiple filling defects in the lumen of the residual gallbladder, incompletely opacified. There is extravasation of contrast lateral to the gallbladder remnant, around a surgical drain catheter. Later images document plastic biliary drain catheter placement. IMPRESSION: 1. Bile leak in the region of subtotal cholecystectomy directed towards surgical drain. 2. Endoscopic plastic biliary stent placement. These images were submitted for radiologic interpretation only. Please see the procedural report for the amount of contrast and the fluoroscopy time utilized. Electronically Signed   By: Lucrezia Europe M.D.   On: 09/30/2020 07:14    Scheduled Meds: . amLODipine  10 mg Oral Daily  .  aspirin EC  81 mg Oral QHS  . enoxaparin (LOVENOX) injection  40 mg Subcutaneous Q24H  . guaiFENesin  600 mg Oral BID  . levothyroxine  88 mcg Oral Q0600  . lidocaine  1 patch Transdermal Q24H  . multivitamin  1 tablet Oral Daily  . pantoprazole  40 mg Oral Daily  . propranolol  40 mg Oral QHS  . rosuvastatin  10 mg Oral QHS  . senna  2 tablet Oral Daily   Continuous Infusions: . sodium chloride    . sodium chloride 75 mL/hr at 10/01/20 0830  . cefTRIAXone (ROCEPHIN)  IV 2 g (09/30/20 1807)     LOS: 4 days       Phillips Climes, MD [facility]   To contact the attending provider between 7A-7P or the covering provider during after hours 7P-7A, please log into the web site [email or web address] and access using universal [place] password for that web site. If you do not have the password, please call the hospital operator.  10/01/2020, 3:02 PM

## 2020-10-01 NOTE — Progress Notes (Signed)
Patient very confused and agitated this AM. Patient stating he needs to get up to go to his wife and go see the doctors. Patient was reoriented back to reality. However patient continues to try and get up. PRN Haldol given. Will continue to monitor for the rest of the shift.

## 2020-10-02 ENCOUNTER — Encounter (HOSPITAL_COMMUNITY): Payer: Self-pay | Admitting: Gastroenterology

## 2020-10-02 ENCOUNTER — Encounter (HOSPITAL_COMMUNITY)
Admission: EM | Disposition: A | Discharge status: Home Health | Payer: Self-pay | Source: Home / Self Care | Attending: Internal Medicine

## 2020-10-02 ENCOUNTER — Telehealth: Payer: Self-pay

## 2020-10-02 DIAGNOSIS — T85528A Displacement of other gastrointestinal prosthetic devices, implants and grafts, initial encounter: Secondary | ICD-10-CM

## 2020-10-02 DIAGNOSIS — I350 Nonrheumatic aortic (valve) stenosis: Secondary | ICD-10-CM

## 2020-10-02 DIAGNOSIS — K839 Disease of biliary tract, unspecified: Secondary | ICD-10-CM

## 2020-10-02 DIAGNOSIS — I4891 Unspecified atrial fibrillation: Secondary | ICD-10-CM

## 2020-10-02 DIAGNOSIS — I10 Essential (primary) hypertension: Secondary | ICD-10-CM

## 2020-10-02 SURGERY — CANCELLED PROCEDURE

## 2020-10-02 MED ORDER — EPINEPHRINE 1 MG/10ML IJ SOSY
PREFILLED_SYRINGE | INTRAMUSCULAR | Status: AC
  Filled 2020-10-02: qty 10

## 2020-10-02 MED ORDER — ENSURE ENLIVE PO LIQD
237.0000 mL | Freq: Three times a day (TID) | ORAL | Status: DC
  Administered 2020-10-02 – 2020-10-03 (×2): 237 mL via ORAL

## 2020-10-02 MED ORDER — GLUCAGON HCL RDNA (DIAGNOSTIC) 1 MG IJ SOLR
INTRAMUSCULAR | Status: AC
  Filled 2020-10-02: qty 2

## 2020-10-02 MED ORDER — INDOMETHACIN 50 MG RE SUPP
RECTAL | Status: AC
  Filled 2020-10-02: qty 2

## 2020-10-02 NOTE — Progress Notes (Addendum)
3 Days Post-Op  Subjective: CC: Doing better today. Daughter reports he was tired yesterday after getting oxycodone. Tolerates morphine and ultram better. This has been removed from med list. No abdominal pain, n/v this am. Drain still bilious but output downtrending from 345 > 390 > 85  Objective: Vital signs in last 24 hours: Temp:  [97.9 F (36.6 C)-98.1 F (36.7 C)] 97.9 F (36.6 C) (05/09 0507) Pulse Rate:  [55-66] 55 (05/09 0507) Resp:  [14-17] 17 (05/09 0507) BP: (127-167)/(60-100) 140/62 (05/09 0507) SpO2:  [94 %-98 %] 97 % (05/09 0507) Last BM Date: 09/30/20  Intake/Output from previous day: 05/08 0701 - 05/09 0700 In: 0  Out: 385 [Urine:300; Drains:85] Intake/Output this shift: No intake/output data recorded.  PE: Gen: Alert, NAD, pleasant Pulm: normal rate and effort Abd: Soft,ND, essentially NT w/ very mild and appropriate tenderness around laparoscopic incisions. No RUQ tenderness. No peritonitis. +BS. Drain with bilious output.  No leakage around the drain and dressing c/d/i. There is no surrounding induration, erythema or signs of cellulitis. Incisions with glue intact appears well and are without drainage, bleeding, or signs of infection Ext: No LE edema Skin: no rashes noted, warm and dry  Lab Results:  Recent Labs    09/30/20 0041  WBC 8.7  HGB 14.4  HCT 43.2  PLT 208   BMET Recent Labs    09/30/20 0041 10/01/20 0127  NA 134* 135  K 4.8 4.3  CL 101 103  CO2 22 25  GLUCOSE 118* 103*  BUN 25* 30*  CREATININE 1.50* 1.61*  CALCIUM 9.5 9.4   PT/INR No results for input(s): LABPROT, INR in the last 72 hours. CMP     Component Value Date/Time   NA 135 10/01/2020 0127   K 4.3 10/01/2020 0127   CL 103 10/01/2020 0127   CO2 25 10/01/2020 0127   GLUCOSE 103 (H) 10/01/2020 0127   BUN 30 (H) 10/01/2020 0127   CREATININE 1.61 (H) 10/01/2020 0127   CALCIUM 9.4 10/01/2020 0127   PROT 5.5 (L) 10/01/2020 0127   ALBUMIN 2.6 (L) 10/01/2020  0127   AST 29 10/01/2020 0127   ALT 43 10/01/2020 0127   ALKPHOS 79 10/01/2020 0127   BILITOT 0.6 10/01/2020 0127   GFRNONAA 41 (L) 10/01/2020 0127   GFRAA (L) 11/08/2008 0420    52        The eGFR has been calculated using the MDRD equation. This calculation has not been validated in all clinical situations. eGFR's persistently <60 mL/min signify possible Chronic Kidney Disease.   Lipase     Component Value Date/Time   LIPASE 42 09/24/2020 1932       Studies/Results: No results found.  Anti-infectives: Anti-infectives (From admission, onward)   Start     Dose/Rate Route Frequency Ordered Stop   09/25/20 1730  cefTRIAXone (ROCEPHIN) 2 g in sodium chloride 0.9 % 100 mL IVPB        2 g 200 mL/hr over 30 Minutes Intravenous Every 24 hours 09/25/20 1634         Assessment/Plan CAD HTN HLD Hypothyroidism Hiatal hernia noted on ERCP Gastritis noted on ERCP CKD - Cr 1.61 Acute delirium - suspect somewhat due to sundowning, try to limit narcotics as able. Appears more alert this am. Reported to be confused yesterday   Acute cholecystitis S/P laparoscopicsubtotal cholecystectomy 09/26/20 Dr. Bobbye Morton - POD#6 -Bile Leak - s/p ERCP and plastic stenting by Dr. Rush Landmark 5/6. Per GIs note they are planning repeat  ERCP today for self-expanding metal stent placement. His pain has resolved overnight, he is NT and while drain is still bilious output is downtrending from 345 > 390 > 85. Await final recs to see if they proceed with ERCP today vs monitor.  -No labs this am. On yesterdays labs - LFT's normalized. WBC wnl.  - continue to mobilize. PT rec [facility]  FEN:NPO for possible ERCP today  DQQ:IWLN, lovenox ID: [email or web address] 5/2>>  LOS: 5 days    Jillyn Ledger , [facility] 10/02/2020, 8:24 AM Please see Amion for pager number during day hours 7:00am-4:30pm

## 2020-10-02 NOTE — Telephone Encounter (Signed)
The pt has been set up for follow up appt on 11/03/20 at 11:10 am.  Order for KUB and LFTs entered.  The pt is still currently admitted. I will mail letter.

## 2020-10-02 NOTE — Progress Notes (Signed)
PT Cancellation Note  Patient Details Name: Juan Benjamin MRN: [id number] DOB: [birth year]   Cancelled Treatment:    Reason Eval/Treat Not Completed: Patient at procedure or test/unavailable per RN, patient going to an additional procedure this morning- will check back later if time/schedule allow, otherwise will attempt on next date of service.    Windell Norfolk, DPT, PN1   Supplemental Physical Therapist [facility]    Pager [phone] Acute Rehab Office [phone]

## 2020-10-02 NOTE — Progress Notes (Signed)
Progress Note  Patient Name: Juan Benjamin Date of Encounter: 10/02/2020  Primary Cardiologist: Minus Breeding, MD  Subjective   No cardiac complaints. Niece  concerned about JP tube site   Inpatient Medications    Scheduled Meds: . amLODipine  10 mg Oral Daily  . aspirin EC  81 mg Oral QHS  . enoxaparin (LOVENOX) injection  40 mg Subcutaneous Q24H  . guaiFENesin  600 mg Oral BID  . levothyroxine  88 mcg Oral Q0600  . lidocaine  1 patch Transdermal Q24H  . multivitamin  1 tablet Oral Daily  . pantoprazole  40 mg Oral Daily  . propranolol  40 mg Oral QHS  . rosuvastatin  10 mg Oral QHS  . senna  2 tablet Oral Daily   Continuous Infusions: . sodium chloride    . cefTRIAXone (ROCEPHIN)  IV 2 g (10/01/20 1819)   PRN Meds: sodium chloride, acetaminophen **OR** acetaminophen, haloperidol lactate, hydrALAZINE, morphine injection, ondansetron **OR** ondansetron (ZOFRAN) IV, traMADol   Vital Signs    Vitals:   10/01/20 2009 10/01/20 2028 10/02/20 0022 10/02/20 0507  BP: 140/64  (!) 167/60 140/62  Pulse: 66  65 (!) 55  Resp: 17   17  Temp: 97.9 F (36.6 C) 98.1 F (36.7 C)  97.9 F (36.6 C)  TempSrc: Axillary Rectal  Oral  SpO2: 94%   97%  Weight:      Height:        Intake/Output Summary (Last 24 hours) at 10/02/2020 0839 Last data filed at 10/02/2020 0700 Gross per 24 hour  Intake 0 ml  Output 385 ml  Net -385 ml   Last 3 Weights 09/29/2020 09/24/2020 03/21/2020  Weight (lbs) 145 lb 1 oz 145 lb 147 lb 0.8 oz  Weight (kg) 65.8 kg 65.772 kg 66.7 kg     Telemetry    NSR rates 60's 10/02/2020   Physical Exam   Affect appropriate Elderly white male  HEENT: normal Neck supple with no adenopathy JVP normal no bruits no thyromegaly Lungs clear with no wheezing and good diaphragmatic motion Heart:  S1/S2 AS  murmur, no rub, gallop or click PMI normal Abdomen: benighn, JP drain site looks fine  Distal pulses intact with no bruits No edema Neuro non-focal Skin  warm and dry No muscular weakness   Labs    High Sensitivity Troponin:   Recent Labs  Lab 09/24/20 1932 09/24/20 2125  TROPONINIHS 9 12      Cardiac EnzymesNo results for input(s): TROPONINI in the last 168 hours. No results for input(s): TROPIPOC in the last 168 hours.   Chemistry Recent Labs  Lab 09/29/20 0127 09/30/20 0041 10/01/20 0127  NA 135 134* 135  K 4.2 4.8 4.3  CL 104 101 103  CO2 23 22 25   GLUCOSE 108* 118* 103*  BUN 24* 25* 30*  CREATININE 1.37* 1.50* 1.61*  CALCIUM 9.6 9.5 9.4  PROT 5.8* 5.5* 5.5*  ALBUMIN 2.7* 2.6* 2.6*  AST 30 19 29   ALT 63* 47* 43  ALKPHOS 92 82 79  BILITOT 0.6 1.0 0.6  GFRNONAA 50* 45* 41*  ANIONGAP 8 11 7      Hematology Recent Labs  Lab 09/28/20 0625 09/29/20 0127 09/30/20 0041  WBC 13.2* 13.0* 8.7  RBC 4.23 4.79 4.94  HGB 12.4* 14.1 14.4  HCT 37.6* 42.7 43.2  MCV 88.9 89.1 87.4  MCH 29.3 29.4 29.1  MCHC 33.0 33.0 33.3  RDW 14.1 14.0 13.6  PLT 210 248 208  BNPNo results for input(s): BNP, PROBNP in the last 168 hours.   DDimer No results for input(s): DDIMER in the last 168 hours.   Radiology    No results found.  Cardiac Studies   2D Echo 09/25/20 1. Left ventricular ejection fraction, by estimation, is 55%. The left  ventricle has normal function. The left ventricle has no regional wall  motion abnormalities. Left ventricular diastolic parameters are consistent  with Grade I diastolic dysfunction  (impaired relaxation).  2. Right ventricular systolic function is mildly reduced. The right  ventricular size is normal. Tricuspid regurgitation signal is inadequate  for assessing PA pressure.  3. Left atrial size was mildly dilated.  4. Right atrial size was mildly dilated.  5. The mitral valve is normal in structure. Trivial mitral valve  regurgitation. No evidence of mitral stenosis.  6. The aortic valve is tricuspid. Aortic valve regurgitation is trivial.  Moderate aortic valve stenosis. Aortic  valve mean gradient measures 29.0  mmHg, AVA 1.07 cm^2.  7. Aortic dilatation noted. There is mild dilatation of the aortic root,  measuring 38 mm.   Patient Profile     87 y.o. 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 For ERCP. Noted brief PAF during admission    Assessment & Plan    1. Acute cholecystitis s/p laparoscopicsubtotal cholecystectomy 09/26/20 - with JP drain placement site looks fine to me. For ERCP   2. Paroxysmal atrial fib - NSR given brief nature would not anticoagulate in need further procedures for GB as well as left iliac aneurysm   3. CAD s/p CABG - on ASA, BB, statin at baseline, no angina reported  4. PAD - historically has had carotid disease followed by Dr. Percival Spanish, - now with AAA and iliac aneurysm outpatient f/u Dr Trula Slade [facility] for stent Graft at some point   5. Moderate aortic stenosis - updated echo 09/25/20 with moderate AS - asymptomatic   6. CKD stage IIIa - Cr appears near baseline 1,6   7. Essential HTN - Well controlled.  Continue current medications and low sodium Dash type diet.    Discussed care with niece   For questions or updates, please contact [place] Please consult [email or web address] for contact info under Cardiology/STEMI.  Signed, Jenkins Rouge, MD 10/02/2020, 8:39 AM

## 2020-10-02 NOTE — Progress Notes (Addendum)
Progress Note  Chief Complaint:   Bile duct leak      ASSESSMENT / PLAN:    87 yo male with CAD, carotid artery disease s/p CEA, HTN, hypothyroidisma. Admitted with acute cholecystitis s/p lap subtotal cholecystectomy on 5/3 with bile leak. He underwent ERCP notable for a 10 mm CBD and CBD sludge. Plastic stent place with good flow of bile --On Rocephin. WBC normal ( yesterday labs)  # ? Ongoing bile leak.  Spoke with RN, only 25 cc drain output last night. Only ~ 15 cc bilious fluid in drain now. He doesn't seem to be leaking around drain site anymore.  --Plan this am was for repeat ERCP with stent exchange. But he seems to be improving, will hold off on ERCP this am. Will check am labs --Resuming Heart Healthy diet    Attending Physician Note   I have taken an interval history, reviewed the chart and examined the patient. I agree with the Advanced Practitioner's note, impression and recommendations.   Abdominal pain improved. Decreased drain output: 25 cc overnight and 15 cc this morning. No leakage around the drain site this morning.   Bile leak seems to be resolving. ERCP today is canceled. Follow clinical status and drain output. KUB in 10-14 days to ensure the PD stent has successfully migrated. ERCP in 6 weeks to remove / exchange stent as indicated with Dr. Rush Landmark.    S/P subtotal lap cholecystectomy.   Lucio Edward, MD FACG [phone]       SUBJECTIVE:    Feels okay, no abdominal pain this am. Niece in room   OBJECTIVE:    Scheduled inpatient medications:  . amLODipine  10 mg Oral Daily  . aspirin EC  81 mg Oral QHS  . enoxaparin (LOVENOX) injection  40 mg Subcutaneous Q24H  . guaiFENesin  600 mg Oral BID  . levothyroxine  88 mcg Oral Q0600  . lidocaine  1 patch Transdermal Q24H  . multivitamin  1 tablet Oral Daily  . pantoprazole  40 mg Oral Daily  . propranolol  40 mg Oral QHS  . rosuvastatin  10 mg Oral QHS  . senna  2 tablet Oral Daily    Continuous inpatient infusions:  . sodium chloride    . cefTRIAXone (ROCEPHIN)  IV 2 g (10/01/20 1819)   PRN inpatient medications: sodium chloride, acetaminophen **OR** acetaminophen, haloperidol lactate, hydrALAZINE, morphine injection, ondansetron **OR** ondansetron (ZOFRAN) IV, traMADol  Vital signs in last 24 hours: Temp:  [97.9 F (36.6 C)-98.1 F (36.7 C)] 97.9 F (36.6 C) (05/09 0507) Pulse Rate:  [55-66] 55 (05/09 0507) Resp:  [14-17] 17 (05/09 0507) BP: (127-167)/(60-100) 140/62 (05/09 0507) SpO2:  [94 %-98 %] 97 % (05/09 0507) Last BM Date: 09/30/20  Intake/Output Summary (Last 24 hours) at 10/02/2020 0837 Last data filed at 10/02/2020 0700 Gross per 24 hour  Intake 0 ml  Output 385 ml  Net -385 ml     Physical Exam:  . General: Alert male in NAD . Heart:  Regular rate and rhythm, murmur present. No lower extremity edema . Pulmonary: Normal respiratory effort . Abdomen: Soft, nondistended, nontender. Normal bowel sounds.  . Neurologic: Alert and oriented . Psych: Pleasant. Cooperative.   Filed Weights   09/24/20 1841 09/29/20 1527  Weight: 65.8 kg 65.8 kg    Intake/Output from previous day: 05/08 0701 - 05/09 0700 In: 0  Out: 385 [Urine:300; Drains:85] Intake/Output this shift: No intake/output data recorded.    Lab  Results: Recent Labs    09/30/20 0041  WBC 8.7  HGB 14.4  HCT 43.2  PLT 208   BMET Recent Labs    09/30/20 0041 10/01/20 0127  NA 134* 135  K 4.8 4.3  CL 101 103  CO2 22 25  GLUCOSE 118* 103*  BUN 25* 30*  CREATININE 1.50* 1.61*  CALCIUM 9.5 9.4   LFT Recent Labs    10/01/20 0127  PROT 5.5*  ALBUMIN 2.6*  AST 29  ALT 43  ALKPHOS 79  BILITOT 0.6     Principal Problem:   Abdominal pain Active Problems:   Hypertension   New onset a-fib (HCC)   Acute cholecystitis   Gallbladder sludge   Bile leak     LOS: 5 days   Juan Benjamin ,NP 10/02/2020, 8:37 AM

## 2020-10-02 NOTE — Telephone Encounter (Signed)
-----   Message from Irving Copas., MD sent at 09/29/2020  6:16 PM EDT ----- Chong Sicilian, this patient will need a KUB in 2 weeks to ensure that the pancreas duct stent has fallen out.  Repeat LFTs at that time.  ERCP in 6 to 8 weeks.  Follow-up in clinic in 4 weeks with myself or one of the APP's.  Patient should be discharged over the course of the next few days (over the weekend) please reach out to them next week.  Niece is the point of contact.  Thanks. GM

## 2020-10-02 NOTE — Progress Notes (Signed)
PROGRESS NOTE    Juan Benjamin  XQJ:[id number] DOB: [birth year] DOA: 09/24/2020 PCP: Aletha Halim., PA-C    Chief Complaint  Patient presents with  . Abdominal Pain    Brief admission Narrative:   Juan Benjamin is a 87 y.o. male with medical history significant of CAD, carotid artery dz s/p CEA, HTN.  Pt presents to the ED with c/o abd pain.  Pain onset 3 hours PTA.  This onset was 2h after eating a peanut butter and banana sandwich.  Pain unchanged since onset.  No movement.  Pain is in RUQ.  Pain is constant, moderate.  No N/V/D.  No urinary symptoms, no constipation, no CP. Work-up was significant for acute cholecystitis, as well as finding of new diagnosis of atrial fibrillation. Patient went for laparoscopic cholecystectomy 5/3,subtotal cholecystectomy of necrotic bladder, patient with continued significant biliary leak through JP drain, GI consulted, plan for ERCP 5/6, he had stent placement for bile leak, remains with significant output, plan for repeat ERCP with stent exchange 5/9, overnight he is with significant decrease in JP drain output from 500cc /24 hours> 25cc/24 hours, so ERCP was canceled.  Subjective:  No significant events overnight, had good night sleep, feels a lot comfortable after he moved his bowels yesterday, had decreased JP drain output.    Assessment & Plan:  acute cholecystitis - S/p laparoscopic subtotal cholecystectomy on 09/26/2020 by Dr. Bobbye Morton -Continue Rocephin -No febrile at this moment and denies nausea or vomiting -WBCs trending down -LFTs downtrending -Tolerating soft diet -Continue as needed antiemetics and analgesics. -We will try to mobilize, PT/OT consulted, encouraged with incentive spirometer. -patient with significant bilious drainage through JP drain, concern for.  Leak, GI input appreciated, s/p ERCP 5/6, status post temporary plastic pancreatic stent, and sphincterectomy and biliary tree was swept and sludge was found .   -Repeat ERCP with stent exchange canceled this morning given decreased JP drain output from 500 cc to 25 cc  Hypertension -Overall stable and well-controlled -Continue current antihypertensive agents and follow vital signs.  New onset a-fib ([place]) -Appears to be paroxysmal in nature -Currently rate controlled -Appreciate cardiology recommendations, no need for anticoagulation given brief episode, he is currently NSR, an.  hyperlipidemia -Continue Crestor  hypothyroidism -Continue Synthroid  Transaminitis -Due to cholecystitis, trending down.   AAA -This is incidental finding on imaging good, vascular surgery input greatly appreciated, recommendation for outpatient follow-up for possible need for surgical repair  DVT prophylaxis: lovenox Code Status: Full code Family Communication: Niece at bedside. Disposition:   Status is: INPATIENT  Dispo: The patient is from: Home              Anticipated d/c is to: Home              Patient currently not medically stable for discharge; status postcholecystectomy, demonstrating elevated LFTs and waiting for intestine to work-up.  Slowly advancing diet and will follow general surgery postoperative recommendations.   Difficult to place patient no       Consultants:   General surgery  Cardiology  Vascular surgery  GI   Procedures:  Status post laparoscopic cholecystectomy 09/26/2020   Antimicrobials:  Rocephin      Objective: Vitals:   10/01/20 2028 10/02/20 0022 10/02/20 0507 10/02/20 1500  BP:  (!) 167/60 140/62   Pulse:  65 (!) 55 67  Resp:   17 18  Temp: 98.1 F (36.7 C)  97.9 F (36.6 C) 98.9 F (37.2 C)  TempSrc: Rectal  Oral Oral  SpO2:   97% 98%  Weight:      Height:        Intake/Output Summary (Last 24 hours) at 10/02/2020 1508 Last data filed at 10/02/2020 0700 Gross per 24 hour  Intake --  Output 325 ml  Net -325 ml   Filed Weights   09/24/20 1841 09/29/20 1527  Weight: 65.8 kg 65.8 kg     Examination:  Awake Alert, frail, easily distracted  Symmetrical Chest wall movement, Good air movement bilaterally, CTAB RRR,No Gallops,Rubs or new Murmurs, No Parasternal Heave +ve B.Sounds, Abd Soft, tenderness in right upper quadrant, no leak around insertion site No Cyanosis, Clubbing or edema, No new Rash or bruise      Data Reviewed: I have personally reviewed following labs and imaging studies  CBC: Recent Labs  Lab 09/26/20 0300 09/26/20 1440 09/28/20 0625 09/29/20 0127 09/30/20 0041  WBC 16.3* 13.2* 13.2* 13.0* 8.7  HGB 14.4 13.3 12.4* 14.1 14.4  HCT 45.2 41.4 37.6* 42.7 43.2  MCV 91.9 90.6 88.9 89.1 87.4  PLT 169 154 210 248 237    Basic Metabolic Panel: Recent Labs  Lab 09/26/20 0300 09/27/20 0112 09/28/20 0625 09/29/20 0127 09/30/20 0041 10/01/20 0127  NA 139 138 136 135 134* 135  K 4.0 4.4 4.3 4.2 4.8 4.3  CL 104 106 103 104 101 103  CO2 25 26 24 23 22 25   GLUCOSE 99 125* 98 108* 118* 103*  BUN 29* 25* 29* 24* 25* 30*  CREATININE 1.68* 1.45* 1.39* 1.37* 1.50* 1.61*  CALCIUM 9.8 9.0 9.4 9.6 9.5 9.4  MG 1.7  --   --   --   --   --   PHOS 3.0  --   --   --   --   --     GFR: Estimated Creatinine Clearance: 30.1 mL/min (A) (by C-G formula based on SCr of 1.61 mg/dL (H)).  Liver Function Tests: Recent Labs  Lab 09/27/20 0112 09/28/20 0625 09/29/20 0127 09/30/20 0041 10/01/20 0127  AST 84* 38 30 19 29   ALT 130* 83* 63* 47* 43  ALKPHOS 86 87 92 82 79  BILITOT 0.3 0.8 0.6 1.0 0.6  PROT 4.8* 5.3* 5.8* 5.5* 5.5*  ALBUMIN 2.3* 2.5* 2.7* 2.6* 2.6*    CBG: No results for input(s): GLUCAP in the last 168 hours.   Recent Results (from the past 240 hour(s))  Resp Panel by RT-PCR (Flu A&B, Covid) Nasopharyngeal Swab     Status: None   Collection Time: 09/24/20  9:25 PM   Specimen: Nasopharyngeal Swab; Nasopharyngeal(NP) swabs in vial transport medium  Result Value Ref Range Status   SARS Coronavirus 2 by RT PCR NEGATIVE NEGATIVE Final     Comment: (NOTE) SARS-CoV-2 target nucleic acids are NOT DETECTED.  The SARS-CoV-2 RNA is generally detectable in upper respiratory specimens during the acute phase of infection. The lowest concentration of SARS-CoV-2 viral copies this assay can detect is 138 copies/mL. A negative result does not preclude SARS-Cov-2 infection and should not be used as the sole basis for treatment or other patient management decisions. A negative result may occur with  improper specimen collection/handling, submission of specimen other than nasopharyngeal swab, presence of viral mutation(s) within the areas targeted by this assay, and inadequate number of viral copies(<138 copies/mL). A negative result must be combined with clinical observations, patient history, and epidemiological information. The expected result is Negative.  Fact Sheet for Patients:  EntrepreneurPulse.com.au  Fact Sheet for Healthcare Providers:  IncredibleEmployment.be  This test is no t yet approved or cleared by the [place] and  has been authorized for detection and/or diagnosis of SARS-CoV-2 by FDA under an Emergency Use Authorization (EUA). This EUA will remain  in effect (meaning this test can be used) for the duration of the COVID-19 declaration under Section 564(b)(1) of the Act, 21 U.S.C.section 360bbb-3(b)(1), unless the authorization is terminated  or revoked sooner.       Influenza A by PCR NEGATIVE NEGATIVE Final   Influenza B by PCR NEGATIVE NEGATIVE Final    Comment: (NOTE) The Xpert Xpress SARS-CoV-2/FLU/RSV plus assay is intended as an aid in the diagnosis of influenza from Nasopharyngeal swab specimens and should not be used as a sole basis for treatment. Nasal washings and aspirates are unacceptable for Xpert Xpress SARS-CoV-2/FLU/RSV testing.  Fact Sheet for Patients: EntrepreneurPulse.com.au  Fact Sheet for Healthcare  Providers: IncredibleEmployment.be  This test is not yet approved or cleared by the [place] FDA and has been authorized for detection and/or diagnosis of SARS-CoV-2 by FDA under an Emergency Use Authorization (EUA). This EUA will remain in effect (meaning this test can be used) for the duration of the COVID-19 declaration under Section 564(b)(1) of the Act, 21 U.S.C. section 360bbb-3(b)(1), unless the authorization is terminated or revoked.  Performed at [facility], [address], [place], [place] [address]   Surgical PCR screen     Status: None   Collection Time: 09/25/20 11:30 PM   Specimen: Nasal Mucosa; Nasal Swab  Result Value Ref Range Status   MRSA, PCR NEGATIVE NEGATIVE Final   Staphylococcus aureus NEGATIVE NEGATIVE Final    Comment: (NOTE) The Xpert SA Assay (FDA approved for NASAL specimens in patients 61 years of age and older), is one component of a comprehensive surveillance program. It is not intended to diagnose infection nor to guide or monitor treatment. Performed at [facility], [place] [address] [place], [place] [address]      Radiology Studies: No results found.  Scheduled Meds: . amLODipine  10 mg Oral Daily  . aspirin EC  81 mg Oral QHS  . enoxaparin (LOVENOX) injection  40 mg Subcutaneous Q24H  . feeding supplement  237 mL Oral TID BM  . guaiFENesin  600 mg Oral BID  . levothyroxine  88 mcg Oral Q0600  . lidocaine  1 patch Transdermal Q24H  . multivitamin  1 tablet Oral Daily  . pantoprazole  40 mg Oral Daily  . propranolol  40 mg Oral QHS  . rosuvastatin  10 mg Oral QHS  . senna  2 tablet Oral Daily   Continuous Infusions: . sodium chloride    . cefTRIAXone (ROCEPHIN)  IV 2 g (10/01/20 1819)     LOS: 5 days       Phillips Climes, MD [facility]   To contact the attending provider between 7A-7P or the covering provider during after hours 7P-7A, please log into the web site  [email or web address] and access using universal [place] password for that web site. If you do not have the password, please call the hospital operator.  10/02/2020, 3:08 PM

## 2020-10-03 ENCOUNTER — Telehealth: Payer: Self-pay | Admitting: Cardiology

## 2020-10-03 ENCOUNTER — Inpatient Hospital Stay (HOSPITAL_COMMUNITY): Payer: PPO

## 2020-10-03 DIAGNOSIS — D72829 Elevated white blood cell count, unspecified: Secondary | ICD-10-CM

## 2020-10-03 DIAGNOSIS — R1010 Upper abdominal pain, unspecified: Secondary | ICD-10-CM

## 2020-10-03 LAB — SURGICAL PATHOLOGY

## 2020-10-03 LAB — COMPREHENSIVE METABOLIC PANEL
ALT: 30 U/L (ref 0–44)
AST: 31 U/L (ref 15–41)
Albumin: 2.3 g/dL — ABNORMAL LOW (ref 3.5–5.0)
Alkaline Phosphatase: 77 U/L (ref 38–126)
Anion gap: 9 (ref 5–15)
BUN: 29 mg/dL — ABNORMAL HIGH (ref 8–23)
CO2: 26 mmol/L (ref 22–32)
Calcium: 8.9 mg/dL (ref 8.9–10.3)
Chloride: 103 mmol/L (ref 98–111)
Creatinine, Ser: 1.58 mg/dL — ABNORMAL HIGH (ref 0.61–1.24)
GFR, Estimated: 42 mL/min — ABNORMAL LOW (ref >60)
Glucose, Bld: 107 mg/dL — ABNORMAL HIGH (ref 70–99)
Potassium: 3.7 mmol/L (ref 3.5–5.1)
Sodium: 138 mmol/L (ref 135–145)
Total Bilirubin: 1 mg/dL (ref 0.3–1.2)
Total Protein: 5.3 g/dL — ABNORMAL LOW (ref 6.5–8.1)

## 2020-10-03 LAB — CBC
HCT: 40.6 % (ref 39.0–52.0)
Hemoglobin: 13.4 g/dL (ref 13.0–17.0)
MCH: 28.9 pg (ref 26.0–34.0)
MCHC: 33 g/dL (ref 30.0–36.0)
MCV: 87.5 fL (ref 80.0–100.0)
Platelets: 344 10*3/uL (ref 150–400)
RBC: 4.64 MIL/uL (ref 4.22–5.81)
RDW: 13.9 % (ref 11.5–15.5)
WBC: 16.8 10*3/uL — ABNORMAL HIGH (ref 4.0–10.5)
nRBC: 0 % (ref 0.0–0.2)

## 2020-10-03 LAB — PROCALCITONIN: Procalcitonin: 3.87 ng/mL

## 2020-10-03 MED ORDER — MELATONIN 3 MG PO TABS
3.0000 mg | ORAL_TABLET | Freq: Every evening | ORAL | Status: DC | PRN
  Administered 2020-10-03: 3 mg via ORAL
  Filled 2020-10-03: qty 1

## 2020-10-03 NOTE — Progress Notes (Signed)
    Progress Note   Subjective  He feels well. No abdominal pain. Some leakage around the tube yesterday however none overnight. Minimal drainage this am. Appetite improved. Family at bedside.    Objective  Vital signs in last 24 hours: Temp:  [98.6 F (37 C)-98.9 F (37.2 C)] 98.6 F (37 C) (05/10 0353) Pulse Rate:  [63-82] 63 (05/10 0353) Resp:  [18-21] 20 (05/10 0353) BP: (135-153)/(64-69) 153/69 (05/10 0353) SpO2:  [98 %-100 %] 100 % (05/10 0353) Last BM Date: 09/30/20  General: Alert, well-developed, in NAD Heart:  Regular rate and rhythm; no murmurs Chest: Clear to ascultation bilaterally Abdomen:  Soft, nontender and nondistended. Normal bowel sounds, without guarding, and without rebound.  Drain in right flank.  Extremities:  Without edema. Neurologic:  Alert and  oriented x4; grossly normal neurologically. Psych:  Alert and cooperative. Normal mood and affect.  Intake/Output from previous day: 05/09 0701 - 05/10 0700 In: -  Out: 345 [Urine:325; Drains:20] Intake/Output this shift: No intake/output data recorded.  Lab Results: Recent Labs    10/03/20 0038  WBC 16.8*  HGB 13.4  HCT 40.6  PLT 344   BMET Recent Labs    10/01/20 0127 10/03/20 0038  NA 135 138  K 4.3 3.7  CL 103 103  CO2 25 26  GLUCOSE 103* 107*  BUN 30* 29*  CREATININE 1.61* 1.58*  CALCIUM 9.4 8.9   LFT Recent Labs    10/03/20 0038  PROT 5.3*  ALBUMIN 2.3*  AST 31  ALT 30  ALKPHOS 77  BILITOT 1.0      Assessment & Recommendations   1. S/P lap subtotal cholecystectomy on 5/3. Bile leak treated with sphincterotomy, 10 fr, 7 cm plastic biliary stent, PD stent on 5/7. No abdominal pain or tenderness. Drainage serous and bilious. The drain output is decreasing. Stent appears to be functioning to treat the leak.     LOS: 6 days   Norberto Sorenson T. Fuller Plan MD 10/03/2020, 8:57 AM [phone]

## 2020-10-03 NOTE — TOC Transition Note (Signed)
Transition of Care [facility]) - CM/SW Discharge Note   Patient Details  Name: Juan Benjamin MRN: [id number] Date of Birth: May 30, 1930  Transition of Care [facility]) CM/SW Contact:  Carles Collet, RN Phone Number: 10/03/2020, 3:18 PM   Clinical Narrative:    Damaris Schooner w patient at bedside and niece over the phone. Rollator order for patient through [employer] to be delivered to room for DC  [facility] services arranged through [facility] for [facility] PT [place]. Niece states per cardiology patient will not DC on blood thinner, therefore Eliquis card not given at this time. Will review meds on DC and provide assistance as needed.        Barriers to Discharge: Continued Medical Work up   Patient Goals and CMS Choice Patient states their goals for this hospitalization and ongoing recovery are:: to go home CMS Medicare.gov Compare Post Acute Care list provided to:: Patient Choice offered to / list presented to : Patient  Discharge Placement                       Discharge Plan and Services   Discharge Planning Services: CM Consult Post Acute Care Choice: Home Health,Durable Medical Equipment          DME Arranged: Walker rolling with seat DME Agency: [employer] Date DME Agency Contacted: 10/03/20 Time DME Agency Contacted: [phone] Representative spoke with at DME Agency: [place]: [place]: [place] Date [place]: 10/03/20 Time [place]: 1518 Representative spoke with at [place]: [place] ([place]) Interventions     Readmission Risk Interventions No flowsheet data found.

## 2020-10-03 NOTE — Progress Notes (Signed)
PROGRESS NOTE    Juan Benjamin  WUJ:[id number] DOB: 03-13-1931 DOA: 09/24/2020 PCP: Aletha Halim., PA-C    Chief Complaint  Patient presents with  . Abdominal Pain    Brief admission Narrative:   Juan Benjamin is a 87 y.o. male with medical history significant of CAD, carotid artery dz s/p CEA, HTN.  Pt presents to the ED with c/o abd pain.  Pain onset 3 hours PTA.  This onset was 2h after eating a peanut butter and banana sandwich.  Pain unchanged since onset.  No movement.  Pain is in RUQ.  Pain is constant, moderate.  No N/V/D.  No urinary symptoms, no constipation, no CP. Work-up was significant for acute cholecystitis, as well as finding of new diagnosis of atrial fibrillation. Patient went for laparoscopic cholecystectomy 5/3,subtotal cholecystectomy of necrotic bladder, patient with continued significant biliary leak through JP drain, GI consulted, plan for ERCP 5/6, he had stent placement for bile leak, remains with significant output, plan for repeat ERCP with stent exchange 5/9, overnight he is with significant decrease in JP drain output from 500cc /24 hours> 25cc/24 hours, so ERCP was canceled.  Subjective:  No significant events overnight, had good night sleep, feels a lot comfortable after he moved his bowels yesterday, had decreased JP drain output.    Assessment & Plan:  acute cholecystitis - S/p laparoscopic subtotal cholecystectomy on 09/26/2020 by Dr. Bobbye Morton -No febrile at this moment and denies nausea or vomiting -LFTs downtrending -Tolerating soft diet -Continue as needed antiemetics and analgesics. -We will try to mobilize, PT/OT consulted, encouraged with incentive spirometer. -patient with significant bilious drainage through JP drain, concern for.  Leak, GI input appreciated, s/p ERCP 5/6, status post temporary plastic pancreatic stent, and sphincterectomy and biliary tree was swept and sludge was found .  -Patient remains with significant JP  drain output after stent insertion, initial plan to repeat ERCP and stent exchange has been canceled given significantly decreased JP drain output . -Leukocytosis significantly elevated at 16 K today, but patient is afebrile, denies any pain, with significantly diminished drain, will continue with IV Rocephin for now, monitor closely, general surgery if white blood cell count continue to trend up, then will consider CT to evaluate for undrained bile/fluid collection.   Hypertension -Overall stable and well-controlled -Continue current antihypertensive agents and follow vital signs.  New onset a-fib ([place]) -Appears to be paroxysmal in nature -Currently rate controlled -Appreciate cardiology recommendations, no need for anticoagulation given brief episode, he is currently NSR.  hyperlipidemia -Continue Crestor  hypothyroidism -Continue Synthroid  Transaminitis -Due to cholecystitis, trending down.   AAA -This is incidental finding on imaging good, vascular surgery input greatly appreciated, recommendation for outpatient follow-up for possible need for surgical repair  DVT prophylaxis: lovenox Code Status: Full code Family Communication: Niece at bedside. Disposition:   Status is: INPATIENT  Dispo: The patient is from: Home              Anticipated d/c is to: Home              Patient currently not medically stable for discharge;    Difficult to place patient no       Consultants:   General surgery  Cardiology  Vascular surgery  GI   Procedures:  Status post laparoscopic cholecystectomy 09/26/2020   Antimicrobials:  Rocephin      Objective: Vitals:   10/02/20 1500 10/02/20 2104 10/03/20 0353 10/03/20 1156  BP:  135/64 (!) 153/69 122/79  Pulse: 67 82 63 69  Resp: 18 (!) 21 20 18   Temp: 98.9 F (37.2 C) 98.7 F (37.1 C) 98.6 F (37 C) 98.8 F (37.1 C)  TempSrc: Oral Axillary Axillary Oral  SpO2: 98% 99% 100% 100%  Weight:      Height:         Intake/Output Summary (Last 24 hours) at 10/03/2020 1518 Last data filed at 10/03/2020 0900 Gross per 24 hour  Intake 240 ml  Output 345 ml  Net -105 ml   Filed Weights   09/24/20 1841 09/29/20 1527  Weight: 65.8 kg 65.8 kg    Examination:  Awake Alert, pleasant, easily distracted Symmetrical Chest wall movement, Good air movement bilaterally, CTAB RRR,No Gallops,Rubs or new Murmurs, No Parasternal Heave +ve B.Sounds, Abd Soft, no right upper quadrant tenderness today, no leaking around insertion site of the JP drain, no rebound - guarding or rigidity. No Cyanosis, Clubbing or edema, No new Rash or bruise      Data Reviewed: I have personally reviewed following labs and imaging studies  CBC: Recent Labs  Lab 09/28/20 0625 09/29/20 0127 09/30/20 0041 10/03/20 0038  WBC 13.2* 13.0* 8.7 16.8*  HGB 12.4* 14.1 14.4 13.4  HCT 37.6* 42.7 43.2 40.6  MCV 88.9 89.1 87.4 87.5  PLT 210 248 208 161    Basic Metabolic Panel: Recent Labs  Lab 09/28/20 0625 09/29/20 0127 09/30/20 0041 10/01/20 0127 10/03/20 0038  NA 136 135 134* 135 138  K 4.3 4.2 4.8 4.3 3.7  CL 103 104 101 103 103  CO2 24 23 22 25 26   GLUCOSE 98 108* 118* 103* 107*  BUN 29* 24* 25* 30* 29*  CREATININE 1.39* 1.37* 1.50* 1.61* 1.58*  CALCIUM 9.4 9.6 9.5 9.4 8.9    GFR: Estimated Creatinine Clearance: 30.7 mL/min (A) (by C-G formula based on SCr of 1.58 mg/dL (H)).  Liver Function Tests: Recent Labs  Lab 09/28/20 0625 09/29/20 0127 09/30/20 0041 10/01/20 0127 10/03/20 0038  AST 38 30 19 29 31   ALT 83* 63* 47* 43 30  ALKPHOS 87 92 82 79 77  BILITOT 0.8 0.6 1.0 0.6 1.0  PROT 5.3* 5.8* 5.5* 5.5* 5.3*  ALBUMIN 2.5* 2.7* 2.6* 2.6* 2.3*    CBG: No results for input(s): GLUCAP in the last 168 hours.   Recent Results (from the past 240 hour(s))  Resp Panel by RT-PCR (Flu A&B, Covid) Nasopharyngeal Swab     Status: None   Collection Time: 09/24/20  9:25 PM   Specimen: Nasopharyngeal Swab;  Nasopharyngeal(NP) swabs in vial transport medium  Result Value Ref Range Status   SARS Coronavirus 2 by RT PCR NEGATIVE NEGATIVE Final    Comment: (NOTE) SARS-CoV-2 target nucleic acids are NOT DETECTED.  The SARS-CoV-2 RNA is generally detectable in upper respiratory specimens during the acute phase of infection. The lowest concentration of SARS-CoV-2 viral copies this assay can detect is 138 copies/mL. A negative result does not preclude SARS-Cov-2 infection and should not be used as the sole basis for treatment or other patient management decisions. A negative result may occur with  improper specimen collection/handling, submission of specimen other than nasopharyngeal swab, presence of viral mutation(s) within the areas targeted by this assay, and inadequate number of viral copies(<138 copies/mL). A negative result must be combined with clinical observations, patient history, and epidemiological information. The expected result is Negative.  Fact Sheet for Patients:  EntrepreneurPulse.com.au  Fact Sheet for Healthcare Providers:  IncredibleEmployment.be  This test is no t  yet approved or cleared by the [place] and  has been authorized for detection and/or diagnosis of SARS-CoV-2 by FDA under an Emergency Use Authorization (EUA). This EUA will remain  in effect (meaning this test can be used) for the duration of the COVID-19 declaration under Section 564(b)(1) of the Act, 21 U.S.C.section 360bbb-3(b)(1), unless the authorization is terminated  or revoked sooner.       Influenza A by PCR NEGATIVE NEGATIVE Final   Influenza B by PCR NEGATIVE NEGATIVE Final    Comment: (NOTE) The Xpert Xpress SARS-CoV-2/FLU/RSV plus assay is intended as an aid in the diagnosis of influenza from Nasopharyngeal swab specimens and should not be used as a sole basis for treatment. Nasal washings and aspirates are unacceptable for Xpert Xpress  SARS-CoV-2/FLU/RSV testing.  Fact Sheet for Patients: EntrepreneurPulse.com.au  Fact Sheet for Healthcare Providers: IncredibleEmployment.be  This test is not yet approved or cleared by the [place] FDA and has been authorized for detection and/or diagnosis of SARS-CoV-2 by FDA under an Emergency Use Authorization (EUA). This EUA will remain in effect (meaning this test can be used) for the duration of the COVID-19 declaration under Section 564(b)(1) of the Act, 21 U.S.C. section 360bbb-3(b)(1), unless the authorization is terminated or revoked.  Performed at [facility], [address] [place], [place] [address]   Surgical PCR screen     Status: None   Collection Time: 09/25/20 11:30 PM   Specimen: Nasal Mucosa; Nasal Swab  Result Value Ref Range Status   MRSA, PCR NEGATIVE NEGATIVE Final   Staphylococcus aureus NEGATIVE NEGATIVE Final    Comment: (NOTE) The Xpert SA Assay (FDA approved for NASAL specimens in patients 4 years of age and older), is one component of a comprehensive surveillance program. It is not intended to diagnose infection nor to guide or monitor treatment. Performed at [facility], [place] [address], [place], [place] [address]      Radiology Studies: No results found.  Scheduled Meds: . amLODipine  10 mg Oral Daily  . aspirin EC  81 mg Oral QHS  . enoxaparin (LOVENOX) injection  40 mg Subcutaneous Q24H  . feeding supplement  237 mL Oral TID BM  . guaiFENesin  600 mg Oral BID  . levothyroxine  88 mcg Oral Q0600  . lidocaine  1 patch Transdermal Q24H  . multivitamin  1 tablet Oral Daily  . pantoprazole  40 mg Oral Daily  . propranolol  40 mg Oral QHS  . rosuvastatin  10 mg Oral QHS  . senna  2 tablet Oral Daily   Continuous Infusions: . sodium chloride    . cefTRIAXone (ROCEPHIN)  IV 2 g (10/02/20 1737)     LOS: 6 days       Phillips Climes, MD [facility]   To  contact the attending provider between 7A-7P or the covering provider during after hours 7P-7A, please log into the web site [email or web address] and access using universal [place] password for that web site. If you do not have the password, please call the hospital operator.  10/03/2020, 3:18 PM

## 2020-10-03 NOTE — Telephone Encounter (Signed)
Currently admitted.

## 2020-10-03 NOTE — Progress Notes (Signed)
Physical Therapy Treatment Patient Details Name: Juan Benjamin MRN: [id number] DOB: [birth year] Today's Date: 10/03/2020    History of Present Illness Pt is an 87 year old mand admitted with abdominal pain, + acute cholecystitis. Underwent subtotal laproscopic cholecystectomy on 09/26/20 with drain placement. Hospital course complicated by new onset afib. PMH: CAD s/p CABG, CEA, HTN, prostate ca, melanoma, hypothyroidism, HLD.    PT Comments    Pt did well with the rollator and prefers it to a rolling walker. Recommend rollator for home as well as HHPT and [place] aide. Hopeful that with therapy at home pt will return to independent mobility without any device. Niece is also interested in getting pt Life Alert system.    Follow Up Recommendations  Home health PT;Supervision - Intermittent     Equipment Recommendations  Other (comment) (rollator)    Recommendations for Other Services       Precautions / Restrictions Precautions Precautions: Other (comment) Precaution Comments: R side JP drain    Mobility  Bed Mobility               General bed mobility comments: Pt up in chair    Transfers Overall transfer level: Modified independent Equipment used: 4-wheeled walker Transfers: Sit to/from Stand Sit to Stand: Modified independent (Device/Increase time)            Ambulation/Gait Ambulation/Gait assistance: Supervision;Min guard Gait Distance (Feet): 700 Feet Assistive device: 4-wheeled walker;None Gait Pattern/deviations: Step-through pattern;Decreased stride length;Drifts right/left Gait velocity: decr Gait velocity interpretation: 1.31 - 2.62 ft/sec, indicative of limited community ambulator General Gait Details: Supervision for lines when using rollator with pt using rollator correctly. Without device pt min guard for safety due to drifting rt and lt.   Stairs             Wheelchair Mobility    Modified Rankin (Stroke Patients Only)        Balance Overall balance assessment: Needs assistance Sitting-balance support: No upper extremity supported;Feet supported Sitting balance-Leahy Scale: Normal     Standing balance support: No upper extremity supported Standing balance-Leahy Scale: Fair                              Cognition Arousal/Alertness: Awake/alert Behavior During Therapy: WFL for tasks assessed/performed Overall Cognitive Status: Impaired/Different from baseline Area of Impairment: Memory                     Memory: Decreased short-term memory         General Comments: Niece reports his night time confusion has improved but he is still having some confusion and hallucinations      Exercises      General Comments General comments (skin integrity, edema, etc.): VSS on RA. Niece present      Pertinent Vitals/Pain Pain Assessment: No/denies pain    Home Living                      Prior Function            PT Goals (current goals can now be found in the care plan section) Acute Rehab PT Goals Patient Stated Goal: return home Progress towards PT goals: Progressing toward goals    Frequency    Min 3X/week      PT Plan Current plan remains appropriate    Co-evaluation  AM-PAC PT "6 Clicks" Mobility   Outcome Measure  Help needed turning from your back to your side while in a flat bed without using bedrails?: None Help needed moving from lying on your back to sitting on the side of a flat bed without using bedrails?: None Help needed moving to and from a bed to a chair (including a wheelchair)?: A Little Help needed standing up from a chair using your arms (e.g., wheelchair or bedside chair)?: None Help needed to walk in hospital room?: A Little Help needed climbing 3-5 steps with a railing? : A Little 6 Click Score: 21    End of Session Equipment Utilized During Treatment: Gait belt Activity Tolerance: Patient tolerated treatment  well Patient left: in chair;with call bell/phone within reach;with family/visitor present Nurse Communication: Mobility status PT Visit Diagnosis: Unsteadiness on feet (R26.81)     Time: 7858-8502 PT Time Calculation (min) (ACUTE ONLY): 21 min  Charges:  $Gait Training: 8-22 mins                     [place] Pager [phone] Office [place] 10/03/2020, 10:43 AM

## 2020-10-03 NOTE — Progress Notes (Signed)
Progress Note  Patient Name: Juan Benjamin Date of Encounter: 10/03/2020  Primary Cardiologist: Minus Breeding, MD  Subjective   Looks good eating breakfast Did not need ERCP  Inpatient Medications    Scheduled Meds: . amLODipine  10 mg Oral Daily  . aspirin EC  81 mg Oral QHS  . enoxaparin (LOVENOX) injection  40 mg Subcutaneous Q24H  . feeding supplement  237 mL Oral TID BM  . guaiFENesin  600 mg Oral BID  . levothyroxine  88 mcg Oral Q0600  . lidocaine  1 patch Transdermal Q24H  . multivitamin  1 tablet Oral Daily  . pantoprazole  40 mg Oral Daily  . propranolol  40 mg Oral QHS  . rosuvastatin  10 mg Oral QHS  . senna  2 tablet Oral Daily   Continuous Infusions: . sodium chloride    . cefTRIAXone (ROCEPHIN)  IV 2 g (10/02/20 1737)   PRN Meds: sodium chloride, acetaminophen **OR** acetaminophen, haloperidol lactate, hydrALAZINE, melatonin, morphine injection, ondansetron **OR** ondansetron (ZOFRAN) IV, traMADol   Vital Signs    Vitals:   10/02/20 0507 10/02/20 1500 10/02/20 2104 10/03/20 0353  BP: 140/62  135/64 (!) 153/69  Pulse: (!) 55 67 82 63  Resp: 17 18 (!) 21 20  Temp: 97.9 F (36.6 C) 98.9 F (37.2 C) 98.7 F (37.1 C) 98.6 F (37 C)  TempSrc: Oral Oral Axillary Axillary  SpO2: 97% 98% 99% 100%  Weight:      Height:        Intake/Output Summary (Last 24 hours) at 10/03/2020 0814 Last data filed at 10/03/2020 0400 Gross per 24 hour  Intake --  Output 345 ml  Net -345 ml   Last 3 Weights 09/29/2020 09/24/2020 03/21/2020  Weight (lbs) 145 lb 1 oz 145 lb 147 lb 0.8 oz  Weight (kg) 65.8 kg 65.772 kg 66.7 kg     Telemetry    NSR rates 60's 10/03/2020   Physical Exam   Affect appropriate Elderly white male  HEENT: normal Neck supple with no adenopathy JVP normal no bruits no thyromegaly Lungs clear with no wheezing and good diaphragmatic motion Heart:  S1/S2 AS  murmur, no rub, gallop or click PMI normal Abdomen: benighn, JP drain site  looks fine  Distal pulses intact with no bruits No edema Neuro non-focal Skin warm and dry No muscular weakness   Labs    High Sensitivity Troponin:   Recent Labs  Lab 09/24/20 1932 09/24/20 2125  TROPONINIHS 9 12      Cardiac EnzymesNo results for input(s): TROPONINI in the last 168 hours. No results for input(s): TROPIPOC in the last 168 hours.   Chemistry Recent Labs  Lab 09/30/20 0041 10/01/20 0127 10/03/20 0038  NA 134* 135 138  K 4.8 4.3 3.7  CL 101 103 103  CO2 22 25 26   GLUCOSE 118* 103* 107*  BUN 25* 30* 29*  CREATININE 1.50* 1.61* 1.58*  CALCIUM 9.5 9.4 8.9  PROT 5.5* 5.5* 5.3*  ALBUMIN 2.6* 2.6* 2.3*  AST 19 29 31   ALT 47* 43 30  ALKPHOS 82 79 77  BILITOT 1.0 0.6 1.0  GFRNONAA 45* 41* 42*  ANIONGAP 11 7 9      Hematology Recent Labs  Lab 09/29/20 0127 09/30/20 0041 10/03/20 0038  WBC 13.0* 8.7 16.8*  RBC 4.79 4.94 4.64  HGB 14.1 14.4 13.4  HCT 42.7 43.2 40.6  MCV 89.1 87.4 87.5  MCH 29.4 29.1 28.9  MCHC 33.0 33.3 33.0  RDW 14.0 13.6 13.9  PLT 248 208 344    BNPNo results for input(s): BNP, PROBNP in the last 168 hours.   DDimer No results for input(s): DDIMER in the last 168 hours.   Radiology    No results found.  Cardiac Studies   2D Echo 09/25/20 1. Left ventricular ejection fraction, by estimation, is 55%. The left  ventricle has normal function. The left ventricle has no regional wall  motion abnormalities. Left ventricular diastolic parameters are consistent  with Grade I diastolic dysfunction  (impaired relaxation).  2. Right ventricular systolic function is mildly reduced. The right  ventricular size is normal. Tricuspid regurgitation signal is inadequate  for assessing PA pressure.  3. Left atrial size was mildly dilated.  4. Right atrial size was mildly dilated.  5. The mitral valve is normal in structure. Trivial mitral valve  regurgitation. No evidence of mitral stenosis.  6. The aortic valve is tricuspid.  Aortic valve regurgitation is trivial.  Moderate aortic valve stenosis. Aortic valve mean gradient measures 29.0  mmHg, AVA 1.07 cm^2.  7. Aortic dilatation noted. There is mild dilatation of the aortic root,  measuring 38 mm.   Patient Profile     87 y.o. 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 For ERCP. Noted brief PAF during admission    Assessment & Plan    1. Acute cholecystitis s/p laparoscopicsubtotal cholecystectomy 09/26/20 - with JP drain placement site looks fine to me. Plan to d/c home with tube in place   2. Paroxysmal atrial fib - NSR given brief nature would not anticoagulate in need further procedures for GB as well as left iliac aneurysm Discussed with niece   3. CAD s/p CABG - on ASA, BB, statin at baseline, no angina reported  4. PAD - historically has had carotid disease followed by Dr. Percival Spanish, - now with AAA and iliac aneurysm outpatient f/u Dr Trula Slade [facility] for stent Graft at some point   5. Moderate aortic stenosis - updated echo 09/25/20 with moderate AS - asymptomatic   6. CKD stage IIIa - Cr appears near baseline 1,6   7. Essential HTN - Well controlled.  Continue current medications and low sodium Dash type diet.    Discussed care with niece  Will arrange outpatient monitor and cardiology f/u  Ok to d/c home with no anticoagulation   For questions or updates, please contact [place] Please consult [email or web address] for contact info under Cardiology/STEMI.  Signed, Jenkins Rouge, MD 10/03/2020, 8:14 AM

## 2020-10-03 NOTE — Telephone Encounter (Signed)
TOC per Dr. Johnsie Cancel scheduled for 10/17/20 at 1:40pm with Dr. Percival Spanish.

## 2020-10-03 NOTE — Progress Notes (Signed)
4 Days Post-Op  Subjective: CC: No leakage of JP drain site last night.  Having some swelling around JP drain site however.  No abdominal pain.  Tolerating diet and eating well without nausea or vomiting.  Had a better appetite yesterday.  Last BM 5/7.  Objective: Vital signs in last 24 hours: Temp:  [98.6 F (37 C)-98.9 F (37.2 C)] 98.6 F (37 C) (05/10 0353) Pulse Rate:  [63-82] 63 (05/10 0353) Resp:  [18-21] 20 (05/10 0353) BP: (135-153)/(64-69) 153/69 (05/10 0353) SpO2:  [98 %-100 %] 100 % (05/10 0353) Last BM Date: 09/30/20  Intake/Output from previous day: 05/09 0701 - 05/10 0700 In: -  Out: 345 [Urine:325; Drains:20] Intake/Output this shift: No intake/output data recorded.  PE: Gen: Alert, NAD, pleasant Pulm: normal rate and effort Abd: Soft,ND, essentially NT w/ very mild and appropriate tenderness around laparoscopic incisions. No RUQ tenderness.No peritonitis. +BS. Drain with more transparent mixed SS and bilious output.Drain tubing stripped.No leakage around the drain and dressing c/d/i. Mild swelling around the drain but no surrounding induration, erythema or signs of cellulitis. Incisions with glue intact appears well and are without drainage, bleeding, or signs of infection Ext: No LE edema Skin: no rashes noted, warm and dry  Lab Results:  Recent Labs    10/03/20 0038  WBC 16.8*  HGB 13.4  HCT 40.6  PLT 344   BMET Recent Labs    10/01/20 0127 10/03/20 0038  NA 135 138  K 4.3 3.7  CL 103 103  CO2 25 26  GLUCOSE 103* 107*  BUN 30* 29*  CREATININE 1.61* 1.58*  CALCIUM 9.4 8.9   PT/INR No results for input(s): LABPROT, INR in the last 72 hours. CMP     Component Value Date/Time   NA 138 10/03/2020 0038   K 3.7 10/03/2020 0038   CL 103 10/03/2020 0038   CO2 26 10/03/2020 0038   GLUCOSE 107 (H) 10/03/2020 0038   BUN 29 (H) 10/03/2020 0038   CREATININE 1.58 (H) 10/03/2020 0038   CALCIUM 8.9 10/03/2020 0038   PROT 5.3 (L)  10/03/2020 0038   ALBUMIN 2.3 (L) 10/03/2020 0038   AST 31 10/03/2020 0038   ALT 30 10/03/2020 0038   ALKPHOS 77 10/03/2020 0038   BILITOT 1.0 10/03/2020 0038   GFRNONAA 42 (L) 10/03/2020 0038   GFRAA (L) 11/08/2008 0420    52        The eGFR has been calculated using the MDRD equation. This calculation has not been validated in all clinical situations. eGFR's persistently <60 mL/min signify possible Chronic Kidney Disease.   Lipase     Component Value Date/Time   LIPASE 42 09/24/2020 1932       Studies/Results: No results found.  Anti-infectives: Anti-infectives (From admission, onward)   Start     Dose/Rate Route Frequency Ordered Stop   09/25/20 1730  cefTRIAXone (ROCEPHIN) 2 g in sodium chloride 0.9 % 100 mL IVPB        2 g 200 mL/hr over 30 Minutes Intravenous Every 24 hours 09/25/20 1634         Assessment/Plan CAD HTN HLD Hypothyroidism Hiatal hernia noted on ERCP Gastritis noted on ERCP - on PPI CKD - Cr 1.58 Acute delirium - suspect somewhat due to sundowning, try to limit narcotics as able. No confusion noted overnight or this am  Acute cholecystitis S/P laparoscopicsubtotal cholecystectomy 09/26/20 Dr. Bobbye Morton - POD#7 -Bile Leak - s/p ERCP andplasticstenting by Dr. Rush Landmark 5/6.Initially planned to have  repeat ERCP for self-expanding metal stent placement on 5/9 but pain resolved and output decreased. Drain with more transparent mixed SS and bilious output. Output down.  -LFT'snormalized.  - WBC 8.7 > 16.8. Afebrile and NT on exam, however will monitor closely. If trends up tomorrow will consider CT to evaluate for undrained bile/fluid collection - Cont IV abx  - continue to mobilize. PT rec [facility]  [place] MBE:MLJQ, lovenox ID: [email or web address] 5/2>>   LOS: 6 days    Jillyn Ledger , [facility] Surgery 10/03/2020, 8:51 AM Please see Amion for pager number during day hours 7:00am-4:30pm

## 2020-10-03 NOTE — Progress Notes (Signed)
Overnight, The swelling at the patients JP drain sight appears to have increased.  Drainage remains low with no leakage noted throughout the night.    Hospitalist on call was contacted for concerns of the drain leaking internally due to increased swelling.  Advised to contact surgery team to evaluate drain. Surgery paged.  Pt states he has no pain and his abdomen is nontender.

## 2020-10-04 ENCOUNTER — Encounter: Payer: Self-pay | Admitting: Gastroenterology

## 2020-10-04 ENCOUNTER — Other Ambulatory Visit (HOSPITAL_COMMUNITY): Payer: Self-pay

## 2020-10-04 LAB — COMPREHENSIVE METABOLIC PANEL
ALT: 32 U/L (ref 0–44)
AST: 36 U/L (ref 15–41)
Albumin: 2.5 g/dL — ABNORMAL LOW (ref 3.5–5.0)
Alkaline Phosphatase: 82 U/L (ref 38–126)
Anion gap: 12 (ref 5–15)
BUN: 26 mg/dL — ABNORMAL HIGH (ref 8–23)
CO2: 24 mmol/L (ref 22–32)
Calcium: 9.2 mg/dL (ref 8.9–10.3)
Chloride: 105 mmol/L (ref 98–111)
Creatinine, Ser: 1.47 mg/dL — ABNORMAL HIGH (ref 0.61–1.24)
GFR, Estimated: 46 mL/min — ABNORMAL LOW (ref >60)
Glucose, Bld: 89 mg/dL (ref 70–99)
Potassium: 3.9 mmol/L (ref 3.5–5.1)
Sodium: 141 mmol/L (ref 135–145)
Total Bilirubin: 0.6 mg/dL (ref 0.3–1.2)
Total Protein: 5.4 g/dL — ABNORMAL LOW (ref 6.5–8.1)

## 2020-10-04 LAB — CBC
HCT: 41 % (ref 39.0–52.0)
Hemoglobin: 13.5 g/dL (ref 13.0–17.0)
MCH: 28.8 pg (ref 26.0–34.0)
MCHC: 32.9 g/dL (ref 30.0–36.0)
MCV: 87.4 fL (ref 80.0–100.0)
Platelets: 355 10*3/uL (ref 150–400)
RBC: 4.69 MIL/uL (ref 4.22–5.81)
RDW: 13.9 % (ref 11.5–15.5)
WBC: 15.1 10*3/uL — ABNORMAL HIGH (ref 4.0–10.5)
nRBC: 0 % (ref 0.0–0.2)

## 2020-10-04 LAB — TOTAL BILIRUBIN, BODY FLUID: Total bilirubin, fluid: 19.8 mg/dL

## 2020-10-04 MED ORDER — FLUCONAZOLE 100 MG PO TABS
100.0000 mg | ORAL_TABLET | Freq: Every day | ORAL | Status: DC
  Administered 2020-10-04: 100 mg via ORAL
  Filled 2020-10-04 (×2): qty 1

## 2020-10-04 MED ORDER — AMLODIPINE BESYLATE 5 MG PO TABS
10.0000 mg | ORAL_TABLET | Freq: Every day | ORAL | 0 refills | Status: DC
  Filled 2020-10-04: qty 60, 30d supply, fill #0

## 2020-10-04 MED ORDER — HALOPERIDOL LACTATE 5 MG/ML IJ SOLN: 2.0000 mg | Freq: Four times a day (QID) | INTRAMUSCULAR | Status: DC | PRN

## 2020-10-04 MED ORDER — FLUCONAZOLE 100 MG PO TABS
100.0000 mg | ORAL_TABLET | Freq: Every day | ORAL | 0 refills | Status: DC
  Filled 2020-10-04: qty 5, 5d supply, fill #0

## 2020-10-04 NOTE — Progress Notes (Signed)
[facility] Progress Note  5 Days Post-Op  Subjective: CC-  Up in chair, niece at bedside.  Feeling well this morning. Does not eat much but tolerating diet. Likes vanilla Ensure. Denies n/v. BM yesterday. CT yesterday showed 6.1 x 2.4 x 3.3 cm fluid collection in the gallbladder fossa with JP drain in position. WBC down 15.1, afebrile. LFTs WNL  Objective: Vital signs in last 24 hours: Temp:  [98.7 F (37.1 C)-98.9 F (37.2 C)] 98.9 F (37.2 C) (05/11 0411) Pulse Rate:  [65-77] 65 (05/11 0411) Resp:  [18-20] 20 (05/11 0411) BP: (122-126)/(56-79) 126/56 (05/11 0411) SpO2:  [93 %-100 %] 93 % (05/11 0411) Last BM Date: 09/30/20  Intake/Output from previous day: 05/10 0701 - 05/11 0700 In: 240 [P.O.:240] Out: 30 [Drains:30] Intake/Output this shift: No intake/output data recorded.  PE: Gen: Alert, NAD, pleasant Pulm: normal rate and effort Abd: Soft,ND,NT, +BS. Drain with more transparent mixed SS and bilious output.Mild swelling around the drain but nosurroundinginduration, erythema or signs of cellulitis. Incisions with glue intact appears well and are without drainage, bleeding, or signs of infection Ext: No LE edema Skin: no rashes noted, warm and dry   Lab Results:  Recent Labs    10/03/20 0038 10/04/20 0141  WBC 16.8* 15.1*  HGB 13.4 13.5  HCT 40.6 41.0  PLT 344 355   BMET Recent Labs    10/03/20 0038 10/04/20 0141  NA 138 141  K 3.7 3.9  CL 103 105  CO2 26 24  GLUCOSE 107* 89  BUN 29* 26*  CREATININE 1.58* 1.47*  CALCIUM 8.9 9.2   PT/INR No results for input(s): LABPROT, INR in the last 72 hours. CMP     Component Value Date/Time   NA 141 10/04/2020 0141   K 3.9 10/04/2020 0141   CL 105 10/04/2020 0141   CO2 24 10/04/2020 0141   GLUCOSE 89 10/04/2020 0141   BUN 26 (H) 10/04/2020 0141   CREATININE 1.47 (H) 10/04/2020 0141   CALCIUM 9.2 10/04/2020 0141   PROT 5.4 (L) 10/04/2020 0141   ALBUMIN 2.5 (L) 10/04/2020 0141    AST 36 10/04/2020 0141   ALT 32 10/04/2020 0141   ALKPHOS 82 10/04/2020 0141   BILITOT 0.6 10/04/2020 0141   GFRNONAA 46 (L) 10/04/2020 0141   GFRAA (L) 11/08/2008 0420    52        The eGFR has been calculated using the MDRD equation. This calculation has not been validated in all clinical situations. eGFR's persistently <60 mL/min signify possible Chronic Kidney Disease.   Lipase     Component Value Date/Time   LIPASE 42 09/24/2020 1932       Studies/Results: CT ABDOMEN PELVIS WO CONTRAST  Result Date: 10/03/2020 CLINICAL DATA:  Status post cholecystectomy 09/26/2020. EXAM: CT ABDOMEN AND PELVIS WITHOUT CONTRAST TECHNIQUE: Multidetector CT imaging of the abdomen and pelvis was performed following the standard protocol without IV contrast. COMPARISON:  09/24/2020 FINDINGS: Lower chest: Minimal dependent atelectasis in the lung bases. Hepatobiliary: No focal abnormality in the liver on this study without intravenous contrast. Fluid collection identified in the gallbladder fossa measuring approximately 6.1 x 2.4 x 3.3 cm. The JP drain courses through this collection. Common bile duct stent visualized in situ without intra or extrahepatic biliary duct dilatation. Pancreas: Pancreatic duct stent identified with distal loop formed in the duodenum. No main duct dilatation. Spleen: No splenomegaly. No focal mass lesion. Adrenals/Urinary Tract: No adrenal nodule or mass. Right kidney is atrophic with multiple  cysts evident. Central sinus cysts in both kidneys were better demonstrated on the previous CT scan performed with intravenous contrast material. No evidence for hydroureter. The urinary bladder appears normal for the degree of distention. Stomach/Bowel: Stomach is unremarkable. No gastric wall thickening. No evidence of outlet obstruction. Duodenum is normally positioned as is the ligament of Treitz. No small bowel wall thickening. No small bowel dilatation. The terminal ileum is normal.  The appendix is normal. No gross colonic mass. No colonic wall thickening. Vascular/Lymphatic: There is abdominal aortic atherosclerosis without aneurysm. Distal abdominal aorta is dilated up to 3.6 cm with aneurysmal dilatation of the left common iliac artery at 3.7 cm. Right common iliac artery measures 1.8 cm diameter. There is no gastrohepatic or hepatoduodenal ligament lymphadenopathy. No retroperitoneal or mesenteric lymphadenopathy. No pelvic sidewall lymphadenopathy. Reproductive: The prostate gland and seminal vesicles are unremarkable. Other: No intraperitoneal free fluid. Intraperitoneal free air scattered in the abdomen is not unexpected on postoperative day 7 Musculoskeletal: No worrisome lytic or sclerotic osseous abnormality. IMPRESSION: 1. 6.1 x 2.4 x 3.3 cm fluid collection in the gallbladder fossa status post cholecystectomy with JP-drain placement. JP drain is position within this collection. 2. Common bile duct and pancreatic duct stents in situ without intra or extrahepatic biliary duct dilatation. 3. Atrophic right kidney with multiple bilateral renal cysts. 4. 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 5. Aortic Atherosclerosis (ICD10-I70.0). Electronically Signed   By: Misty Stanley M.D.   On: 10/03/2020 20:04    Anti-infectives: Anti-infectives (From admission, onward)   Start     Dose/Rate Route Frequency Ordered Stop   09/25/20 1730  cefTRIAXone (ROCEPHIN) 2 g in sodium chloride 0.9 % 100 mL IVPB        2 g 200 mL/hr over 30 Minutes Intravenous Every 24 hours 09/25/20 1634         Assessment/Plan CAD HTN HLD Hypothyroidism Hiatal hernia noted on ERCP Gastritis noted on ERCP - on PPI CKD - Cr 1.47 Acute delirium - suspect somewhat due to sundowning, try to limit narcotics as able. No confusion noted overnight or this am  Acute cholecystitis S/P laparoscopicsubtotal cholecystectomy 09/26/20 Dr. Bobbye Morton - POD#8 -Bile Leak - s/p ERCP andplasticstenting  by Dr. Rush Landmark 5/6.Initially planned to have repeat ERCP forself-expanding metal stent placement on 5/9 but pain resolved and output decreased. Drain with more transparent mixed SS and bilious output. Output down (30cc/ 24 hr).  -LFT'snormalized.  - WBC 16.8 > 15.1. Afebrile and NT on exam - CT 5/10 shows 6.1 x 2.4 x 3.3 cm fluid collection in the gallbladder fossa with JP drain in position - JP drain output has decreased in volume and is becoming more transparent   FEN:HH PJS:RPRX, lovenox ID: rocephin 5/2>>  Plan: Patient ok for discharge today from surgical standpoint. He will go home with JP drain. He does not need any more antibiotics from surgical or GI (confirmed with Dr. Fuller Plan) standpoint. Discharge instructions and follow up info on AVS.   LOS: 7 days    Wellington Hampshire, [facility] Surgery 10/04/2020, 8:55 AM Please see Amion for pager number during day hours 7:00am-4:30pm

## 2020-10-04 NOTE — Progress Notes (Signed)
Patient was discharged from [phone]. All belongings were given to patient's niece and AVS reviewed. Walker delivered to room at a prior date and was taken with patient. TOC meds delivered to room. Telemetry and IV d/c'd. VS stable. Education provided to patient's niece regarding emptying drain and changing dressing. Patient will be taken downstairs via wheelchair.

## 2020-10-04 NOTE — Discharge Summary (Signed)
PATIENT DETAILS Name: Juan Benjamin Age: 87 y.o. Sex: male Date of Birth: 01-14-1931 MRN: [id number]. Admitting Physician: Albertine Patricia, MD DXI:[id number], Baldemar Friday., PA-C  Admit Date: 09/24/2020 Discharge date: 10/04/2020  Recommendations for Outpatient Follow-up:  1. Follow up with PCP in 1-2 weeks 2. Please obtain CMP/CBC in one week 3. Please ensure follow-up with general surgery, gastroenterology, cardiology  Admitted From:  Home  Disposition: Home with home health services   Home Health: Yes  Equipment/Devices: None  Discharge Condition: Stable  CODE STATUS: FULL CODE  Diet recommendation:  Diet Order            Diet Heart Room service appropriate? Yes; Fluid consistency: Thin  Diet effective now                  Brief Summary: See H&P, Labs, Consult and Test reports for all details in brief, patient is a 87 year old male with history of CAD, carotid artery disease-s/p CEA, HTN who presented with acute cholecystitis-underwent laparoscopic cholecystectomy-subsequently hospital course complicated by biliary leak-underwent ERCP and stent placement.  See below for further details.   Brief Hospital Course: Acute cholecystitis: Treated with IV antibiotics-s/p laparoscopic subtotal cholecystectomy-subsequently complicated by biliary leak requiring ERCP and stent placement.  GI and general surgery followed closely.  Evaluated by both GI and general surgery on day of discharge-okay for discharge-does not require any further antimicrobial therapy.  GI and general surgery will arrange for post hospital follow-up.  PAF: Evaluated by cardiology-given brief nature of A. fib-does not need anticoagulation.  Cardiology discussed with niece.  Oral candidiasis: We will discharge on Diflucan.  CKD stage IIIa: Creatinine close to baseline-follow electrolytes periodically.  CAD: History of CABG-no anginal symptoms-continue aspirin/beta-blocker/statin  PAD:  Stable-follow-up with cardiology and vascular surgery.  Moderate aortic stenosis: Asymptomatic-stable for outpatient follow-up with cardiology  HTN: BP stable-continue amlodipine, propanolol.  Benazepril remains on hold-reassess at next visit with PCP.  Hypothyroidism: Continue Synthroid  Procedures 5/3>> cholecystectomy 5/6>> ERCP with stent placement  Discharge Diagnoses:  Principal Problem:   Abdominal pain Active Problems:   Hypertension   New onset a-fib ([place])   Acute cholecystitis   Gallbladder sludge   Bile leak   Discharge Instructions:  Activity:  As tolerated with Full fall precautions use walker/cane & assistance as needed   Allergies as of 10/04/2020      Reactions   Wasp Venom Anaphylaxis   Bee Venom    Crestor [rosuvastatin]    myalgia   Gemfibrozil Other (See Comments)   unknown   Indomethacin Other (See Comments)   unknown   Lipitor [atorvastatin]    myalgia   Lisinopril Cough   Rofecoxib Other (See Comments)   unknown   Sildenafil Other (See Comments)   unknown   Simvastatin Other (See Comments)   unknown      Medication List    STOP taking these medications   benazepril 10 MG tablet Commonly known as: LOTENSIN   ibuprofen 200 MG tablet Commonly known as: ADVIL   oxyCODONE 5 MG immediate release tablet Commonly known as: Oxy IR/ROXICODONE     TAKE these medications   acetaminophen 325 MG tablet Commonly known as: TYLENOL Take 325-650 mg by mouth every 6 (six) hours as needed for moderate pain (ear pain).   allopurinol 100 MG tablet Commonly known as: ZYLOPRIM Take 100 mg by mouth at bedtime.   amLODipine 5 MG tablet Commonly known as: NORVASC Take 2 tablets (10 mg total) by mouth daily.  What changed: how much to take   aspirin EC 81 MG tablet Take 1 tablet (81 mg total) by mouth daily. What changed: when to take this   EPINEPHrine 0.3 mg/0.3 mL Soaj injection Commonly known as: EPI-PEN Inject 0.3 mLs (0.3 mg total)  into the muscle as needed for anaphylaxis.   fluconazole 100 MG tablet Commonly known as: DIFLUCAN Take 1 tablet (100 mg total) by mouth daily. Start taking on: Oct 05, 2020   levothyroxine 88 MCG tablet Commonly known as: SYNTHROID   multivitamin-lutein Caps capsule Take 1 capsule by mouth daily.   propranolol 40 MG tablet Commonly known as: INDERAL Take 40 mg by mouth at bedtime.   rosuvastatin 20 MG tablet Commonly known as: CRESTOR Take 1 tablet (20 mg total) by mouth daily. What changed:   how much to take  when to take this   vitamin B-12 1000 MCG tablet Commonly known as: CYANOCOBALAMIN Take 1,000 mcg by mouth daily.   Vitamin D-3 125 MCG (5000 UT) Tabs Take 5,000 Units by mouth daily.   zinc oxide 11.3 % Crea cream Commonly known as: BALMEX Apply 1 application topically daily as needed (pressure spot).            Durable Medical Equipment  (From admission, onward)         Start     Ordered   10/03/20 1137  For home use only DME 4 wheeled rolling walker with seat  Once       Question:  Patient needs a walker to treat with the following condition  Answer:  Status post surgery   10/03/20 1136          Follow-up Information    Jesusita Oka, MD. Go on 10/11/2020.   Specialty: Surgery Why: Your appointment is 5/18 at 1:40pm Please arrive 30 minutes prior to your appointment to check in and fill out paperwork. Bring photo ID and insurance information. Contact information: [place] [address] [phone]        [facility] Follow up.   Specialty: Home Health Services Why: for home health services. They will contact you 1-2 after discharge to schedule first home visit.  Contact information: [place] [address] [phone]        Mansouraty, Telford Nab., MD. Call in 1 week(s).   Specialties: Gastroenterology, Internal Medicine Contact information: [place] [address] [phone]        Aletha Halim., PA-C. Schedule an appointment as soon as possible for a visit in 1 week(s).   Specialty: Family Medicine Contact information: [address] [place]  [address] [phone]        Minus Breeding, MD. Schedule an appointment as soon as possible for a visit in 2 week(s).   Specialty: Cardiology Contact information: [address] [place] [address] [phone]              Allergies  Allergen Reactions  . Wasp Venom Anaphylaxis  . Bee Venom   . Crestor [Rosuvastatin]     myalgia  . Gemfibrozil Other (See Comments)    unknown  . Indomethacin Other (See Comments)    unknown  . Lipitor [Atorvastatin]     myalgia  . Lisinopril Cough  . Rofecoxib Other (See Comments)    unknown  . Sildenafil Other (See Comments)    unknown  . Simvastatin Other (See Comments)  unknown     Consultations:   cardiology, GI and general surgery     Other Procedures/Studies: CT ABDOMEN PELVIS WO CONTRAST  Result Date: 10/03/2020 CLINICAL DATA:  Status post cholecystectomy 09/26/2020. EXAM: CT ABDOMEN AND PELVIS WITHOUT CONTRAST TECHNIQUE: Multidetector CT imaging of the abdomen and pelvis was performed following the standard protocol without IV contrast. COMPARISON:  09/24/2020 FINDINGS: Lower chest: Minimal dependent atelectasis in the lung bases. Hepatobiliary: No focal abnormality in the liver on this study without intravenous contrast. Fluid collection identified in the gallbladder fossa measuring approximately 6.1 x 2.4 x 3.3 cm. The JP drain courses through this collection. Common bile duct stent visualized in situ without intra or extrahepatic biliary duct dilatation. Pancreas: Pancreatic duct stent identified with distal loop formed in the duodenum. No main duct dilatation. Spleen: No splenomegaly. No focal mass lesion. Adrenals/Urinary Tract: No adrenal nodule or mass. Right kidney is  atrophic with multiple cysts evident. Central sinus cysts in both kidneys were better demonstrated on the previous CT scan performed with intravenous contrast material. No evidence for hydroureter. The urinary bladder appears normal for the degree of distention. Stomach/Bowel: Stomach is unremarkable. No gastric wall thickening. No evidence of outlet obstruction. Duodenum is normally positioned as is the ligament of Treitz. No small bowel wall thickening. No small bowel dilatation. The terminal ileum is normal. The appendix is normal. No gross colonic mass. No colonic wall thickening. Vascular/Lymphatic: There is abdominal aortic atherosclerosis without aneurysm. Distal abdominal aorta is dilated up to 3.6 cm with aneurysmal dilatation of the left common iliac artery at 3.7 cm. Right common iliac artery measures 1.8 cm diameter. There is no gastrohepatic or hepatoduodenal ligament lymphadenopathy. No retroperitoneal or mesenteric lymphadenopathy. No pelvic sidewall lymphadenopathy. Reproductive: The prostate gland and seminal vesicles are unremarkable. Other: No intraperitoneal free fluid. Intraperitoneal free air scattered in the abdomen is not unexpected on postoperative day 7 Musculoskeletal: No worrisome lytic or sclerotic osseous abnormality. IMPRESSION: 1. 6.1 x 2.4 x 3.3 cm fluid collection in the gallbladder fossa status post cholecystectomy with JP-drain placement. JP drain is position within this collection. 2. Common bile duct and pancreatic duct stents in situ without intra or extrahepatic biliary duct dilatation. 3. Atrophic right kidney with multiple bilateral renal cysts. 4. 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 5. Aortic Atherosclerosis (ICD10-I70.0). Electronically Signed   By: Misty Stanley M.D.   On: 10/03/2020 20:04   CT Abdomen Pelvis W Contrast  Result Date: 09/24/2020 CLINICAL DATA:  Periumbilical abdominal pain. EXAM: CT ABDOMEN AND PELVIS WITH CONTRAST TECHNIQUE:  Multidetector CT imaging of the abdomen and pelvis was performed using the standard protocol following bolus administration of intravenous contrast. CONTRAST:  48m OMNIPAQUE IOHEXOL 300 MG/ML  SOLN COMPARISON:  None. FINDINGS: Lower chest: The lung bases are clear. The heart is enlarged. Hepatobiliary: The liver is normal. The gallbladder is distended.There appears to be mild intrahepatic and extrahepatic biliary ductal dilatation with the common bile duct measuring up to approximately 1.1 cm in diameter. Pancreas: Normal contours without ductal dilatation. No peripancreatic fluid collection. Spleen: Unremarkable. Adrenals/Urinary Tract: --Adrenal glands: Unremarkable. --Right kidney/ureter: The right kidney is atrophic with multiple simple appearing cysts. --Left kidney/ureter: There are multiple peripelvic cysts and extrarenal pelvis involving the left kidney. There is no evidence for an obstructing stone or hydronephrosis. --Urinary bladder: Unremarkable. Stomach/Bowel: --Stomach/Duodenum: There is moderate distention of the stomach. --Small bowel: Unremarkable. --Colon: Unremarkable. --Appendix: Normal. Vascular/Lymphatic: There are atherosclerotic changes of the abdominal aorta with aneurysmal dilatation of  the infrarenal abdominal aorta. There are tandem fusiform aneurysms. The proximal measures approximately 3.8 x 3.5 cm. The distal aneurysm measures approximately 3.8 x 3.8 cm and extends into the left common iliac artery where there is aneurysmal dilatation measuring approximately 3.9 cm. There is a high-grade stenosis of the proximal right common iliac artery. There are atherosclerotic changes throughout the remaining visualized vascular structures including a high-grade stenosis of the right common femoral artery and proximal right superficial femoral artery. --No retroperitoneal lymphadenopathy. --No mesenteric lymphadenopathy. --No pelvic or inguinal lymphadenopathy. Reproductive: Unremarkable Other: No  ascites or free air. The abdominal wall is normal. Musculoskeletal. No acute displaced fractures. IMPRESSION: 1. Moderate distention of the stomach without evidence for an identifiable cause. 2. Distended gallbladder with mild biliary ductal dilatation. Correlation with laboratory studies is recommended. Consider further evaluation by ultrasound as clinically indicated. 3. Abdominal aortic aneurysms as detailed above. Recommend follow-up ultrasound every 2 years. This recommendation follows ACR consensus guidelines: White Paper of the ACR Incidental Findings Committee II on Vascular Findings. J Am Coll Radiol 2013; 10:789-794. 4. 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 Outpatient surgical consultation is recommended. 5. Additional vascular disease as detailed above. 6. Atrophic right kidney. Aortic Atherosclerosis (ICD10-I70.0). Electronically Signed   By: Constance Holster M.D.   On: 09/24/2020 21:26   DG Chest Port 1 View  Result Date: 09/24/2020 CLINICAL DATA:  Postprandial upper abdominal pain EXAM: PORTABLE CHEST 1 VIEW COMPARISON:  11/06/2008 FINDINGS: Single frontal view of the chest demonstrates postsurgical changes from CABG. Cardiac silhouette is unremarkable. Chronic scarring within the left mid and lower lung zones. No acute airspace disease, effusion, or pneumothorax. No acute bony abnormalities. IMPRESSION: 1. No acute intrathoracic process. Electronically Signed   By: Randa Ngo M.D.   On: 09/24/2020 19:48   DG ERCP BILIARY & PANCREATIC DUCTS  Result Date: 09/30/2020 CLINICAL DATA:  Postop bile leak EXAM: ERCP TECHNIQUE: Multiple spot images obtained with the fluoroscopic device and submitted for interpretation post-procedure. COMPARISON:  Ultrasound 09/25/2020 FINDINGS: A series of fluoroscopic spot images document endoscopic cannulation of the pancreatic duct and subsequently common bile duct. Balloon passage through the CBD. There is incomplete opacification of the  intrahepatic biliary tree which appears mildly distended centrally. Cystic duct is patent with multiple filling defects in the lumen of the residual gallbladder, incompletely opacified. There is extravasation of contrast lateral to the gallbladder remnant, around a surgical drain catheter. Later images document plastic biliary drain catheter placement. IMPRESSION: 1. Bile leak in the region of subtotal cholecystectomy directed towards surgical drain. 2. Endoscopic plastic biliary stent placement. These images were submitted for radiologic interpretation only. Please see the procedural report for the amount of contrast and the fluoroscopy time utilized. Electronically Signed   By: Lucrezia Europe M.D.   On: 09/30/2020 07:14   ECHOCARDIOGRAM COMPLETE  Result Date: 09/25/2020    ECHOCARDIOGRAM REPORT   Patient Name:   IVANN TRIMARCO Date of Exam: 09/25/2020 Medical Rec #:  [id number]        Height:       67.0 in Accession #:    [id number]       Weight:       145.0 lb Date of Birth:  1930-10-16        BSA:          1.764 m Patient Age:    27 years         BP:  115/54 mmHg Patient Gender: M                HR:           58 bpm. Exam Location:  Inpatient Procedure: 2D Echo Indications:    Atrial Fibrillation I48.91  History:        Patient has prior history of Echocardiogram examinations, most                 recent 02/25/2020. CHF, CAD, Aortic Valve Disease,                 Signs/Symptoms:Murmur; Risk Factors:Hypertension and                 Dyslipidemia.  Sonographer:    Mikki Santee RDCS (AE) Referring Phys: [place]  1. Left ventricular ejection fraction, by estimation, is 55%. The left ventricle has normal function. The left ventricle has no regional wall motion abnormalities. Left ventricular diastolic parameters are consistent with Grade I diastolic dysfunction (impaired relaxation).  2. Right ventricular systolic function is mildly reduced. The right ventricular size is normal.  Tricuspid regurgitation signal is inadequate for assessing PA pressure.  3. Left atrial size was mildly dilated.  4. Right atrial size was mildly dilated.  5. The mitral valve is normal in structure. Trivial mitral valve regurgitation. No evidence of mitral stenosis.  6. The aortic valve is tricuspid. Aortic valve regurgitation is trivial. Moderate aortic valve stenosis. Aortic valve mean gradient measures 29.0 mmHg, AVA 1.07 cm^2.  7. Aortic dilatation noted. There is mild dilatation of the aortic root, measuring 38 mm. FINDINGS  Left Ventricle: Left ventricular ejection fraction, by estimation, is 55%. The left ventricle has normal function. The left ventricle has no regional wall motion abnormalities. The left ventricular internal cavity size was normal in size. There is no left ventricular hypertrophy. Left ventricular diastolic parameters are consistent with Grade I diastolic dysfunction (impaired relaxation). Right Ventricle: The right ventricular size is normal. No increase in right ventricular wall thickness. Right ventricular systolic function is mildly reduced. Tricuspid regurgitation signal is inadequate for assessing PA pressure. Left Atrium: Left atrial size was mildly dilated. Right Atrium: Right atrial size was mildly dilated. Pericardium: There is no evidence of pericardial effusion. Mitral Valve: The mitral valve is normal in structure. Mild mitral annular calcification. Trivial mitral valve regurgitation. No evidence of mitral valve stenosis. Tricuspid Valve: The tricuspid valve is normal in structure. Tricuspid valve regurgitation is trivial. Aortic Valve: The aortic valve is tricuspid. Aortic valve regurgitation is trivial. Moderate aortic stenosis is present. Aortic valve mean gradient measures 29.0 mmHg. Aortic valve peak gradient measures 48.0 mmHg. Aortic valve area, by VTI measures 1.14  cm. Pulmonic Valve: The pulmonic valve was normal in structure. Pulmonic valve regurgitation is mild.  Aorta: Aortic dilatation noted. There is mild dilatation of the aortic root, measuring 38 mm. Venous: The inferior vena cava was not well visualized. IAS/Shunts: No atrial level shunt detected by color flow Doppler.  LEFT VENTRICLE PLAX 2D LVIDd:         4.30 cm  Diastology LVIDs:         3.20 cm  LV e' medial:    7.72 cm/s LV PW:         1.40 cm  LV E/e' medial:  7.6 LV IVS:        1.10 cm  LV e' lateral:   9.90 cm/s LVOT diam:     2.40 cm  LV E/e' lateral: 5.9 LV SV:         92 LV SV Index:   52 LVOT Area:     4.52 cm  RIGHT VENTRICLE RV S prime:     7.35 cm/s TAPSE (M-mode): 1.0 cm LEFT ATRIUM             Index       RIGHT ATRIUM           Index LA diam:        4.40 cm 2.49 cm/m  RA Area:     21.60 cm LA Vol (A2C):   56.1 ml 31.80 ml/m RA Volume:   59.30 ml  33.62 ml/m LA Vol (A4C):   49.0 ml 27.78 ml/m LA Biplane Vol: 53.2 ml 30.16 ml/m  AORTIC VALVE AV Area (Vmax):    1.14 cm AV Area (Vmean):   1.05 cm AV Area (VTI):     1.14 cm AV Vmax:           346.25 cm/s AV Vmean:          241.500 cm/s AV VTI:            0.805 m AV Peak Grad:      48.0 mmHg AV Mean Grad:      29.0 mmHg LVOT Vmax:         87.50 cm/s LVOT Vmean:        56.300 cm/s LVOT VTI:          0.203 m LVOT/AV VTI ratio: 0.25  AORTA Ao Root diam: 3.50 cm MITRAL VALVE MV Area (PHT): 2.73 cm    SHUNTS MV Decel Time: 278 msec    Systemic VTI:  0.20 m MV E velocity: 58.70 cm/s  Systemic Diam: 2.40 cm MV A velocity: 85.70 cm/s MV E/A ratio:  0.68 Loralie Champagne MD Electronically signed by Loralie Champagne MD Signature Date/Time: 09/25/2020/3:08:51 PM    Final    US Abdomen Limited RUQ (LIVER/GB)  Result Date: 09/25/2020 CLINICAL DATA:  Abdominal pain. EXAM: ULTRASOUND ABDOMEN LIMITED RIGHT UPPER QUADRANT COMPARISON:  None. FINDINGS: Gallbladder: The gallbladder is poorly visualized secondary to overlying bowel gas. A small amount of echogenic sludge is suspected along the dependent portion of the gallbladder. No gallstones are identified. The  gallbladder wall measures approximately 4.0 mm in thickness. The ultrasound technologist was unable to technically assess for the presence or absence of a sonographic Murphy's sign, however, the patient demonstrated tenderness over the gallbladder during the exam. Common bile duct: Diameter: 5.4 mm Liver: No focal lesion identified. Within normal limits in parenchymal echogenicity. Portal vein is patent on color Doppler imaging with normal direction of blood flow towards the liver. Other: Perihepatic free fluid is seen. IMPRESSION: 1. Markedly limited study, as described above. 2. Suspected gallbladder sludge without evidence of cholelithiasis. 3. Perihepatic free fluid. Electronically Signed   By: Virgina Norfolk M.D.   On: 09/25/2020 02:19     TODAY-DAY OF DISCHARGE:  Subjective:   Sherron Ales today has no headache,no chest abdominal pain,no new weakness tingling or numbness, feels much better wants to go home today.   Objective:   Blood pressure (!) 126/56, pulse 65, temperature 98.9 F (37.2 C), temperature source Axillary, resp. rate 20, height _0  (1.702 m), weight 65.8 kg, SpO2 93 %.  Intake/Output Summary (Last 24 hours) at 10/04/2020 0959 Last data filed at 10/03/2020 1711 Gross per 24 hour  Intake --  Output 30 ml  Net -30 ml  Filed Weights   09/24/20 1841 09/29/20 1527  Weight: 65.8 kg 65.8 kg    Exam: Awake Alert, Oriented *3, No new F.N deficits, Normal affect [place].AT,PERRAL Supple Neck,No JVD, No cervical lymphadenopathy appriciated.  Symmetrical Chest wall movement, Good air movement bilaterally, CTAB RRR,No Gallops,Rubs or new Murmurs, No Parasternal Heave +ve B.Sounds, Abd Soft, Non tender, No organomegaly appriciated, No rebound -guarding or rigidity. No Cyanosis, Clubbing or edema, No new Rash or bruise   PERTINENT RADIOLOGIC STUDIES: CT ABDOMEN PELVIS WO CONTRAST  Result Date: 10/03/2020 CLINICAL DATA:  Status post cholecystectomy 09/26/2020. EXAM: CT  ABDOMEN AND PELVIS WITHOUT CONTRAST TECHNIQUE: Multidetector CT imaging of the abdomen and pelvis was performed following the standard protocol without IV contrast. COMPARISON:  09/24/2020 FINDINGS: Lower chest: Minimal dependent atelectasis in the lung bases. Hepatobiliary: No focal abnormality in the liver on this study without intravenous contrast. Fluid collection identified in the gallbladder fossa measuring approximately 6.1 x 2.4 x 3.3 cm. The JP drain courses through this collection. Common bile duct stent visualized in situ without intra or extrahepatic biliary duct dilatation. Pancreas: Pancreatic duct stent identified with distal loop formed in the duodenum. No main duct dilatation. Spleen: No splenomegaly. No focal mass lesion. Adrenals/Urinary Tract: No adrenal nodule or mass. Right kidney is atrophic with multiple cysts evident. Central sinus cysts in both kidneys were better demonstrated on the previous CT scan performed with intravenous contrast material. No evidence for hydroureter. The urinary bladder appears normal for the degree of distention. Stomach/Bowel: Stomach is unremarkable. No gastric wall thickening. No evidence of outlet obstruction. Duodenum is normally positioned as is the ligament of Treitz. No small bowel wall thickening. No small bowel dilatation. The terminal ileum is normal. The appendix is normal. No gross colonic mass. No colonic wall thickening. Vascular/Lymphatic: There is abdominal aortic atherosclerosis without aneurysm. Distal abdominal aorta is dilated up to 3.6 cm with aneurysmal dilatation of the left common iliac artery at 3.7 cm. Right common iliac artery measures 1.8 cm diameter. There is no gastrohepatic or hepatoduodenal ligament lymphadenopathy. No retroperitoneal or mesenteric lymphadenopathy. No pelvic sidewall lymphadenopathy. Reproductive: The prostate gland and seminal vesicles are unremarkable. Other: No intraperitoneal free fluid. Intraperitoneal free air  scattered in the abdomen is not unexpected on postoperative day 7 Musculoskeletal: No worrisome lytic or sclerotic osseous abnormality. IMPRESSION: 1. 6.1 x 2.4 x 3.3 cm fluid collection in the gallbladder fossa status post cholecystectomy with JP-drain placement. JP drain is position within this collection. 2. Common bile duct and pancreatic duct stents in situ without intra or extrahepatic biliary duct dilatation. 3. Atrophic right kidney with multiple bilateral renal cysts. 4. 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 5. Aortic Atherosclerosis (ICD10-I70.0). Electronically Signed   By: Misty Stanley M.D.   On: 10/03/2020 20:04     PERTINENT LAB RESULTS: CBC: Recent Labs    10/03/20 0038 10/04/20 0141  WBC 16.8* 15.1*  HGB 13.4 13.5  HCT 40.6 41.0  PLT 344 355   CMET CMP     Component Value Date/Time   NA 141 10/04/2020 0141   K 3.9 10/04/2020 0141   CL 105 10/04/2020 0141   CO2 24 10/04/2020 0141   GLUCOSE 89 10/04/2020 0141   BUN 26 (H) 10/04/2020 0141   CREATININE 1.47 (H) 10/04/2020 0141   CALCIUM 9.2 10/04/2020 0141   PROT 5.4 (L) 10/04/2020 0141   ALBUMIN 2.5 (L) 10/04/2020 0141   AST 36 10/04/2020 0141   ALT 32 10/04/2020 0141  ALKPHOS 82 10/04/2020 0141   BILITOT 0.6 10/04/2020 0141   GFRNONAA 46 (L) 10/04/2020 0141   GFRAA (L) 11/08/2008 0420    52        The eGFR has been calculated using the MDRD equation. This calculation has not been validated in all clinical situations. eGFR's persistently <60 mL/min signify possible Chronic Kidney Disease.    GFR Estimated Creatinine Clearance: 32.9 mL/min (A) (by C-G formula based on SCr of 1.47 mg/dL (H)). No results for input(s): LIPASE, AMYLASE in the last 72 hours. No results for input(s): CKTOTAL, CKMB, CKMBINDEX, TROPONINI in the last 72 hours. Invalid input(s): POCBNP No results for input(s): DDIMER in the last 72 hours. No results for input(s): HGBA1C in the last 72 hours. No results for  input(s): CHOL, HDL, LDLCALC, TRIG, CHOLHDL, LDLDIRECT in the last 72 hours. No results for input(s): TSH, T4TOTAL, T3FREE, THYROIDAB in the last 72 hours.  Invalid input(s): FREET3 No results for input(s): VITAMINB12, FOLATE, FERRITIN, TIBC, IRON, RETICCTPCT in the last 72 hours. Coags: No results for input(s): INR in the last 72 hours.  Invalid input(s): PT Microbiology: Recent Results (from the past 240 hour(s))  Resp Panel by RT-PCR (Flu A&B, Covid) Nasopharyngeal Swab     Status: None   Collection Time: 09/24/20  9:25 PM   Specimen: Nasopharyngeal Swab; Nasopharyngeal(NP) swabs in vial transport medium  Result Value Ref Range Status   SARS Coronavirus 2 by RT PCR NEGATIVE NEGATIVE Final    Comment: (NOTE) SARS-CoV-2 target nucleic acids are NOT DETECTED.  The SARS-CoV-2 RNA is generally detectable in upper respiratory specimens during the acute phase of infection. The lowest concentration of SARS-CoV-2 viral copies this assay can detect is 138 copies/mL. A negative result does not preclude SARS-Cov-2 infection and should not be used as the sole basis for treatment or other patient management decisions. A negative result may occur with  improper specimen collection/handling, submission of specimen other than nasopharyngeal swab, presence of viral mutation(s) within the areas targeted by this assay, and inadequate number of viral copies(<138 copies/mL). A negative result must be combined with clinical observations, patient history, and epidemiological information. The expected result is Negative.  Fact Sheet for Patients:  EntrepreneurPulse.com.au  Fact Sheet for Healthcare Providers:  IncredibleEmployment.be  This test is no t yet approved or cleared by the [place] FDA and  has been authorized for detection and/or diagnosis of SARS-CoV-2 by FDA under an Emergency Use Authorization (EUA). This EUA will remain  in effect (meaning  this test can be used) for the duration of the COVID-19 declaration under Section 564(b)(1) of the Act, 21 U.S.C.section 360bbb-3(b)(1), unless the authorization is terminated  or revoked sooner.       Influenza A by PCR NEGATIVE NEGATIVE Final   Influenza B by PCR NEGATIVE NEGATIVE Final    Comment: (NOTE) The Xpert Xpress SARS-CoV-2/FLU/RSV plus assay is intended as an aid in the diagnosis of influenza from Nasopharyngeal swab specimens and should not be used as a sole basis for treatment. Nasal washings and aspirates are unacceptable for Xpert Xpress SARS-CoV-2/FLU/RSV testing.  Fact Sheet for Patients: EntrepreneurPulse.com.au  Fact Sheet for Healthcare Providers: IncredibleEmployment.be  This test is not yet approved or cleared by the [place] FDA and has been authorized for detection and/or diagnosis of SARS-CoV-2 by FDA under an Emergency Use Authorization (EUA). This EUA will remain in effect (meaning this test can be used) for the duration of the COVID-19 declaration under Section 564(b)(1) of the Act,  21 U.S.C. section 360bbb-3(b)(1), unless the authorization is terminated or revoked.  Performed at [facility], [address], [place], [place] [address]   Surgical PCR screen     Status: None   Collection Time: 09/25/20 11:30 PM   Specimen: Nasal Mucosa; Nasal Swab  Result Value Ref Range Status   MRSA, PCR NEGATIVE NEGATIVE Final   Staphylococcus aureus NEGATIVE NEGATIVE Final    Comment: (NOTE) The Xpert SA Assay (FDA approved for NASAL specimens in patients 17 years of age and older), is one component of a comprehensive surveillance program. It is not intended to diagnose infection nor to guide or monitor treatment. Performed at [facility], [place] [address] [place], [place] [address]     FURTHER DISCHARGE INSTRUCTIONS:  Get Medicines reviewed and adjusted: Please take all your medications with  you for your next visit with your Primary MD  Laboratory/radiological data: Please request your Primary MD to go over all hospital tests and procedure/radiological results at the follow up, please ask your Primary MD to get all Hospital records sent to his/her office.  In some cases, they will be blood work, cultures and biopsy results pending at the time of your discharge. Please request that your primary care M.D. goes through all the records of your hospital data and follows up on these results.  Also Note the following: If you experience worsening of your admission symptoms, develop shortness of breath, life threatening emergency, suicidal or homicidal thoughts you must seek medical attention immediately by calling 911 or calling your MD immediately  if symptoms less severe.  You must read complete instructions/literature along with all the possible adverse reactions/side effects for all the Medicines you take and that have been prescribed to you. Take any new Medicines after you have completely understood and accpet all the possible adverse reactions/side effects.   Do not drive when taking Pain medications or sleeping medications (Benzodaizepines)  Do not take more than prescribed Pain, Sleep and Anxiety Medications. It is not advisable to combine anxiety,sleep and pain medications without talking with your primary care practitioner  Special Instructions: If you have smoked or chewed Tobacco  in the last 2 yrs please stop smoking, stop any regular Alcohol  and or any Recreational drug use.  Wear Seat belts while driving.  Please note: You were cared for by a hospitalist during your hospital stay. Once you are discharged, your primary care physician will handle any further medical issues. Please note that NO REFILLS for any discharge medications will be authorized once you are discharged, as it is imperative that you return to your primary care physician (or establish a relationship with a  primary care physician if you do not have one) for your post hospital discharge needs so that they can reassess your need for medications and monitor your lab values.  Total Time spent coordinating discharge including counseling, education and face to face time equals 35 minutes.  SignedOren Binet 10/04/2020 9:59 AM

## 2020-10-04 NOTE — Telephone Encounter (Signed)
Patient is still currently admitted. 

## 2020-10-04 NOTE — Progress Notes (Addendum)
Progress Note   Subjective  No complaints. No abdominal pain. Eating well.   Objective  Vital signs in last 24 hours: Temp:  [98.7 F (37.1 C)-98.9 F (37.2 C)] 98.9 F (37.2 C) (05/11 0411) Pulse Rate:  [65-77] 65 (05/11 0411) Resp:  [18-20] 20 (05/11 0411) BP: (122-126)/(56-79) 126/56 (05/11 0411) SpO2:  [93 %-100 %] 93 % (05/11 0411) Last BM Date: 09/30/20  General: Alert, well-developed, in NAD Heart:  Regular rate and rhythm; no murmurs Chest: Clear to ascultation bilaterally Abdomen:  Soft, nontender and nondistended. Normal bowel sounds, without guarding, and without rebound. Drain in right flank.    Extremities:  Without edema. Neurologic:  Alert and  oriented x4; grossly normal neurologically. Psych:  Alert and cooperative. Normal mood and affect.  Intake/Output from previous day: 05/10 0701 - 05/11 0700 In: 240 [P.O.:240] Out: 30 [Drains:30] Intake/Output this shift: No intake/output data recorded.  Lab Results: Recent Labs    10/03/20 0038 10/04/20 0141  WBC 16.8* 15.1*  HGB 13.4 13.5  HCT 40.6 41.0  PLT 344 355   BMET Recent Labs    10/03/20 0038 10/04/20 0141  NA 138 141  K 3.7 3.9  CL 103 105  CO2 26 24  GLUCOSE 107* 89  BUN 29* 26*  CREATININE 1.58* 1.47*  CALCIUM 8.9 9.2   LFT Recent Labs    10/04/20 0141  PROT 5.4*  ALBUMIN 2.5*  AST 36  ALT 32  ALKPHOS 82  BILITOT 0.6    Studies/Results: CT ABDOMEN PELVIS WO CONTRAST  Result Date: 10/03/2020 CLINICAL DATA:  Status post cholecystectomy 09/26/2020. EXAM: CT ABDOMEN AND PELVIS WITHOUT CONTRAST TECHNIQUE: Multidetector CT imaging of the abdomen and pelvis was performed following the standard protocol without IV contrast. COMPARISON:  09/24/2020 FINDINGS: Lower chest: Minimal dependent atelectasis in the lung bases. Hepatobiliary: No focal abnormality in the liver on this study without intravenous contrast. Fluid collection identified in the gallbladder fossa measuring  approximately 6.1 x 2.4 x 3.3 cm. The JP drain courses through this collection. Common bile duct stent visualized in situ without intra or extrahepatic biliary duct dilatation. Pancreas: Pancreatic duct stent identified with distal loop formed in the duodenum. No main duct dilatation. Spleen: No splenomegaly. No focal mass lesion. Adrenals/Urinary Tract: No adrenal nodule or mass. Right kidney is atrophic with multiple cysts evident. Central sinus cysts in both kidneys were better demonstrated on the previous CT scan performed with intravenous contrast material. No evidence for hydroureter. The urinary bladder appears normal for the degree of distention. Stomach/Bowel: Stomach is unremarkable. No gastric wall thickening. No evidence of outlet obstruction. Duodenum is normally positioned as is the ligament of Treitz. No small bowel wall thickening. No small bowel dilatation. The terminal ileum is normal. The appendix is normal. No gross colonic mass. No colonic wall thickening. Vascular/Lymphatic: There is abdominal aortic atherosclerosis without aneurysm. Distal abdominal aorta is dilated up to 3.6 cm with aneurysmal dilatation of the left common iliac artery at 3.7 cm. Right common iliac artery measures 1.8 cm diameter. There is no gastrohepatic or hepatoduodenal ligament lymphadenopathy. No retroperitoneal or mesenteric lymphadenopathy. No pelvic sidewall lymphadenopathy. Reproductive: The prostate gland and seminal vesicles are unremarkable. Other: No intraperitoneal free fluid. Intraperitoneal free air scattered in the abdomen is not unexpected on postoperative day 7 Musculoskeletal: No worrisome lytic or sclerotic osseous abnormality. IMPRESSION: 1. 6.1 x 2.4 x 3.3 cm fluid collection in the gallbladder fossa status post cholecystectomy with JP-drain placement. JP drain is position within  this collection. 2. Common bile duct and pancreatic duct stents in situ without intra or extrahepatic biliary duct  dilatation. 3. Atrophic right kidney with multiple bilateral renal cysts. 4. 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 5. Aortic Atherosclerosis (ICD10-I70.0). Electronically Signed   By: Misty Stanley M.D.   On: 10/03/2020 20:04      Assessment & Recommendations   1. S/P lap subtotal cholecystectomy on 5/3. Bile leak treated with sphincterotomy, 10 fr, 7 cm plastic biliary stent, PD stent on 5/7. No abdominal pain or tenderness. Drainage serous and bilious. The drain output is decreasing with 30 cc last recorded. The CBD stent is functioning well to treat the bile leak. CT AP shows a small fluid collection in the GB fossa, the drain is in good position with this collection and CBD and PD stents in good position. OK for discharge from GI standpoint. Follow up with Dr. Rush Landmark has been arranged. GI signing off.      LOS: 7 days   Taniya Dasher T. Fuller Plan MD 10/04/2020, 8:31 AM [phone]

## 2020-10-04 NOTE — Care Management Important Message (Signed)
Important Message  Patient Details  Name: Juan Benjamin MRN: [id number] Date of Birth: Mar 16, 1931   Medicare Important Message Given:  Yes     Ashritha Desrosiers P Bobie Caris 10/04/2020, 11:55 AM

## 2020-10-06 NOTE — Telephone Encounter (Signed)
Patient contacted regarding discharge from [facility] on 10/04/20.  Patient understands to follow up with provider Dr. Percival Spanish on 10/17/20 at 1:40 pm at [facility]. Patient understands discharge instructions? yes Patient understands medications and regiment? yes Patient understands to bring all medications to this visit? yes

## 2020-10-10 DIAGNOSIS — I4891 Unspecified atrial fibrillation: Secondary | ICD-10-CM | POA: Diagnosis not present

## 2020-10-10 DIAGNOSIS — I714 Abdominal aortic aneurysm, without rupture: Secondary | ICD-10-CM | POA: Diagnosis not present

## 2020-10-10 DIAGNOSIS — K819 Cholecystitis, unspecified: Secondary | ICD-10-CM | POA: Diagnosis not present

## 2020-10-10 DIAGNOSIS — Z09 Encounter for follow-up examination after completed treatment for conditions other than malignant neoplasm: Secondary | ICD-10-CM | POA: Diagnosis not present

## 2020-10-10 LAB — TOTAL BILIRUBIN, BODY FLUID: Total bilirubin, fluid: 13.7 mg/dL

## 2020-10-11 DIAGNOSIS — Z85820 Personal history of malignant melanoma of skin: Secondary | ICD-10-CM | POA: Diagnosis not present

## 2020-10-11 DIAGNOSIS — Z8546 Personal history of malignant neoplasm of prostate: Secondary | ICD-10-CM | POA: Diagnosis not present

## 2020-10-11 DIAGNOSIS — I6522 Occlusion and stenosis of left carotid artery: Secondary | ICD-10-CM | POA: Diagnosis not present

## 2020-10-11 DIAGNOSIS — N1831 Chronic kidney disease, stage 3a: Secondary | ICD-10-CM | POA: Diagnosis not present

## 2020-10-11 DIAGNOSIS — Z87891 Personal history of nicotine dependence: Secondary | ICD-10-CM | POA: Diagnosis not present

## 2020-10-11 DIAGNOSIS — E039 Hypothyroidism, unspecified: Secondary | ICD-10-CM | POA: Diagnosis not present

## 2020-10-11 DIAGNOSIS — I350 Nonrheumatic aortic (valve) stenosis: Secondary | ICD-10-CM | POA: Diagnosis not present

## 2020-10-11 DIAGNOSIS — Z8701 Personal history of pneumonia (recurrent): Secondary | ICD-10-CM | POA: Diagnosis not present

## 2020-10-11 DIAGNOSIS — I700 Atherosclerosis of aorta: Secondary | ICD-10-CM | POA: Diagnosis not present

## 2020-10-11 DIAGNOSIS — E785 Hyperlipidemia, unspecified: Secondary | ICD-10-CM | POA: Diagnosis not present

## 2020-10-11 DIAGNOSIS — Z9181 History of falling: Secondary | ICD-10-CM | POA: Diagnosis not present

## 2020-10-11 DIAGNOSIS — Z48815 Encounter for surgical aftercare following surgery on the digestive system: Secondary | ICD-10-CM | POA: Diagnosis not present

## 2020-10-11 DIAGNOSIS — B370 Candidal stomatitis: Secondary | ICD-10-CM | POA: Diagnosis not present

## 2020-10-11 DIAGNOSIS — Z951 Presence of aortocoronary bypass graft: Secondary | ICD-10-CM | POA: Diagnosis not present

## 2020-10-11 DIAGNOSIS — I2510 Atherosclerotic heart disease of native coronary artery without angina pectoris: Secondary | ICD-10-CM | POA: Diagnosis not present

## 2020-10-11 DIAGNOSIS — K219 Gastro-esophageal reflux disease without esophagitis: Secondary | ICD-10-CM | POA: Diagnosis not present

## 2020-10-11 DIAGNOSIS — K449 Diaphragmatic hernia without obstruction or gangrene: Secondary | ICD-10-CM | POA: Diagnosis not present

## 2020-10-11 DIAGNOSIS — Z7982 Long term (current) use of aspirin: Secondary | ICD-10-CM | POA: Diagnosis not present

## 2020-10-11 DIAGNOSIS — I480 Paroxysmal atrial fibrillation: Secondary | ICD-10-CM | POA: Diagnosis not present

## 2020-10-11 DIAGNOSIS — I130 Hypertensive heart and chronic kidney disease with heart failure and stage 1 through stage 4 chronic kidney disease, or unspecified chronic kidney disease: Secondary | ICD-10-CM | POA: Diagnosis not present

## 2020-10-11 DIAGNOSIS — I509 Heart failure, unspecified: Secondary | ICD-10-CM | POA: Diagnosis not present

## 2020-10-11 DIAGNOSIS — I739 Peripheral vascular disease, unspecified: Secondary | ICD-10-CM | POA: Diagnosis not present

## 2020-10-16 ENCOUNTER — Encounter: Payer: Self-pay | Admitting: Cardiology

## 2020-10-16 DIAGNOSIS — J189 Pneumonia, unspecified organism: Secondary | ICD-10-CM | POA: Insufficient documentation

## 2020-10-16 DIAGNOSIS — J690 Pneumonitis due to inhalation of food and vomit: Secondary | ICD-10-CM | POA: Insufficient documentation

## 2020-10-16 DIAGNOSIS — I350 Nonrheumatic aortic (valve) stenosis: Secondary | ICD-10-CM | POA: Insufficient documentation

## 2020-10-16 DIAGNOSIS — C61 Malignant neoplasm of prostate: Secondary | ICD-10-CM | POA: Insufficient documentation

## 2020-10-16 NOTE — Progress Notes (Signed)
Cardiology Office Note   Date:  10/17/2020   ID:  Seiji, Wiswell [birth year], MRN [id number]  PCP:  Aletha Halim., PA-C  Cardiologist:   Minus Breeding, MD   Chief Complaint  Patient presents with  . Atrial Fibrillation      History of Present Illness: MAY OZMENT is a 87 y.o. male who presents for evaluation of CAD.  He returns for follow up .  He was in the hospital a couple of weeks ago with acute cholecystitis.  I reviewed these records for this visit.    He had PAF.    He had an echo during that admission with a normal EF.  AS was moderate.  The mean gradient was 29 mm.   I reviewed these records for this visit.    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  He actually did ultimately well and has been working with physical therapy and wants to get back on a treadmill. The patient denies any new symptoms such as chest discomfort, neck or arm discomfort. There has been no new shortness of breath, PND or orthopnea. There have been no reported palpitations, presyncope or syncope.    Past Medical History:  Diagnosis Date  . Aortic stenosis   . Carotid artery occlusion    Chronic occlusion of left, R CEA  . Coronary artery disease   . GERD (gastroesophageal reflux disease)   . Hyperlipidemia   . Hypertension   . Hypothyroidism   . PAF (paroxysmal atrial fibrillation) ([place])   . Pneumonia   . Prostate cancer [facility])     Past Surgical History:  Procedure Laterality Date  . BILIARY STENT PLACEMENT  09/29/2020   Procedure: BILIARY STENT PLACEMENT;  Surgeon: Irving Copas., MD;  Location: [place];  Service: Gastroenterology;;  . BIOPSY  09/29/2020   Procedure: BIOPSY;  Surgeon: Irving Copas., MD;  Location: [place];  Service: Gastroenterology;;  . CAROTID ARTERY SURGERY     Right   . CHOLECYSTECTOMY N/A 09/26/2020   Procedure: LAPAROSCOPIC SUBTOTAL CHOLECYSTECTOMY;  Surgeon: Jesusita Oka, MD;  Location: [place];  Service: General;  Laterality: N/A;  . CORONARY ARTERY BYPASS GRAFT     CABG (4 vessel, 2000.  No report in [place])  . ENDOSCOPIC RETROGRADE CHOLANGIOPANCREATOGRAPHY (ERCP) WITH PROPOFOL N/A 09/29/2020   Procedure: ENDOSCOPIC RETROGRADE CHOLANGIOPANCREATOGRAPHY (ERCP) WITH PROPOFOL;  Surgeon: Rush Landmark Telford Nab., MD;  Location: [place];  Service: Gastroenterology;  Laterality: N/A;  . MELANOMA EXCISION WITH SENTINEL LYMPH NODE BIOPSY Left 03/21/2020   Procedure: WIDE LOCAL EXCISION LEFT UPPER ARM MELANOMA ADVANCEMENT FLAP CLOSURE FOR DEFECT 4X8CM SENTINEL LYMPH NODE MAPPING AND BIOPSY;  Surgeon: Stark Klein, MD;  Location: [place];  Service: General;  Laterality: Left;  . PANCREATIC STENT PLACEMENT  09/29/2020   Procedure: PANCREATIC STENT PLACEMENT;  Surgeon: Rush Landmark Telford Nab., MD;  Location: [place];  Service: Gastroenterology;;  . REMOVAL OF STONES  09/29/2020   Procedure: REMOVAL OF STONES;  Surgeon: Irving Copas., MD;  Location: [place];  Service: Gastroenterology;;  . RENAL ARTERY STENT  07/02/1999   RIGHT SIDE  . SPHINCTEROTOMY  09/29/2020   Procedure: SPHINCTEROTOMY;  Surgeon: Mansouraty, Telford Nab., MD;  Location: [place];  Service: Gastroenterology;;     Current Outpatient Medications  Medication Sig Dispense Refill  . acetaminophen (TYLENOL) 325 MG tablet Take 325-650 mg by mouth every 6 (six) hours as needed for  moderate pain (ear pain).     Marland Kitchen allopurinol (ZYLOPRIM) 100 MG tablet Take 100 mg by mouth at bedtime.     Marland Kitchen amLODipine (NORVASC) 5 MG tablet Take 2 tablets (10 mg total) by mouth daily. 60 tablet 0  . aspirin 81 MG tablet Take 1 tablet (81 mg total) by mouth daily. 90 tablet 3  . Cholecalciferol (VITAMIN D-3) 125 MCG (5000 UT) TABS Take 5,000 Units by mouth daily.    Marland Kitchen EPINEPHrine 0.3 mg/0.3 mL IJ SOAJ injection Inject 0.3 mLs (0.3 mg total) into the muscle as needed for anaphylaxis. 1 each 0  . levothyroxine  (SYNTHROID) 88 MCG tablet     . multivitamin-lutein (OCUVITE-LUTEIN) CAPS capsule Take 1 capsule by mouth daily.    . propranolol (INDERAL) 40 MG tablet Take 40 mg by mouth at bedtime.    . rosuvastatin (CRESTOR) 20 MG tablet Take 1 tablet (20 mg total) by mouth daily. (Patient taking differently: Take 10 mg by mouth at bedtime.) 90 tablet 3  . vitamin B-12 (CYANOCOBALAMIN) 1000 MCG tablet Take 1,000 mcg by mouth daily.    Marland Kitchen zinc oxide (BALMEX) 11.3 % CREA cream Apply 1 application topically daily as needed (pressure spot).     No current facility-administered medications for this visit.    Allergies:   Wasp venom, Bee venom, Crestor [rosuvastatin], Gemfibrozil, Indomethacin, Lipitor [atorvastatin], Lisinopril, Rofecoxib, Sildenafil, and Simvastatin    ROS:  Please see the history of present illness.   Otherwise, review of systems are positive for none.   All other systems are reviewed and negative.    PHYSICAL EXAM: VS:  BP (!) 138/50 (BP Location: Right Arm, Patient Position: Sitting, Cuff Size: Normal)   Pulse 64   Ht 5' 7.5" (1.715 m)   Wt 138 lb 6.4 oz (62.8 kg)   BMI 21.36 kg/m  , BMI Body mass index is 21.36 kg/m. GENERAL:  Well appearing NECK:  No jugular venous distention, waveform within normal limits, carotid upstroke brisk and symmetric, bilateral bruits, no thyromegaly LUNGS:  Clear to auscultation bilaterally CHEST:  Unremarkable HEART:  PMI not displaced or sustained,S1 and S2 within normal limits, no S3, no S4, no clicks, no rubs, 3 out of 6 apical systolic murmur radiating out the aortic outflow tract and mid peaking, no diastolic murmurs ABD:  Flat, positive bowel sounds normal in frequency in pitch, no bruits, no rebound, no guarding, no midline pulsatile mass, no hepatomegaly, no splenomegaly EXT:  2 plus pulses upper and absent dorsalis pedis and posterior tibialis bilateral, no edema, no cyanosis no clubbing  EKG:  EKG is  ordered today. The ekg ordered today  demonstrates sinus bradycardia, rate 27 old inferior infarct, old anteroseptal infarct, premature atrial contractions, no acute ST-T wave changes.  No change from previous   Recent Labs: 09/26/2020: Magnesium 1.7 09/30/2020: TSH 2.600 10/04/2020: ALT 32; BUN 26; Creatinine, Ser 1.47; Hemoglobin 13.5; Platelets 355; Potassium 3.9; Sodium 141    Lipid Panel    Component Value Date/Time   CHOL  11/08/2008 0420    129        ATP III CLASSIFICATION:  <200     mg/dL   Desirable  200-239  mg/dL   Borderline High  >=240    mg/dL   High          TRIG 77 11/08/2008 0420   HDL 34 (L) 11/08/2008 0420   CHOLHDL 3.8 11/08/2008 0420   VLDL 15 11/08/2008 0420   LDLCALC  11/08/2008 0420  80        Total Cholesterol/HDL:CHD Risk Coronary Heart Disease Risk Table                     Men   Women  1/2 Average Risk   3.4   3.3  Average Risk       5.0   4.4  2 X Average Risk   9.6   7.1  3 X Average Risk  23.4   11.0        Use the calculated Patient Ratio above and the CHD Risk Table to determine the patient's CHD Risk.        ATP III CLASSIFICATION (LDL):  <100     mg/dL   Optimal  100-129  mg/dL   Near or Above                    Optimal  130-159  mg/dL   Borderline  160-189  mg/dL   High  >190     mg/dL   Very High      Wt Readings from Last 3 Encounters:  10/17/20 138 lb 6.4 oz (62.8 kg)  09/29/20 145 lb 1 oz (65.8 kg)  03/21/20 147 lb 0.8 oz (66.7 kg)      Other studies Reviewed: Additional studies/ records that were reviewed today include: Hospital records extensively reviewed. Review of the above records demonstrates:  Please see elsewhere in the note.     ASSESSMENT AND PLAN:  Coronary artery disease -  He had no evidence of ischemia despite his recent acute illness.  No change in therapy.   Carotid artery occlusion -  He has an occluded left carotid and less than 50% in the right in October.  We will follow this with serial exams and I can defer to Dr. Trula Slade who  will be seeing him for his iliac aneurysms.  Hyperlipidemia -  He has not tolerated statins more than the 10 mg of Crestor.  His LDL was 100.  He would prefer not to be on a PCSK9 inhibitor and I think this is reasonable.  AS-  This was moderate in the hospital this month.  I will follow this clinically.    PAF -  I had a long conversation with his daughter.  This episode of atrial fibrillation was related to his acute illness and I do not think this relegated him to lifelong anticoagulation and they are happy with this discussion.   HTN  His lisinopril was stopped and his amlodipine increased recently.  No change in therapy.  CKD IIIB Creatinine is 1.7 which is mildly higher than previous and we discussed hydration.   Current medicines are reviewed at length with the patient today.  The patient does not have concerns regarding medicines.  The following changes have been made:   None  Labs/ tests ordered today include: None  Orders Placed This Encounter  Procedures  . EKG 12-Lead     Disposition:   FU with me in 12 months.  Or sooner if needed   Signed, Minus Breeding, MD  10/17/2020 2:15 PM    [facility]

## 2020-10-17 ENCOUNTER — Encounter: Payer: Self-pay | Admitting: Cardiology

## 2020-10-17 ENCOUNTER — Other Ambulatory Visit: Payer: Self-pay

## 2020-10-17 ENCOUNTER — Ambulatory Visit: Payer: PPO | Admitting: Cardiology

## 2020-10-17 VITALS — BP 138/50 | HR 64 | Ht 67.5 in | Wt 138.4 lb

## 2020-10-17 DIAGNOSIS — I6522 Occlusion and stenosis of left carotid artery: Secondary | ICD-10-CM

## 2020-10-17 DIAGNOSIS — I2510 Atherosclerotic heart disease of native coronary artery without angina pectoris: Secondary | ICD-10-CM

## 2020-10-17 DIAGNOSIS — I350 Nonrheumatic aortic (valve) stenosis: Secondary | ICD-10-CM

## 2020-10-17 DIAGNOSIS — I480 Paroxysmal atrial fibrillation: Secondary | ICD-10-CM | POA: Diagnosis not present

## 2020-10-17 DIAGNOSIS — E785 Hyperlipidemia, unspecified: Secondary | ICD-10-CM | POA: Diagnosis not present

## 2020-10-17 DIAGNOSIS — C61 Malignant neoplasm of prostate: Secondary | ICD-10-CM | POA: Diagnosis not present

## 2020-10-17 NOTE — Patient Instructions (Signed)
Medication Instructions:  No Changes In Medications at this time.  *If you need a refill on your cardiac medications before your next appointment, please call your pharmacy*  Follow-Up: At [facility], you and your health needs are our priority.  As part of our continuing mission to provide you with exceptional heart care, we have created designated Provider Care Teams.  These Care Teams include your primary Cardiologist (physician) and Advanced Practice Providers (APPs -  Physician Assistants and Nurse Practitioners) who all work together to provide you with the care you need, when you need it.  Your next appointment:   1 year(s)  The format for your next appointment:   In Person  Provider:   Minus Breeding, MD

## 2020-10-26 DIAGNOSIS — Z9181 History of falling: Secondary | ICD-10-CM | POA: Diagnosis not present

## 2020-10-26 DIAGNOSIS — Z8701 Personal history of pneumonia (recurrent): Secondary | ICD-10-CM | POA: Diagnosis not present

## 2020-10-26 DIAGNOSIS — Z87891 Personal history of nicotine dependence: Secondary | ICD-10-CM | POA: Diagnosis not present

## 2020-10-26 DIAGNOSIS — I350 Nonrheumatic aortic (valve) stenosis: Secondary | ICD-10-CM | POA: Diagnosis not present

## 2020-10-26 DIAGNOSIS — I480 Paroxysmal atrial fibrillation: Secondary | ICD-10-CM | POA: Diagnosis not present

## 2020-10-26 DIAGNOSIS — K449 Diaphragmatic hernia without obstruction or gangrene: Secondary | ICD-10-CM | POA: Diagnosis not present

## 2020-10-26 DIAGNOSIS — Z951 Presence of aortocoronary bypass graft: Secondary | ICD-10-CM | POA: Diagnosis not present

## 2020-10-26 DIAGNOSIS — I739 Peripheral vascular disease, unspecified: Secondary | ICD-10-CM | POA: Diagnosis not present

## 2020-10-26 DIAGNOSIS — B370 Candidal stomatitis: Secondary | ICD-10-CM | POA: Diagnosis not present

## 2020-10-26 DIAGNOSIS — I2510 Atherosclerotic heart disease of native coronary artery without angina pectoris: Secondary | ICD-10-CM | POA: Diagnosis not present

## 2020-10-26 DIAGNOSIS — I130 Hypertensive heart and chronic kidney disease with heart failure and stage 1 through stage 4 chronic kidney disease, or unspecified chronic kidney disease: Secondary | ICD-10-CM | POA: Diagnosis not present

## 2020-10-26 DIAGNOSIS — Z7982 Long term (current) use of aspirin: Secondary | ICD-10-CM | POA: Diagnosis not present

## 2020-10-26 DIAGNOSIS — K219 Gastro-esophageal reflux disease without esophagitis: Secondary | ICD-10-CM | POA: Diagnosis not present

## 2020-10-26 DIAGNOSIS — Z85820 Personal history of malignant melanoma of skin: Secondary | ICD-10-CM | POA: Diagnosis not present

## 2020-10-26 DIAGNOSIS — Z8546 Personal history of malignant neoplasm of prostate: Secondary | ICD-10-CM | POA: Diagnosis not present

## 2020-10-26 DIAGNOSIS — E785 Hyperlipidemia, unspecified: Secondary | ICD-10-CM | POA: Diagnosis not present

## 2020-10-26 DIAGNOSIS — Z48815 Encounter for surgical aftercare following surgery on the digestive system: Secondary | ICD-10-CM | POA: Diagnosis not present

## 2020-10-26 DIAGNOSIS — E039 Hypothyroidism, unspecified: Secondary | ICD-10-CM | POA: Diagnosis not present

## 2020-10-26 DIAGNOSIS — N1831 Chronic kidney disease, stage 3a: Secondary | ICD-10-CM | POA: Diagnosis not present

## 2020-10-26 DIAGNOSIS — I509 Heart failure, unspecified: Secondary | ICD-10-CM | POA: Diagnosis not present

## 2020-10-26 DIAGNOSIS — I700 Atherosclerosis of aorta: Secondary | ICD-10-CM | POA: Diagnosis not present

## 2020-10-26 DIAGNOSIS — I6522 Occlusion and stenosis of left carotid artery: Secondary | ICD-10-CM | POA: Diagnosis not present

## 2020-11-03 ENCOUNTER — Ambulatory Visit (INDEPENDENT_AMBULATORY_CARE_PROVIDER_SITE_OTHER)
Admission: RE | Admit: 2020-11-03 | Discharge: 2020-11-03 | Disposition: A | Discharge status: Home / Self Care | Payer: PPO | Source: Ambulatory Visit | Attending: Gastroenterology | Admitting: Gastroenterology

## 2020-11-03 ENCOUNTER — Encounter: Payer: Self-pay | Admitting: Gastroenterology

## 2020-11-03 ENCOUNTER — Ambulatory Visit: Payer: PPO | Admitting: Gastroenterology

## 2020-11-03 ENCOUNTER — Other Ambulatory Visit (INDEPENDENT_AMBULATORY_CARE_PROVIDER_SITE_OTHER): Payer: PPO

## 2020-11-03 ENCOUNTER — Other Ambulatory Visit: Payer: Self-pay

## 2020-11-03 VITALS — BP 124/70 | HR 60 | Ht 65.5 in | Wt 142.1 lb

## 2020-11-03 DIAGNOSIS — Z9889 Other specified postprocedural states: Secondary | ICD-10-CM

## 2020-11-03 DIAGNOSIS — R194 Change in bowel habit: Secondary | ICD-10-CM | POA: Diagnosis not present

## 2020-11-03 DIAGNOSIS — Z951 Presence of aortocoronary bypass graft: Secondary | ICD-10-CM | POA: Diagnosis not present

## 2020-11-03 DIAGNOSIS — K839 Disease of biliary tract, unspecified: Secondary | ICD-10-CM

## 2020-11-03 DIAGNOSIS — T85528A Displacement of other gastrointestinal prosthetic devices, implants and grafts, initial encounter: Secondary | ICD-10-CM | POA: Diagnosis not present

## 2020-11-03 DIAGNOSIS — Z9049 Acquired absence of other specified parts of digestive tract: Secondary | ICD-10-CM

## 2020-11-03 DIAGNOSIS — M160 Bilateral primary osteoarthritis of hip: Secondary | ICD-10-CM | POA: Diagnosis not present

## 2020-11-03 DIAGNOSIS — Z9689 Presence of other specified functional implants: Secondary | ICD-10-CM | POA: Diagnosis not present

## 2020-11-03 LAB — CBC
HCT: 38 % — ABNORMAL LOW (ref 39.0–52.0)
Hemoglobin: 12.5 g/dL — ABNORMAL LOW (ref 13.0–17.0)
MCHC: 32.9 g/dL (ref 30.0–36.0)
MCV: 87.9 fl (ref 78.0–100.0)
Platelets: 167 10*3/uL (ref 150.0–400.0)
RBC: 4.32 Mil/uL (ref 4.22–5.81)
RDW: 15.7 % — ABNORMAL HIGH (ref 11.5–15.5)
WBC: 10.4 10*3/uL (ref 4.0–10.5)

## 2020-11-03 LAB — HEPATIC FUNCTION PANEL
ALT: 11 U/L (ref 0–53)
AST: 17 U/L (ref 0–37)
Albumin: 3.6 g/dL (ref 3.5–5.2)
Alkaline Phosphatase: 77 U/L (ref 39–117)
Bilirubin, Direct: 0.1 mg/dL (ref 0.0–0.3)
Total Bilirubin: 0.7 mg/dL (ref 0.2–1.2)
Total Protein: 6.5 g/dL (ref 6.0–8.3)

## 2020-11-03 LAB — COMPREHENSIVE METABOLIC PANEL
ALT: 11 U/L (ref 0–53)
AST: 17 U/L (ref 0–37)
Albumin: 3.6 g/dL (ref 3.5–5.2)
Alkaline Phosphatase: 77 U/L (ref 39–117)
BUN: 19 mg/dL (ref 6–23)
CO2: 25 mEq/L (ref 19–32)
Calcium: 9.5 mg/dL (ref 8.4–10.5)
Chloride: 111 mEq/L (ref 96–112)
Creatinine, Ser: 1.42 mg/dL (ref 0.40–1.50)
GFR: 44.43 mL/min — ABNORMAL LOW (ref >60.00)
Glucose, Bld: 104 mg/dL — ABNORMAL HIGH (ref 70–99)
Potassium: 4.1 mEq/L (ref 3.5–5.1)
Sodium: 144 mEq/L (ref 135–145)
Total Bilirubin: 0.7 mg/dL (ref 0.2–1.2)
Total Protein: 6.5 g/dL (ref 6.0–8.3)

## 2020-11-03 NOTE — Patient Instructions (Signed)
You have been scheduled for an ERCP at [facility], Separate instructions have been given  Your provider has requested that you go to the basement level for lab work before leaving today. Press "B" on the elevator. The lab is located at the first door on the left as you exit the elevator.   Your provider has requested that you have an abdominal x ray before leaving today. Please go to the basement floor to our Radiology department for the test.

## 2020-11-03 NOTE — Progress Notes (Signed)
[place] VISIT   Primary Care Provider Aletha Halim., PA-C [address] [place] [address] [phone]  Patient Profile: Juan Benjamin is a 87 y.o. male with a pmh significant for CAD, paroxysmal atrial fibrillation, hypertension, hyperlipidemia, carotid artery disease, AAA, gout, previous prostate cancer, status post cholecystectomy complicated by biliary leak and now status post ERCP with biliary stenting.  The patient presents to the [facility] for an evaluation and management of problem(s) noted below:  Problem List 1. Bile leak   2. History of biliary stent insertion   3. Presence of pancreatic duct stent   4. History of ERCP   5. History of cholecystectomy   6. Change in bowel habits     History of Present Illness This is a patient that I met in the hospital in May after presenting with acute cholecystitis with subsequent cholecystectomy leading to biliary leak.  An ERCP was performed and bile duct stent was able to be placed after cingulotomy.  A pancreatic duct stent was placed as result of his inability to have indomethacin administered for post ERCP pancreatitis prophylaxis since I entered into the pancreatic duct on 1 occasion.  Patient has been seen in follow-up by his surgery team and his drain has been removed.  He is feeling well.  Initially after his cholecystectomy, the patient had been experiencing increasing episodes of diarrhea and some incontinence but that has slowly been improving.  He has not been initiated on cholestyramine.  Prior to his gallbladder coming out he had never had any issues.  He is not interested in a colonoscopy at this time.  Patient has healed well and is not having significant amounts of abdominal pain at this time.  GI Review of Systems Positive as above Negative for pyrosis, dysphagia, odynophagia, nausea, vomiting, pain, melena, hematochezia  Review of Systems General: Denies  fevers/chills/weight loss unintentionally Cardiovascular: Denies chest pain/palpitations Pulmonary: Denies shortness of breath Gastroenterological: See HPI Genitourinary: Denies darkened urine Hematological: Denies easy bruising/bleeding Dermatological: Denies jaundice Psychological: Mood is stable   Medications Current Outpatient Medications  Medication Sig Dispense Refill   acetaminophen (TYLENOL) 325 MG tablet Take 325-650 mg by mouth every 6 (six) hours as needed for moderate pain (ear pain).      allopurinol (ZYLOPRIM) 100 MG tablet Take 100 mg by mouth at bedtime.      amLODipine (NORVASC) 5 MG tablet Take 2 tablets (10 mg total) by mouth daily. 60 tablet 0   aspirin 81 MG tablet Take 1 tablet (81 mg total) by mouth daily. 90 tablet 3   Cholecalciferol (VITAMIN D-3) 125 MCG (5000 UT) TABS Take 5,000 Units by mouth daily.     EPINEPHrine 0.3 mg/0.3 mL IJ SOAJ injection Inject 0.3 mLs (0.3 mg total) into the muscle as needed for anaphylaxis. 1 each 0   levothyroxine (SYNTHROID) 88 MCG tablet      multivitamin-lutein (OCUVITE-LUTEIN) CAPS capsule Take 1 capsule by mouth daily.     propranolol (INDERAL) 40 MG tablet Take 40 mg by mouth at bedtime.     rosuvastatin (CRESTOR) 20 MG tablet Take 1 tablet (20 mg total) by mouth daily. (Patient taking differently: Take 10 mg by mouth at bedtime.) 90 tablet 3   vitamin B-12 (CYANOCOBALAMIN) 1000 MCG tablet Take 1,000 mcg by mouth daily.     zinc oxide (BALMEX) 11.3 % CREA cream Apply 1 application topically daily as needed (pressure spot).     No current facility-administered medications for  this visit.    Allergies Allergies  Allergen Reactions   Wasp Venom Anaphylaxis   Bee Venom    Crestor [Rosuvastatin]     myalgia   Gemfibrozil Other (See Comments)    unknown   Indomethacin Other (See Comments)    unknown   Lipitor [Atorvastatin]     myalgia   Lisinopril Cough   Rofecoxib Other (See Comments)    unknown   Sildenafil Other  (See Comments)    unknown   Simvastatin Other (See Comments)    unknown    Histories Past Medical History:  Diagnosis Date   AAA (abdominal aortic aneurysm) (HCC)    Aortic stenosis    Carotid artery occlusion    Chronic occlusion of left, R CEA   Coronary artery disease    GERD (gastroesophageal reflux disease)    Hyperlipidemia    Hypertension    Hypothyroidism    PAF (paroxysmal atrial fibrillation) (HCC)    Pneumonia    Prostate cancer [facility]    Past Surgical History:  Procedure Laterality Date   BILIARY STENT PLACEMENT  09/29/2020   Procedure: BILIARY STENT PLACEMENT;  Surgeon: Irving Copas., MD;  Location: [facility] ENDOSCOPY;  Service: Gastroenterology;;   BIOPSY  09/29/2020   Procedure: BIOPSY;  Surgeon: Irving Copas., MD;  Location: [place];  Service: Gastroenterology;;   CAROTID ARTERY SURGERY     Right    CHOLECYSTECTOMY N/A 09/26/2020   Procedure: LAPAROSCOPIC SUBTOTAL CHOLECYSTECTOMY;  Surgeon: Jesusita Oka, MD;  Location: [place];  Service: General;  Laterality: N/A;   CORONARY ARTERY BYPASS GRAFT     CABG (4 vessel, 2000.  No report in [place])   ENDOSCOPIC RETROGRADE CHOLANGIOPANCREATOGRAPHY (ERCP) WITH PROPOFOL N/A 09/29/2020   Procedure: ENDOSCOPIC RETROGRADE CHOLANGIOPANCREATOGRAPHY (ERCP) WITH PROPOFOL;  Surgeon: Rush Landmark Telford Nab., MD;  Location: [place];  Service: Gastroenterology;  Laterality: N/A;   MELANOMA EXCISION WITH SENTINEL LYMPH NODE BIOPSY Left 03/21/2020   Procedure: WIDE LOCAL EXCISION LEFT UPPER ARM MELANOMA ADVANCEMENT FLAP CLOSURE FOR DEFECT 4X8CM SENTINEL LYMPH NODE MAPPING AND BIOPSY;  Surgeon: Stark Klein, MD;  Location: [place];  Service: General;  Laterality: Left;   PANCREATIC STENT PLACEMENT  09/29/2020   Procedure: PANCREATIC STENT PLACEMENT;  Surgeon: Irving Copas., MD;  Location: [place];  Service: Gastroenterology;;   REMOVAL OF STONES  09/29/2020   Procedure: REMOVAL OF STONES;  Surgeon:  Irving Copas., MD;  Location: [place];  Service: Gastroenterology;;   RENAL ARTERY STENT  07/02/1999   RIGHT SIDE   SPHINCTEROTOMY  09/29/2020   Procedure: SPHINCTEROTOMY;  Surgeon: Mansouraty, Telford Nab., MD;  Location: [facility] ENDOSCOPY;  Service: Gastroenterology;;   Social History   Socioeconomic History   Marital status: Single    Spouse name: Not on file   Number of children: Not on file   Years of education: Not on file   Highest education level: Not on file  Occupational History   Occupation: Retired  Tobacco Use   Smoking status: Former    Pack years: 0.00    Types: Cigarettes    Quit date: 12/12/1980    Years since quitting: 39.9   Smokeless tobacco: Never  Vaping Use   Vaping Use: Never used  Substance and Sexual Activity   Alcohol use: No   Drug use: No   Sexual activity: Not on file  Other Topics Concern   Not on file  Social History Narrative   Lives at home with wife.  1 son  Social Determinants of Health   Financial Resource Strain: Not on file  Food Insecurity: Not on file  Transportation Needs: Not on file  Physical Activity: Not on file  Stress: Not on file  Social Connections: Not on file  Intimate Partner Violence: Not on file   Family History  Problem Relation Age of Onset   Bone cancer Mother    Colon cancer Neg Hx    Esophageal cancer Neg Hx    Inflammatory bowel disease Neg Hx    Liver disease Neg Hx    Pancreatic cancer Neg Hx    Rectal cancer Neg Hx    Stomach cancer Neg Hx    I have reviewed his medical, social, and family history in detail and updated the electronic medical record as necessary.    PHYSICAL EXAMINATION  BP 124/70 (BP Location: Left Arm, Patient Position: Sitting, Cuff Size: Normal)   Pulse 60 Comment: irregular  Ht 5' 5.5" (1.664 m) Comment: height measured without shoes  Wt 142 lb 2 oz (64.5 kg)   BMI 23.29 kg/m  Wt Readings from Last 3 Encounters:  11/03/20 142 lb 2 oz (64.5 kg)  10/17/20 138 lb  6.4 oz (62.8 kg)  09/29/20 145 lb 1 oz (65.8 kg)  GEN: NAD, appears younger than stated age, doesn't appear chronically ill or toxic PSYCH: Cooperative, without pressured speech EYE: Conjunctivae pink, sclerae anicteric ENT: MMM CV: Nontachycardic RESP: No audible wheezing GI: NABS, soft, well-healed surgical scars, NT/ND, without rebound or guarding, no HSM appreciated MSK/EXT: No lower extremity edema SKIN: No jaundice NEURO:  Alert & Oriented x 3, no focal deficits   REVIEW OF DATA  I reviewed the following data at the time of this encounter:  GI Procedures and Studies  May 2022 ERCP - Hiatal hernia. - J-shaped deformity. Gastritis. Biopsied. - No gross lesions in the duodenal bulb, in the first portion of the duodenum and in the second portion of the duodenum. - The major papilla appeared normal. - Double wire technique as described above was performed for biliary cannulation. - The fluoroscopic examination was suspicious for sludge with the middle third of the main bile duct and lower third of the main bile duct being moderately dilated. - A bile leak was found from the cystic duct region. - A biliary sphincterotomy was performed. - The biliary tree was swept and sludge was found. - One temporary plastic pancreatic stent was placed into the ventral pancreatic duct to decrease PEP. - One plastic biliary stent was placed into the common bile duct to aid in healing of the bile duct leak. Pathology FINAL MICROSCOPIC DIAGNOSIS:  A. STOMACH, BIOPSY:  - Gastric antral and oxyntic mucosa with slight chronic inflammation.  - Warthin-Starry negative for Helicobacter pylori.  - No intestinal metaplasia, dysplasia or carcinoma.  Laboratory Studies  Reviewed those in epic  Imaging Studies  Oct 03, 2020 CT abdomen pelvis without contrast IMPRESSION: 1. 6.1 x 2.4 x 3.3 cm fluid collection in the gallbladder fossa status post cholecystectomy with JP-drain placement. JP drain  is position within this collection. 2. Common bile duct and pancreatic duct stents in situ without intra or extrahepatic biliary duct dilatation. 3. Atrophic right kidney with multiple bilateral renal cysts. 4. 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 5. Aortic Atherosclerosis (ICD10-I70.0).   ASSESSMENT  Juan Benjamin is a 87 y.o. malewith a pmh significant for CAD, paroxysmal atrial fibrillation, hypertension, hyperlipidemia, carotid artery disease, AAA, gout, previous prostate cancer, status post cholecystectomy complicated  by biliary leak and now status post ERCP with biliary stenting.  The patient is seen today for evaluation and management of:  1. Bile leak   2. History of biliary stent insertion   3. Presence of pancreatic duct stent   4. History of ERCP   5. History of cholecystectomy   6. Change in bowel habits    The patient is clinically and hemodynamically stable.  His JP drain is out.  He is not having abdominal pain.  Seems that the biliary leak has probably sealed off or continues to be in good position with the current biliary stent.  We will plan to remove this in the course of the next 6 weeks and perform a final cholangiogram and cleanout to ensure no evidence of persistent leak is present.  I suspect his change in bowel habits is a result of his recent cholecystectomy and it seems that the patient is having improvement as time is passing.  We may need to consider a bile salt resin at some point [place].  We are going to obtain a KUB today to ensure that his PD stent has fallen out, because if it has not we will have to likely perform his repeat procedure sooner rather than later.  The risks and benefits of endoscopic evaluation were discussed with the patient; these include but are not limited to the risk of perforation, infection, bleeding, missed lesions, lack of diagnosis, severe illness requiring hospitalization, as well as anesthesia and sedation related  illnesses.  The patient is agreeable to proceed.  All patient questions were answered to the best of my ability, and the patient agrees to the aforementioned plan of action with follow-up as indicated.   PLAN  Laboratories as outlined below KUB to be obtained today Proceed with scheduling ERCP with bile duct stent removal and cholangiogram/clean If PD stent remains in place we will need to proceed with earlier ERCP for PD stent removal   Orders Placed This Encounter  Procedures   Procedural/ Surgical Case Request: ENDOSCOPIC RETROGRADE CHOLANGIOPANCREATOGRAPHY (ERCP) WITH PROPOFOL   DG Abd 2 Views   CBC   Comp Met (CMET)   Ambulatory referral to Gastroenterology    New Prescriptions   No medications on file   Modified Medications   No medications on file    Planned Follow Up No follow-ups on file.   Total Time in Face-to-Face and in Coordination of Care for patient including independent/personal interpretation/review of prior testing, medical history, examination, medication adjustment, communicating results with the patient directly, and documentation with the EHR is 30 minutes.   [name], MD [facility] Office # [phone]

## 2020-11-06 ENCOUNTER — Other Ambulatory Visit (INDEPENDENT_AMBULATORY_CARE_PROVIDER_SITE_OTHER): Payer: PPO

## 2020-11-06 DIAGNOSIS — D649 Anemia, unspecified: Secondary | ICD-10-CM

## 2020-11-06 LAB — IBC + FERRITIN
Ferritin: 115.3 ng/mL (ref 22.0–322.0)
Iron: 25 ug/dL — ABNORMAL LOW (ref 42–165)
Saturation Ratios: 9.1 % — ABNORMAL LOW (ref 20.0–50.0)
Transferrin: 196 mg/dL — ABNORMAL LOW (ref 212.0–360.0)

## 2020-11-06 LAB — VITAMIN B12: Vitamin B-12: 580 pg/mL (ref 211–911)

## 2020-11-06 LAB — FOLATE: Folate: 17.8 ng/mL (ref >5.9)

## 2020-11-07 ENCOUNTER — Other Ambulatory Visit: Payer: Self-pay

## 2020-11-07 DIAGNOSIS — D509 Iron deficiency anemia, unspecified: Secondary | ICD-10-CM

## 2020-11-09 DIAGNOSIS — E7800 Pure hypercholesterolemia, unspecified: Secondary | ICD-10-CM | POA: Diagnosis not present

## 2020-11-09 DIAGNOSIS — E039 Hypothyroidism, unspecified: Secondary | ICD-10-CM | POA: Diagnosis not present

## 2020-11-09 DIAGNOSIS — M109 Gout, unspecified: Secondary | ICD-10-CM | POA: Diagnosis not present

## 2020-11-09 DIAGNOSIS — I10 Essential (primary) hypertension: Secondary | ICD-10-CM | POA: Diagnosis not present

## 2020-11-16 DIAGNOSIS — I2510 Atherosclerotic heart disease of native coronary artery without angina pectoris: Secondary | ICD-10-CM | POA: Diagnosis not present

## 2020-11-16 DIAGNOSIS — I129 Hypertensive chronic kidney disease with stage 1 through stage 4 chronic kidney disease, or unspecified chronic kidney disease: Secondary | ICD-10-CM | POA: Diagnosis not present

## 2020-11-16 DIAGNOSIS — I350 Nonrheumatic aortic (valve) stenosis: Secondary | ICD-10-CM | POA: Diagnosis not present

## 2020-11-16 DIAGNOSIS — E039 Hypothyroidism, unspecified: Secondary | ICD-10-CM | POA: Diagnosis not present

## 2020-11-16 DIAGNOSIS — E7800 Pure hypercholesterolemia, unspecified: Secondary | ICD-10-CM | POA: Diagnosis not present

## 2020-11-16 DIAGNOSIS — N1832 Chronic kidney disease, stage 3b: Secondary | ICD-10-CM | POA: Diagnosis not present

## 2020-11-16 DIAGNOSIS — I6522 Occlusion and stenosis of left carotid artery: Secondary | ICD-10-CM | POA: Diagnosis not present

## 2020-11-16 DIAGNOSIS — M109 Gout, unspecified: Secondary | ICD-10-CM | POA: Diagnosis not present

## 2020-12-04 ENCOUNTER — Other Ambulatory Visit: Payer: Self-pay

## 2020-12-04 DIAGNOSIS — I714 Abdominal aortic aneurysm, without rupture, unspecified: Secondary | ICD-10-CM

## 2020-12-11 ENCOUNTER — Other Ambulatory Visit: Payer: Self-pay

## 2020-12-15 ENCOUNTER — Other Ambulatory Visit: Payer: Self-pay

## 2020-12-15 ENCOUNTER — Ambulatory Visit (HOSPITAL_COMMUNITY): Payer: PPO

## 2020-12-15 ENCOUNTER — Encounter (HOSPITAL_COMMUNITY): Payer: Self-pay | Admitting: Gastroenterology

## 2020-12-15 ENCOUNTER — Ambulatory Visit (HOSPITAL_COMMUNITY): Payer: PPO | Admitting: Certified Registered Nurse Anesthetist

## 2020-12-15 ENCOUNTER — Encounter (HOSPITAL_COMMUNITY)
Admission: RE | Disposition: A | Discharge status: Home / Self Care | Payer: Self-pay | Source: Home / Self Care | Attending: Gastroenterology

## 2020-12-15 ENCOUNTER — Ambulatory Visit (HOSPITAL_COMMUNITY)
Admission: RE | Admit: 2020-12-15 | Discharge: 2020-12-15 | Disposition: A | Discharge status: Home / Self Care | Payer: PPO | Attending: Gastroenterology | Admitting: Gastroenterology

## 2020-12-15 DIAGNOSIS — Z79899 Other long term (current) drug therapy: Secondary | ICD-10-CM | POA: Insufficient documentation

## 2020-12-15 DIAGNOSIS — Z886 Allergy status to analgesic agent status: Secondary | ICD-10-CM | POA: Diagnosis not present

## 2020-12-15 DIAGNOSIS — K839 Disease of biliary tract, unspecified: Secondary | ICD-10-CM

## 2020-12-15 DIAGNOSIS — Z951 Presence of aortocoronary bypass graft: Secondary | ICD-10-CM | POA: Diagnosis not present

## 2020-12-15 DIAGNOSIS — Z8546 Personal history of malignant neoplasm of prostate: Secondary | ICD-10-CM | POA: Diagnosis not present

## 2020-12-15 DIAGNOSIS — Z9049 Acquired absence of other specified parts of digestive tract: Secondary | ICD-10-CM | POA: Insufficient documentation

## 2020-12-15 DIAGNOSIS — K3189 Other diseases of stomach and duodenum: Secondary | ICD-10-CM

## 2020-12-15 DIAGNOSIS — Z4659 Encounter for fitting and adjustment of other gastrointestinal appliance and device: Secondary | ICD-10-CM | POA: Diagnosis not present

## 2020-12-15 DIAGNOSIS — K8050 Calculus of bile duct without cholangitis or cholecystitis without obstruction: Secondary | ICD-10-CM

## 2020-12-15 DIAGNOSIS — Z87891 Personal history of nicotine dependence: Secondary | ICD-10-CM | POA: Diagnosis not present

## 2020-12-15 DIAGNOSIS — K9171 Accidental puncture and laceration of a digestive system organ or structure during a digestive system procedure: Secondary | ICD-10-CM | POA: Diagnosis not present

## 2020-12-15 DIAGNOSIS — Z888 Allergy status to other drugs, medicaments and biological substances status: Secondary | ICD-10-CM | POA: Diagnosis not present

## 2020-12-15 DIAGNOSIS — K8020 Calculus of gallbladder without cholecystitis without obstruction: Secondary | ICD-10-CM

## 2020-12-15 DIAGNOSIS — K9189 Other postprocedural complications and disorders of digestive system: Secondary | ICD-10-CM | POA: Insufficient documentation

## 2020-12-15 DIAGNOSIS — K2970 Gastritis, unspecified, without bleeding: Secondary | ICD-10-CM | POA: Diagnosis not present

## 2020-12-15 DIAGNOSIS — K838 Other specified diseases of biliary tract: Secondary | ICD-10-CM | POA: Insufficient documentation

## 2020-12-15 DIAGNOSIS — Y838 Other surgical procedures as the cause of abnormal reaction of the patient, or of later complication, without mention of misadventure at the time of the procedure: Secondary | ICD-10-CM | POA: Insufficient documentation

## 2020-12-15 DIAGNOSIS — Z09 Encounter for follow-up examination after completed treatment for conditions other than malignant neoplasm: Secondary | ICD-10-CM | POA: Diagnosis present

## 2020-12-15 DIAGNOSIS — Z85820 Personal history of malignant melanoma of skin: Secondary | ICD-10-CM | POA: Diagnosis not present

## 2020-12-15 HISTORY — PX: STENT REMOVAL: SHX6421

## 2020-12-15 HISTORY — PX: REMOVAL OF STONES: SHX5545

## 2020-12-15 HISTORY — PX: ENDOSCOPIC RETROGRADE CHOLANGIOPANCREATOGRAPHY (ERCP) WITH PROPOFOL: SHX5810

## 2020-12-15 HISTORY — PX: HEMOSTASIS CLIP PLACEMENT: SHX6857

## 2020-12-15 HISTORY — PX: BILIARY STENT PLACEMENT: SHX5538

## 2020-12-15 SURGERY — ENDOSCOPIC RETROGRADE CHOLANGIOPANCREATOGRAPHY (ERCP) WITH PROPOFOL
Anesthesia: General

## 2020-12-15 MED ORDER — PHENYLEPHRINE HCL (PRESSORS) 10 MG/ML IV SOLN
INTRAVENOUS | Status: AC
  Filled 2020-12-15: qty 1

## 2020-12-15 MED ORDER — CIPROFLOXACIN HCL 500 MG PO TABS: 500.0000 mg | ORAL_TABLET | Freq: Two times a day (BID) | ORAL | Status: DC

## 2020-12-15 MED ORDER — CIPROFLOXACIN IN D5W 400 MG/200ML IV SOLN
INTRAVENOUS | Status: DC | PRN
  Administered 2020-12-15: 400 mg via INTRAVENOUS

## 2020-12-15 MED ORDER — CIPROFLOXACIN HCL 500 MG PO TABS: 500.0000 mg | ORAL_TABLET | Freq: Two times a day (BID) | ORAL | 0 refills | Status: AC

## 2020-12-15 MED ORDER — ROCURONIUM BROMIDE 100 MG/10ML IV SOLN
INTRAVENOUS | Status: DC | PRN
  Administered 2020-12-15: 50 mg via INTRAVENOUS

## 2020-12-15 MED ORDER — LACTATED RINGERS IV SOLN
INTRAVENOUS | Status: AC | PRN
  Administered 2020-12-15: 1000 mL via INTRAVENOUS

## 2020-12-15 MED ORDER — DEXAMETHASONE SODIUM PHOSPHATE 10 MG/ML IJ SOLN
INTRAMUSCULAR | Status: DC | PRN
  Administered 2020-12-15: 5 mg via INTRAVENOUS

## 2020-12-15 MED ORDER — CIPROFLOXACIN IN D5W 400 MG/200ML IV SOLN
INTRAVENOUS | Status: AC
  Filled 2020-12-15: qty 200

## 2020-12-15 MED ORDER — GLUCAGON HCL RDNA (DIAGNOSTIC) 1 MG IJ SOLR
INTRAMUSCULAR | Status: AC
  Filled 2020-12-15: qty 1

## 2020-12-15 MED ORDER — SUGAMMADEX SODIUM 200 MG/2ML IV SOLN
INTRAVENOUS | Status: DC | PRN
  Administered 2020-12-15: 200 mg via INTRAVENOUS

## 2020-12-15 MED ORDER — EPHEDRINE SULFATE 50 MG/ML IJ SOLN
INTRAMUSCULAR | Status: DC | PRN
  Administered 2020-12-15: 5 mg via INTRAVENOUS

## 2020-12-15 MED ORDER — SODIUM CHLORIDE 0.9 % IV SOLN: INTRAVENOUS | Status: DC

## 2020-12-15 MED ORDER — ONDANSETRON HCL 4 MG/2ML IJ SOLN
INTRAMUSCULAR | Status: DC | PRN
  Administered 2020-12-15: 4 mg via INTRAVENOUS

## 2020-12-15 MED ORDER — FENTANYL CITRATE (PF) 100 MCG/2ML IJ SOLN
INTRAMUSCULAR | Status: AC
  Filled 2020-12-15: qty 2

## 2020-12-15 MED ORDER — GLUCAGON HCL RDNA (DIAGNOSTIC) 1 MG IJ SOLR
INTRAMUSCULAR | Status: DC | PRN
  Administered 2020-12-15 (×2): .25 mg via INTRAVENOUS

## 2020-12-15 MED ORDER — PHENYLEPHRINE HCL-NACL 10-0.9 MG/250ML-% IV SOLN
INTRAVENOUS | Status: DC | PRN
  Administered 2020-12-15: 25 ug/min via INTRAVENOUS

## 2020-12-15 MED ORDER — LIDOCAINE HCL (CARDIAC) PF 100 MG/5ML IV SOSY
PREFILLED_SYRINGE | INTRAVENOUS | Status: DC | PRN
  Administered 2020-12-15: 100 mg via INTRAVENOUS

## 2020-12-15 MED ORDER — SODIUM CHLORIDE 0.9 % IV SOLN
INTRAVENOUS | Status: DC | PRN
  Administered 2020-12-15: 42 mL

## 2020-12-15 MED ORDER — PROPOFOL 10 MG/ML IV BOLUS
INTRAVENOUS | Status: AC
  Filled 2020-12-15: qty 40

## 2020-12-15 MED ORDER — PROPOFOL 10 MG/ML IV BOLUS
INTRAVENOUS | Status: DC | PRN
  Administered 2020-12-15: 70 mg via INTRAVENOUS

## 2020-12-15 NOTE — Transfer of Care (Signed)
Immediate Anesthesia Transfer of Care Note  Patient: Juan Benjamin [facility]  Procedure(s) Performed: ENDOSCOPIC RETROGRADE CHOLANGIOPANCREATOGRAPHY (ERCP) WITH PROPOFOL BILIARY STENT PLACEMENT HEMOSTASIS CLIP PLACEMENT REMOVAL OF STONES STENT REMOVAL  Patient Location: PACU and Endoscopy Unit  Anesthesia Type:General  Level of Consciousness: awake and drowsy  Airway & Oxygen Therapy: Patient Spontanous Breathing and Patient connected to face mask oxygen  Post-op Assessment: Report given to RN and Post -op Vital signs reviewed and stable  Post vital signs: Reviewed and stable  Last Vitals:  Vitals Value Taken Time  BP 119/47 12/15/20 1500  Temp    Pulse    Resp 23 12/15/20 1500  SpO2 100 % 12/15/20 1500    Last Pain:  Vitals:   12/15/20 1211  TempSrc: Temporal  PainSc: 0-No pain         Complications: No notable events documented.

## 2020-12-15 NOTE — Anesthesia Postprocedure Evaluation (Signed)
Anesthesia Post Note  Patient: Juan Benjamin  Procedure(s) Performed: ENDOSCOPIC RETROGRADE CHOLANGIOPANCREATOGRAPHY (ERCP) WITH PROPOFOL BILIARY STENT PLACEMENT HEMOSTASIS CLIP PLACEMENT REMOVAL OF STONES STENT REMOVAL     Patient location during evaluation: Endoscopy Anesthesia Type: General Level of consciousness: awake and alert, patient cooperative and oriented Pain management: pain level controlled Vital Signs Assessment: post-procedure vital signs reviewed and stable Respiratory status: spontaneous breathing, nonlabored ventilation, respiratory function stable and patient connected to nasal cannula oxygen Cardiovascular status: blood pressure returned to baseline and stable Postop Assessment: no apparent nausea or vomiting Anesthetic complications: no   No notable events documented.  Last Vitals:  Vitals:   12/15/20 1500 12/15/20 1510  BP: (!) 119/47 (!) 111/44  Resp: (!) 23 (!) 21  Temp: 36.6 C   SpO2: 100% 100%    Last Pain:  Vitals:   12/15/20 1510  TempSrc:   PainSc: 0-No pain                 Kathryn Linarez,E. Ahria Slappey

## 2020-12-15 NOTE — Op Note (Signed)
[facility] Patient Name: Juan Benjamin Procedure Date: 12/15/2020 MRN: [id number] Attending MD: [name] , MD Date of Birth: 24-Mar-1931 CSN: [id number] Age: 87 Admit Type: Outpatient Procedure:                ERCP Indications:              Bile leak, Biliary stent removal, Postop exam:                            Subtotal Cholecystectomy for gangrenous                            cholecystitis, Prior Endoscopic Retrograde                            Cholangiopancreatography Providers:                [name], MD, Kary Kos RN, RN,                            Janee Morn, Technician, Tyna Jaksch                            Technician Referring MD:             Jesusita Oka, Aletha Halim PA-C, PA-C Medicines:                General Anesthesia, Cipro 400 mg IV, Glucagon 0.5                            mg IV, Indomethacin not administered due to allergy Complications:            Tear Estimated Blood Loss:     Estimated blood loss: none. Procedure:                Pre-Anesthesia Assessment:                           - Prior to the procedure, a History and Physical                            was performed, and patient medications and                            allergies were reviewed. The patient's tolerance of                            previous anesthesia was also reviewed. The risks                            and benefits of the procedure and the sedation                            options and risks were discussed with the patient.                            All questions were  answered, and informed consent                            was obtained. Prior Anticoagulants: The patient has                            taken no previous anticoagulant or antiplatelet                            agents except for aspirin. ASA Grade Assessment:                            III - A patient with severe systemic disease. After                             reviewing the risks and benefits, the patient was                            deemed in satisfactory condition to undergo the                            procedure.                           After obtaining informed consent, the scope was                            passed under direct vision. Throughout the                            procedure, the patient's blood pressure, pulse, and                            oxygen saturations were monitored continuously. The                            TJF-Q180V ([id number]) Olympus Duodenoscope was                            introduced through the mouth, and used to inject                            contrast into and used to inject contrast into the                            bile duct. The ERCP was accomplished without                            difficulty. The patient tolerated the procedure.                            The GIF-H190 ([id number]) Olympus gastroscope was  introduced through the mouth, and used to inject                            contrast into and used to locate the major and                            minor papilla. The PCF-H190DL ([id number]) Olympus                            pediatric colonscope was introduced through the                            mouth, and used to inject contrast into and used to                            locate the major and minor papilla. Scope In: Scope Out: Findings:      A biliary stent was visible on the scout film.      The upper GI tract was traversed under direct vision without detailed       examination. Patchy moderate inflammation characterized by erythema was       found in the entire examined stomach - previously biopsied. 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 No gross       lesions were noted in the duodenal bulb, in the first portion of the       duodenum and in  the second portion of the duodenum. A biliary       sphincterotomy had been performed. The sphincterotomy appeared open. One       plastic biliary stent originating in the biliary tree was emerging from       the major papilla. The stent was visibly patent. One stent was removed       from the biliary tree using a snare.      A short 0.035 inch Soft Jagwire was passed into the biliary tree. The       Hydratome sphincterotome was passed over the guidewire and the bile duct       was then deeply cannulated. Contrast was injected. I personally       interpreted the bile duct images. Ductal flow of contrast was adequate.       Image quality was adequate. Contrast extended to the entire biliary       tree. Opacification of the entire opacified area was successful. The       main bile duct was moderately dilated. The largest diameter was 10 mm.       Extravasation of contrast originating from the remnant gallbladder was       observed. To discover objects, the biliary tree was swept with a       retrieval balloon starting at the bifurcation. Sludge was swept from the       duct. One stone was removed. No stones remained. An occlusion       cholangiogram was performed that showed extravasation of contrast       originating from the remnant gallbladder. One 10 mm by 6 cm fully  covered metal biliary stent was placed into the common bile duct. Bile       flowed through the stent. The stent was in good position.      After completion of placement of the biliary stent successfully, a       non-bleeding mucosal tear was found at the opposite wall of the major       papilla. This measured 10 mm in length. There was no clear perforation.       To prevent bleeding and close defect, three hemostatic clips were       successfully placed (MR conditional). There was no bleeding during, or       at the end, of the procedure.      A pancreatogram was not performed.      The duodenoscope and pediatric  colonoscope was withdrawn from the       patient. Impression:               - Gastritis - previously biopsied.                           - J-shaped deformity of stomach - requires left                            lateral positioning to allow duodenal evaluation.                           - No gross lesions in the duodenal bulb, in the                            first portion of the duodenum and in the second                            portion of the duodenum.                           - Prior biliary sphincterotomy appeared open.                           - One visibly patent stent from the biliary tree                            was seen in the major papilla. This was removed.                           - The entire main bile duct was moderately dilated.                           - A bile leak was found from the remnant                            gallbladder with extravasation to the right upper                            quadrant.                           -  Single choledocholithiasis was found. Complete                            removal was accomplished by sweeping.                           - One fully covered metal biliary stent was placed                            into the common bile duct to traverse the distal                            biliary tree with hope that it would cover the                            cystic duct to allow for further healing and                            closure of the biliary leak.                           - After completion of the ERCP, a mucosal tear on                            the opposite wall of the major papilla was seen. No                            clear perforation noted. Three hemostatic clips                            were successfully placed (MR conditional) to close                            the defect.                           - There is risk in setting of subtotal                            cholecystectomy and fully covered stent  closure,                            that patients can develop cholecystitis due to the                            remnant gallbladder. Hopefully, this will not be                            the case and the leak seals off and follow up ERCP                            does not show any evidence of persistence at this  point (did not think it would be seen today since                            he had his drain removed weeks ago but certainly                            present). The entire biliary tree is dilated, and                            although the 10 mm stent is in place, need to be                            mindful of pancreatic duct pressure that could lead                            to pancreatitis, but also the chance that the                            patient could have stent migration due to the                            entire biliary tree already being approximately 10                            mm in size. Moderate Sedation:      Not Applicable - Patient had care per Anesthesia. Recommendation:           - The patient will be observed post-procedure,                            until all discharge criteria are met.                           - Discharge patient to home.                           - Patient has a contact number available for                            emergencies. The signs and symptoms of potential                            delayed complications were discussed with the                            patient. Return to normal activities tomorrow.                            Written discharge instructions were provided to the                            patient.                           -  Low fat diet.                           - Observe patient's clinical course.                           - Check liver enzymes (AST, ALT, alkaline                            phosphatase, bilirubin) in 1-2 weeks.                           - Watch for  pancreatitis, bleeding, perforation,                            and cholangitis.                           - Repeat ERCP in 3 months to remove stent.                           - The findings and recommendations were discussed                            with the patient.                           - The findings and recommendations were discussed                            with the patient's family. Procedure Code(s):        --- Professional ---                           [phone], Endoscopic retrograde                            cholangiopancreatography (ERCP); with removal and                            exchange of stent(s), biliary or pancreatic duct,                            including pre- and post-dilation and guide wire                            passage, when performed, including sphincterotomy,                            when performed, each stent exchanged                           [id number], Endoscopic retrograde                            cholangiopancreatography (ERCP); with removal of  calculi/debris from biliary/pancreatic duct(s) Diagnosis Code(s):        --- Professional ---                           K29.70, Gastritis, unspecified, without bleeding                           K31.89, Other diseases of stomach and duodenum                           Z96.89, Presence of other specified functional                            implants                           S36.490A, Other injury of duodenum, initial                            encounter                           K83.8, Other specified diseases of biliary tract                           K83.9, Disease of biliary tract, unspecified                           K80.50, Calculus of bile duct without cholangitis                            or cholecystitis without obstruction                           Z46.59, Encounter for fitting and adjustment of                            other gastrointestinal appliance and  device                           Z09, Encounter for follow-up examination after                            completed treatment for conditions other than                            malignant neoplasm                           Z90.49, Acquired absence of other specified parts                            of digestive tract                           Z98.890, Other specified postprocedural states CPT copyright 2019 [employer]. All rights reserved. The codes documented in this report are preliminary  and upon coder review may  be revised to meet current compliance requirements. [name], MD 12/15/2020 3:12:56 PM Number of Addenda: 0

## 2020-12-15 NOTE — H&P (Signed)
GASTROENTEROLOGY PROCEDURE H&P NOTE   Primary Care Physician: Aletha Halim., PA-C  HPI: Juan Benjamin is a 87 y.o. male who presents for ERCP biliary leak follow up.  Past Medical History:  Diagnosis Date   AAA (abdominal aortic aneurysm) (HCC)    Aortic stenosis    Carotid artery occlusion    Chronic occlusion of left, R CEA   Coronary artery disease    GERD (gastroesophageal reflux disease)    Hyperlipidemia    Hypertension    Hypothyroidism    PAF (paroxysmal atrial fibrillation) (HCC)    Pneumonia    Prostate cancer [facility])    Past Surgical History:  Procedure Laterality Date   BILIARY STENT PLACEMENT  09/29/2020   Procedure: BILIARY STENT PLACEMENT;  Surgeon: Irving Copas., MD;  Location: [place];  Service: Gastroenterology;;   BIOPSY  09/29/2020   Procedure: BIOPSY;  Surgeon: Irving Copas., MD;  Location: [place];  Service: Gastroenterology;;   CAROTID ARTERY SURGERY     Right    CHOLECYSTECTOMY N/A 09/26/2020   Procedure: LAPAROSCOPIC SUBTOTAL CHOLECYSTECTOMY;  Surgeon: Jesusita Oka, MD;  Location: [place];  Service: General;  Laterality: N/A;   CORONARY ARTERY BYPASS GRAFT     CABG (4 vessel, 2000.  No report in [place])   ENDOSCOPIC RETROGRADE CHOLANGIOPANCREATOGRAPHY (ERCP) WITH PROPOFOL N/A 09/29/2020   Procedure: ENDOSCOPIC RETROGRADE CHOLANGIOPANCREATOGRAPHY (ERCP) WITH PROPOFOL;  Surgeon: Rush Landmark Telford Nab., MD;  Location: [place];  Service: Gastroenterology;  Laterality: N/A;   MELANOMA EXCISION WITH SENTINEL LYMPH NODE BIOPSY Left 03/21/2020   Procedure: WIDE LOCAL EXCISION LEFT UPPER ARM MELANOMA ADVANCEMENT FLAP CLOSURE FOR DEFECT 4X8CM SENTINEL LYMPH NODE MAPPING AND BIOPSY;  Surgeon: Stark Klein, MD;  Location: [place];  Service: General;  Laterality: Left;   PANCREATIC STENT PLACEMENT  09/29/2020   Procedure: PANCREATIC STENT PLACEMENT;  Surgeon: Irving Copas., MD;  Location: [place];  Service:  Gastroenterology;;   REMOVAL OF STONES  09/29/2020   Procedure: REMOVAL OF STONES;  Surgeon: Irving Copas., MD;  Location: [place];  Service: Gastroenterology;;   RENAL ARTERY STENT  07/02/1999   RIGHT SIDE   SPHINCTEROTOMY  09/29/2020   Procedure: SPHINCTEROTOMY;  Surgeon: Mansouraty, Telford Nab., MD;  Location: [place];  Service: Gastroenterology;;   No current facility-administered medications for this encounter.   Allergies  Allergen Reactions   Bee Venom Anaphylaxis   Wasp Venom Anaphylaxis   Crestor [Rosuvastatin] Other (See Comments)    myalgia   Gemfibrozil Other (See Comments)    unknown   Indomethacin Other (See Comments)    unknown   Lipitor [Atorvastatin]     myalgia   Lisinopril Cough   Rofecoxib Other (See Comments)    unknown   Sildenafil Other (See Comments)    unknown   Simvastatin Other (See Comments)    unknown   Family History  Problem Relation Age of Onset   Bone cancer Mother    Colon cancer Neg Hx    Esophageal cancer Neg Hx    Inflammatory bowel disease Neg Hx    Liver disease Neg Hx    Pancreatic cancer Neg Hx    Rectal cancer Neg Hx    Stomach cancer Neg Hx    Social History   Socioeconomic History   Marital status: Single    Spouse name: Not on file   Number of children: Not on file   Years of education: Not on file   Highest education level: Not  on file  Occupational History   Occupation: Retired  Tobacco Use   Smoking status: Former    Types: Cigarettes    Quit date: 12/12/1980    Years since quitting: 40.0   Smokeless tobacco: Never  Vaping Use   Vaping Use: Never used  Substance and Sexual Activity   Alcohol use: No   Drug use: No   Sexual activity: Not on file  Other Topics Concern   Not on file  Social History Narrative   Lives at home with wife.  1 son   Social Determinants of Radio broadcast assistant Strain: Not on file  Food Insecurity: Not on file  Transportation Needs: Not on file  Physical  Activity: Not on file  Stress: Not on file  Social Connections: Not on file  Intimate Partner Violence: Not on file    Physical Exam: Vital signs in last 24 hours:     GEN: NAD EYE: Sclerae anicteric ENT: MMM CV: Non-tachycardic GI: Soft, NT/ND NEURO:  Alert & Oriented x 3  Lab Results: No results for input(s): WBC, HGB, HCT, PLT in the last 72 hours. BMET No results for input(s): NA, K, CL, CO2, GLUCOSE, BUN, CREATININE, CALCIUM in the last 72 hours. LFT No results for input(s): PROT, ALBUMIN, AST, ALT, ALKPHOS, BILITOT, BILIDIR, IBILI in the last 72 hours. PT/INR No results for input(s): LABPROT, INR in the last 72 hours.   Impression / Plan: This is a 87 y.o.male who presents for ERCP biliary leak follow up.  The risks of an ERCP were discussed at length, including but not limited to the risk of perforation, bleeding, abdominal pain, post-ERCP pancreatitis (while usually mild can be severe and even life threatening).  The risks and benefits of endoscopic evaluation were discussed with the patient; these include but are not limited to the risk of perforation, infection, bleeding, missed lesions, lack of diagnosis, severe illness requiring hospitalization, as well as anesthesia and sedation related illnesses.  The patient is agreeable to proceed.    [name], MD [facility] # [phone]

## 2020-12-15 NOTE — Anesthesia Procedure Notes (Signed)
Procedure Name: Intubation Date/Time: 12/15/2020 1:45 PM Performed by: Jari Pigg, CRNA Pre-anesthesia Checklist: Patient identified, Emergency Drugs available, Suction available and Patient being monitored Patient Re-evaluated:Patient Re-evaluated prior to induction Oxygen Delivery Method: Circle system utilized Preoxygenation: Pre-oxygenation with 100% oxygen Induction Type: IV induction Ventilation: Mask ventilation without difficulty Laryngoscope Size: Mac and 4 Grade View: Grade I Tube type: Oral Tube size: 7.5 mm Number of attempts: 1 Airway Equipment and Method: Stylet and Oral airway Placement Confirmation: ETT inserted through vocal cords under direct vision, positive ETCO2 and breath sounds checked- equal and bilateral Secured at: 22 cm Tube secured with: Tape Dental Injury: Teeth and Oropharynx as per pre-operative assessment

## 2020-12-15 NOTE — Anesthesia Preprocedure Evaluation (Addendum)
Anesthesia Evaluation  Patient identified by MRN, date of birth, ID band Patient awake    Reviewed: Allergy & Precautions, NPO status , Patient's Chart, lab work & pertinent test results, reviewed documented beta blocker date and time   History of Anesthesia Complications Negative for: history of anesthetic complications  Airway Mallampati: I  TM Distance: >3 FB Neck ROM: Full    Dental  (+) Edentulous Upper, Edentulous Lower   Pulmonary former smoker,    breath sounds clear to auscultation       Cardiovascular hypertension, Pt. on medications and Pt. on home beta blockers (-) angina+ CAD, + CABG and + Peripheral Vascular Disease  + dysrhythmias Atrial Fibrillation + Valvular Problems/Murmurs AS  Rhythm:Regular Rate:Normal + Systolic murmurs [id number] ECHO: EF 55%, normal LVF, Grade 1 DD, mildly reduced RVF, mod AS with mean gradient 29 mmHg, peak gradient 48 mmHg   Neuro/Psych negative neurological ROS     GI/Hepatic Neg liver ROS, GERD  Controlled,Bile leak   Endo/Other  Hypothyroidism   Renal/GU negative Renal ROS     Musculoskeletal   Abdominal   Peds  Hematology negative hematology ROS (+)   Anesthesia Other Findings   Reproductive/Obstetrics                            Anesthesia Physical Anesthesia Plan  ASA: 3  Anesthesia Plan: General   Post-op Pain Management:    Induction: Intravenous  PONV Risk Score and Plan: 2 and Ondansetron and Dexamethasone  Airway Management Planned: Oral ETT  Additional Equipment: None  Intra-op Plan:   Post-operative Plan: Extubation in OR  Informed Consent: I have reviewed the patients History and Physical, chart, labs and discussed the procedure including the risks, benefits and alternatives for the proposed anesthesia with the patient or authorized representative who has indicated his/her understanding and acceptance.     Dental advisory  given  Plan Discussed with: CRNA and Surgeon  Anesthesia Plan Comments: (Discussed with patient and his daughter)        Anesthesia Quick Evaluation

## 2020-12-19 ENCOUNTER — Encounter (HOSPITAL_COMMUNITY): Payer: Self-pay | Admitting: Gastroenterology

## 2020-12-27 ENCOUNTER — Other Ambulatory Visit: Payer: Self-pay

## 2020-12-27 ENCOUNTER — Ambulatory Visit
Admission: RE | Admit: 2020-12-27 | Discharge: 2020-12-27 | Disposition: A | Discharge status: Home / Self Care | Payer: PPO | Source: Ambulatory Visit | Attending: Surgery | Admitting: Surgery

## 2020-12-27 ENCOUNTER — Other Ambulatory Visit: Payer: Self-pay | Admitting: Surgery

## 2020-12-27 DIAGNOSIS — I714 Abdominal aortic aneurysm, without rupture, unspecified: Secondary | ICD-10-CM

## 2020-12-27 DIAGNOSIS — Z85820 Personal history of malignant melanoma of skin: Secondary | ICD-10-CM | POA: Diagnosis not present

## 2020-12-27 DIAGNOSIS — N281 Cyst of kidney, acquired: Secondary | ICD-10-CM | POA: Diagnosis not present

## 2020-12-27 DIAGNOSIS — C44311 Basal cell carcinoma of skin of nose: Secondary | ICD-10-CM | POA: Diagnosis not present

## 2020-12-27 DIAGNOSIS — L821 Other seborrheic keratosis: Secondary | ICD-10-CM | POA: Diagnosis not present

## 2020-12-27 DIAGNOSIS — L905 Scar conditions and fibrosis of skin: Secondary | ICD-10-CM | POA: Diagnosis not present

## 2020-12-27 DIAGNOSIS — D485 Neoplasm of uncertain behavior of skin: Secondary | ICD-10-CM | POA: Diagnosis not present

## 2020-12-27 DIAGNOSIS — L570 Actinic keratosis: Secondary | ICD-10-CM | POA: Diagnosis not present

## 2020-12-27 DIAGNOSIS — L89899 Pressure ulcer of other site, unspecified stage: Secondary | ICD-10-CM | POA: Diagnosis not present

## 2020-12-27 DIAGNOSIS — D225 Melanocytic nevi of trunk: Secondary | ICD-10-CM | POA: Diagnosis not present

## 2020-12-27 DIAGNOSIS — D1801 Hemangioma of skin and subcutaneous tissue: Secondary | ICD-10-CM | POA: Diagnosis not present

## 2020-12-27 DIAGNOSIS — J929 Pleural plaque without asbestos: Secondary | ICD-10-CM | POA: Diagnosis not present

## 2020-12-27 DIAGNOSIS — Z85828 Personal history of other malignant neoplasm of skin: Secondary | ICD-10-CM | POA: Diagnosis not present

## 2021-01-01 ENCOUNTER — Ambulatory Visit (INDEPENDENT_AMBULATORY_CARE_PROVIDER_SITE_OTHER): Payer: PPO | Admitting: Surgery

## 2021-01-01 ENCOUNTER — Encounter: Payer: Self-pay | Admitting: Surgery

## 2021-01-01 ENCOUNTER — Other Ambulatory Visit: Payer: Self-pay

## 2021-01-01 VITALS — BP 150/69 | HR 68 | Temp 97.9°F | Resp 20 | Ht 66.0 in | Wt 137.0 lb

## 2021-01-01 DIAGNOSIS — I714 Abdominal aortic aneurysm, without rupture, unspecified: Secondary | ICD-10-CM

## 2021-01-01 DIAGNOSIS — I6523 Occlusion and stenosis of bilateral carotid arteries: Secondary | ICD-10-CM | POA: Diagnosis not present

## 2021-01-01 NOTE — Progress Notes (Signed)
[facility]ist [facility] [place]  Patient name: Juan Benjamin MRN: [id number] DOB: 1930-07-26 Sex: male   REASON FOR VISIT:    Follow up  [place]:    Juan Benjamin is a 87 y.o. male who presented to the ER on 09/25/2020 with abdominal pain.  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  He was transferred to [facility] for further evaluation.  This ended up being his gallbladder which was treated with a subtotal cholecystectomy for a gangrenous calculus cholecystitis.  He is subsequently had a biliary stent placed for persistent leak.  The patient has a history of coronary artery disease, status post CABG in 2000.  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  He is a former smoker.  He takes a statin for hypercholesterolemia. PAST MEDICAL HISTORY:   Past Medical History:  Diagnosis Date   AAA (abdominal aortic aneurysm) ([place])    Aortic stenosis    Carotid artery occlusion    Chronic occlusion of left, R CEA   Coronary artery disease    GERD (gastroesophageal reflux disease)    Hyperlipidemia    Hypertension    Hypothyroidism    PAF (paroxysmal atrial fibrillation) ([facility])    Pneumonia    Prostate cancer ([place])      FAMILY HISTORY:   Family History  Problem Relation Age of Onset   Bone cancer Mother    Colon cancer Neg Hx    Esophageal cancer Neg Hx    Inflammatory bowel disease Neg Hx    Liver disease Neg Hx    Pancreatic cancer Neg Hx    Rectal cancer Neg Hx    Stomach cancer Neg Hx     SOCIAL HISTORY:   Social History   Tobacco Use   Smoking status: Former    Types: Cigarettes    Quit date: 12/12/1980    Years since quitting: 40.0   Smokeless tobacco: Never  Substance Use Topics   Alcohol use: No     ALLERGIES:   Allergies  Allergen Reactions   Bee Venom Anaphylaxis   Wasp Venom Anaphylaxis   Crestor [Rosuvastatin] Other (See  Comments)    myalgia   Gemfibrozil Other (See Comments)    unknown   Indomethacin Other (See Comments)    unknown   Lipitor [Atorvastatin]     myalgia   Lisinopril Cough   Rofecoxib Other (See Comments)    unknown   Sildenafil Other (See Comments)    unknown   Simvastatin Other (See Comments)    unknown     CURRENT MEDICATIONS:   Current Outpatient Medications  Medication Sig Dispense Refill   acetaminophen (TYLENOL) 325 MG tablet Take 325-650 mg by mouth every 6 (six) hours as needed for moderate pain (ear pain).      allopurinol (ZYLOPRIM) 100 MG tablet Take 100 mg by mouth at bedtime.      amLODipine (NORVASC) 5 MG tablet Take 2 tablets (10 mg total) by mouth daily. 60 tablet 0   aspirin 81 MG tablet Take 1 tablet (81 mg total) by mouth daily. (Patient taking differently: Take 81 mg by mouth at bedtime.) 90 tablet 3   Cholecalciferol (VITAMIN D-3 PO) Take 2,000 Units by mouth daily.     diphenhydrAMINE HCl (BENADRYL ALLERGY PO) Take 1 tablet by mouth as needed (Bee sting).     EPINEPHrine 0.3 mg/0.3 mL IJ SOAJ injection Inject  0.3 mLs (0.3 mg total) into the muscle as needed for anaphylaxis. 1 each 0   ferrous sulfate 325 (65 FE) MG tablet Take 325 mg by mouth every other day.     levothyroxine (SYNTHROID) 88 MCG tablet      propranolol (INDERAL) 40 MG tablet Take 40 mg by mouth every evening.     vitamin B-12 (CYANOCOBALAMIN) 1000 MCG tablet Take 2,000 mcg by mouth daily at 12 noon.     zinc oxide (BALMEX) 11.3 % CREA cream Apply 1 application topically daily as needed (pressure spot).     rosuvastatin (CRESTOR) 20 MG tablet Take 1 tablet (20 mg total) by mouth daily. (Patient taking differently: Take 10 mg by mouth every evening.) 90 tablet 3   No current facility-administered medications for this visit.    REVIEW OF SYSTEMS:   '[X]'$  denotes positive finding, '[ ]'$  denotes negative finding Cardiac  Comments:  Chest pain or chest pressure:    Shortness of breath upon  exertion:    Short of breath when lying flat:    Irregular heart rhythm:        Vascular    Pain in calf, thigh, or hip brought on by ambulation:    Pain in feet at night that wakes you up from your sleep:     Blood clot in your veins:    Leg swelling:         Pulmonary    Oxygen at home:    Productive cough:     Wheezing:         Neurologic    Sudden weakness in arms or legs:     Sudden numbness in arms or legs:     Sudden onset of difficulty speaking or slurred speech:    Temporary loss of vision in one eye:     Problems with dizziness:         Gastrointestinal    Blood in stool:     Vomited blood:         Genitourinary    Burning when urinating:     Blood in urine:        Psychiatric    Major depression:         Hematologic    Bleeding problems:    Problems with blood clotting too easily:        Skin    Rashes or ulcers:        Constitutional    Fever or chills:      PHYSICAL EXAM:   Vitals:   01/01/21 1048  BP: (!) 150/69  Pulse: 68  Resp: 20  Temp: 97.9 F (36.6 C)  SpO2: 91%  Weight: 137 lb (62.1 kg)  Height: '5\' 6"'$  (1.676 m)    GENERAL: The patient is a well-nourished male, in no acute distress. The vital signs are documented above. CARDIAC: There is a regular rate and rhythm.  PULMONARY: Non-labored respirations ABDOMEN: Soft and non-tender.  Aneurysm easily palpable without discomfort.  MUSCULOSKELETAL: There are no major deformities or cyanosis. NEUROLOGIC: No focal weakness or paresthesias are detected. SKIN: There are no ulcers or rashes noted. PSYCHIATRIC: The patient has a normal affect.  STUDIES:   I have reviewed the following CT 1. Stable abdominal aortic aneurysm measuring 3.5 cm. 2. Stable fusiform aneurysm involving the proximal left common iliac artery measures 4.0 cm. 3. Probable high-grade stenosis at the origin of the right common iliac artery based on the calcified plaque burden in this area. Right  common iliac artery  measures 1.9 cm and minimally changed. 4. Normal caliber of the thoracic aorta. 5. Metallic biliary stent in the common bile duct and duodenum. Evidence for pneumobilia. Pancreatic stent is no longer present and has probably fractured and moved into the right colon. 6. Bilateral renal cysts.  Atrophic right kidney. 7. No acute abnormality in the chest, abdomen or pelvis. 8. Left pleural calcifications with mild pleural thickening.  MEDICAL ISSUES:   Aortic and iliac aneurysm: Maximum iliac diameter is 4 cm.  I discussed that we should consider getting this repaired which I think can be done endovascularly, however he still has a biliary stent in place which is going to need to be removed within the next several months.  I told him I would like to wait until all of his biliary issues are resolved prior to proceeding with aneurysm repair.  In addition, I would like to get a formal CT angiogram with contrast to help with planning.  His last scan was without contrast because of renal issues.  Therefore, I will have him follow-up in 6 months with a scan.  Carotid: He has a known left carotid occlusion and moderate right-sided stenosis.  I will repeat his ultrasound in 6 months.  PAD: He will also get ABIs and duplex    Annamarie Major, IV, MD, FACS [facility] [phone] Pager [phone]

## 2021-01-02 DIAGNOSIS — H524 Presbyopia: Secondary | ICD-10-CM | POA: Diagnosis not present

## 2021-01-02 DIAGNOSIS — H5203 Hypermetropia, bilateral: Secondary | ICD-10-CM | POA: Diagnosis not present

## 2021-01-02 DIAGNOSIS — Z961 Presence of intraocular lens: Secondary | ICD-10-CM | POA: Diagnosis not present

## 2021-01-03 ENCOUNTER — Other Ambulatory Visit: Payer: Self-pay

## 2021-01-03 DIAGNOSIS — I6523 Occlusion and stenosis of bilateral carotid arteries: Secondary | ICD-10-CM

## 2021-01-03 DIAGNOSIS — I714 Abdominal aortic aneurysm, without rupture, unspecified: Secondary | ICD-10-CM

## 2021-02-06 ENCOUNTER — Telehealth: Payer: Self-pay | Admitting: Gastroenterology

## 2021-02-06 NOTE — Telephone Encounter (Signed)
ERCP GM stent removal

## 2021-02-06 NOTE — Telephone Encounter (Signed)
Inbound call from pt's niece requesting a call back to see about scheduling pt's stint to get taken out. Please advise. Thank you

## 2021-02-07 ENCOUNTER — Other Ambulatory Visit: Payer: Self-pay

## 2021-02-07 DIAGNOSIS — K839 Disease of biliary tract, unspecified: Secondary | ICD-10-CM

## 2021-02-07 NOTE — Telephone Encounter (Signed)
I spoke with the niece and she tells me that she is unable to be with the pt after his ERCP and is asking if the case can be put off until November.  She is a Designer, industrial/product and cannot stay with Juan Benjamin.  She states that when he is put to sleep he gets very confused and someone needs to stay with him afterwards.

## 2021-02-07 NOTE — Telephone Encounter (Signed)
That will be fine. Thanks. GM 

## 2021-02-07 NOTE — Telephone Encounter (Signed)
Appt time changed to 1130 am instructions corrected

## 2021-02-07 NOTE — Telephone Encounter (Signed)
ERCP scheduled for 03/12/21 at 930 am at [facility] with GM

## 2021-02-14 NOTE — Telephone Encounter (Signed)
I called the pt niece back to make her aware that we could move the case to November.  She states that she wants to leave as is for 10/17 she found a ride for her uncle.  I have left as planned.

## 2021-03-01 DIAGNOSIS — C44311 Basal cell carcinoma of skin of nose: Secondary | ICD-10-CM | POA: Diagnosis not present

## 2021-03-12 ENCOUNTER — Encounter (HOSPITAL_COMMUNITY)
Admission: RE | Disposition: A | Discharge status: Home / Self Care | Payer: Self-pay | Source: Home / Self Care | Attending: Gastroenterology

## 2021-03-12 ENCOUNTER — Encounter (HOSPITAL_COMMUNITY): Payer: Self-pay | Admitting: Gastroenterology

## 2021-03-12 ENCOUNTER — Other Ambulatory Visit: Payer: Self-pay

## 2021-03-12 ENCOUNTER — Ambulatory Visit (HOSPITAL_COMMUNITY)
Admission: RE | Admit: 2021-03-12 | Discharge: 2021-03-12 | Disposition: A | Discharge status: Home / Self Care | Payer: PPO | Attending: Gastroenterology | Admitting: Gastroenterology

## 2021-03-12 ENCOUNTER — Ambulatory Visit (HOSPITAL_COMMUNITY): Payer: PPO | Admitting: Certified Registered"

## 2021-03-12 ENCOUNTER — Ambulatory Visit (HOSPITAL_COMMUNITY): Payer: PPO

## 2021-03-12 DIAGNOSIS — Z9689 Presence of other specified functional implants: Secondary | ICD-10-CM | POA: Insufficient documentation

## 2021-03-12 DIAGNOSIS — Z9889 Other specified postprocedural states: Secondary | ICD-10-CM | POA: Diagnosis not present

## 2021-03-12 DIAGNOSIS — Z87891 Personal history of nicotine dependence: Secondary | ICD-10-CM | POA: Insufficient documentation

## 2021-03-12 DIAGNOSIS — K839 Disease of biliary tract, unspecified: Secondary | ICD-10-CM

## 2021-03-12 DIAGNOSIS — K838 Other specified diseases of biliary tract: Secondary | ICD-10-CM | POA: Diagnosis not present

## 2021-03-12 DIAGNOSIS — Z9049 Acquired absence of other specified parts of digestive tract: Secondary | ICD-10-CM | POA: Insufficient documentation

## 2021-03-12 DIAGNOSIS — Z4659 Encounter for fitting and adjustment of other gastrointestinal appliance and device: Secondary | ICD-10-CM

## 2021-03-12 DIAGNOSIS — E785 Hyperlipidemia, unspecified: Secondary | ICD-10-CM | POA: Diagnosis not present

## 2021-03-12 DIAGNOSIS — I480 Paroxysmal atrial fibrillation: Secondary | ICD-10-CM | POA: Diagnosis not present

## 2021-03-12 DIAGNOSIS — Z8546 Personal history of malignant neoplasm of prostate: Secondary | ICD-10-CM | POA: Diagnosis not present

## 2021-03-12 DIAGNOSIS — K219 Gastro-esophageal reflux disease without esophagitis: Secondary | ICD-10-CM | POA: Diagnosis not present

## 2021-03-12 DIAGNOSIS — I2510 Atherosclerotic heart disease of native coronary artery without angina pectoris: Secondary | ICD-10-CM | POA: Diagnosis not present

## 2021-03-12 HISTORY — PX: ENDOSCOPIC RETROGRADE CHOLANGIOPANCREATOGRAPHY (ERCP) WITH PROPOFOL: SHX5810

## 2021-03-12 HISTORY — PX: STENT REMOVAL: SHX6421

## 2021-03-12 HISTORY — PX: REMOVAL OF STONES: SHX5545

## 2021-03-12 LAB — POCT I-STAT, CHEM 8
BUN: 31 mg/dL — ABNORMAL HIGH (ref 8–23)
Calcium, Ion: 1.37 mmol/L (ref 1.15–1.40)
Chloride: 106 mmol/L (ref 98–111)
Creatinine, Ser: 1.9 mg/dL — ABNORMAL HIGH (ref 0.61–1.24)
Glucose, Bld: 92 mg/dL (ref 70–99)
HCT: 42 % (ref 39.0–52.0)
Hemoglobin: 14.3 g/dL (ref 13.0–17.0)
Potassium: 4.5 mmol/L (ref 3.5–5.1)
Sodium: 141 mmol/L (ref 135–145)
TCO2: 24 mmol/L (ref 22–32)

## 2021-03-12 SURGERY — ENDOSCOPIC RETROGRADE CHOLANGIOPANCREATOGRAPHY (ERCP) WITH PROPOFOL
Anesthesia: General

## 2021-03-12 MED ORDER — CIPROFLOXACIN IN D5W 400 MG/200ML IV SOLN
INTRAVENOUS | Status: DC | PRN
  Administered 2021-03-12: 400 mg via INTRAVENOUS

## 2021-03-12 MED ORDER — PROPOFOL 10 MG/ML IV BOLUS
INTRAVENOUS | Status: DC | PRN
Start: 2021-03-12 — End: 2021-03-12
  Administered 2021-03-12: 70 mg via INTRAVENOUS

## 2021-03-12 MED ORDER — PROPOFOL 10 MG/ML IV BOLUS
INTRAVENOUS | Status: AC
  Filled 2021-03-12: qty 20

## 2021-03-12 MED ORDER — SODIUM CHLORIDE 0.9 % IV SOLN
INTRAVENOUS | Status: DC | PRN
  Administered 2021-03-12: 40 mL

## 2021-03-12 MED ORDER — ROCURONIUM BROMIDE 10 MG/ML (PF) SYRINGE
PREFILLED_SYRINGE | INTRAVENOUS | Status: DC | PRN
  Administered 2021-03-12: 50 mg via INTRAVENOUS

## 2021-03-12 MED ORDER — INDOMETHACIN 50 MG RE SUPP
RECTAL | Status: AC
  Filled 2021-03-12: qty 2

## 2021-03-12 MED ORDER — CIPROFLOXACIN IN D5W 400 MG/200ML IV SOLN
INTRAVENOUS | Status: AC
  Filled 2021-03-12: qty 200

## 2021-03-12 MED ORDER — SODIUM CHLORIDE 0.9 % IV SOLN: INTRAVENOUS | Status: DC

## 2021-03-12 MED ORDER — PHENYLEPHRINE HCL-NACL 20-0.9 MG/250ML-% IV SOLN
INTRAVENOUS | Status: DC | PRN
  Administered 2021-03-12: 25 ug/min via INTRAVENOUS

## 2021-03-12 MED ORDER — SUGAMMADEX SODIUM 200 MG/2ML IV SOLN
INTRAVENOUS | Status: DC | PRN
  Administered 2021-03-12: 140 mg via INTRAVENOUS

## 2021-03-12 MED ORDER — GLUCAGON HCL RDNA (DIAGNOSTIC) 1 MG IJ SOLR
INTRAMUSCULAR | Status: DC | PRN
  Administered 2021-03-12: .25 mg via INTRAVENOUS

## 2021-03-12 MED ORDER — LIDOCAINE 2% (20 MG/ML) 5 ML SYRINGE
INTRAMUSCULAR | Status: DC | PRN
  Administered 2021-03-12: 40 mg via INTRAVENOUS

## 2021-03-12 MED ORDER — GLUCAGON HCL RDNA (DIAGNOSTIC) 1 MG IJ SOLR
INTRAMUSCULAR | Status: AC
  Filled 2021-03-12: qty 2

## 2021-03-12 MED ORDER — DEXAMETHASONE SODIUM PHOSPHATE 10 MG/ML IJ SOLN
INTRAMUSCULAR | Status: DC | PRN
  Administered 2021-03-12: 5 mg via INTRAVENOUS

## 2021-03-12 MED ORDER — FENTANYL CITRATE (PF) 100 MCG/2ML IJ SOLN
INTRAMUSCULAR | Status: AC
  Filled 2021-03-12: qty 2

## 2021-03-12 MED ORDER — INDOMETHACIN 50 MG RE SUPP: RECTAL | Status: DC | PRN

## 2021-03-12 MED ORDER — ONDANSETRON HCL 4 MG/2ML IJ SOLN
INTRAMUSCULAR | Status: DC | PRN
  Administered 2021-03-12: 4 mg via INTRAVENOUS

## 2021-03-12 MED ORDER — LACTATED RINGERS IV SOLN: INTRAVENOUS | Status: DC

## 2021-03-12 MED ORDER — EPHEDRINE SULFATE-NACL 50-0.9 MG/10ML-% IV SOSY
PREFILLED_SYRINGE | INTRAVENOUS | Status: DC | PRN
  Administered 2021-03-12: 5 mg via INTRAVENOUS

## 2021-03-12 NOTE — Anesthesia Postprocedure Evaluation (Signed)
Anesthesia Post Note  Patient: [name]  Procedure(s) Performed: ENDOSCOPIC RETROGRADE CHOLANGIOPANCREATOGRAPHY (ERCP) WITH PROPOFOL STENT REMOVAL REMOVAL OF STONES     Patient location during evaluation: PACU Anesthesia Type: General Level of consciousness: awake Pain management: pain level controlled Vital Signs Assessment: post-procedure vital signs reviewed and stable Respiratory status: spontaneous breathing Cardiovascular status: stable Postop Assessment: no apparent nausea or vomiting Anesthetic complications: no   No notable events documented.  Last Vitals:  Vitals:   03/12/21 1023 03/12/21 1320  BP: (!) 148/59 (!) 132/52  Pulse: 62 (!) 58  Resp: 18   Temp: 36.5 C (!) 36.3 C  SpO2: 96% 100%    Last Pain:  Vitals:   03/12/21 1320  TempSrc: Oral  PainSc: 0-No pain                 Nakyla Bracco

## 2021-03-12 NOTE — Anesthesia Preprocedure Evaluation (Addendum)
Anesthesia Evaluation  Patient identified by MRN, date of birth, ID band Patient awake    Reviewed: Allergy & Precautions, NPO status , Patient's Chart, lab work & pertinent test results  Airway Mallampati: II  TM Distance: >3 FB     Dental   Pulmonary pneumonia, former smoker,    breath sounds clear to auscultation       Cardiovascular hypertension, + CAD   Rhythm:Regular Rate:Normal     Neuro/Psych    GI/Hepatic Neg liver ROS, GERD  ,  Endo/Other  Hypothyroidism   Renal/GU negative Renal ROS     Musculoskeletal   Abdominal   Peds  Hematology   Anesthesia Other Findings   Reproductive/Obstetrics                             Anesthesia Physical Anesthesia Plan  ASA: 3  Anesthesia Plan: General   Post-op Pain Management:    Induction: Intravenous  PONV Risk Score and Plan: 3 and Ondansetron, Midazolam, Dexamethasone and Treatment may vary due to age or medical condition  Airway Management Planned: Oral ETT  Additional Equipment:   Intra-op Plan:   Post-operative Plan: Extubation in OR  Informed Consent: I have reviewed the patients History and Physical, chart, labs and discussed the procedure including the risks, benefits and alternatives for the proposed anesthesia with the patient or authorized representative who has indicated his/her understanding and acceptance.     Dental advisory given  Plan Discussed with: CRNA and Anesthesiologist  Anesthesia Plan Comments:         Anesthesia Quick Evaluation

## 2021-03-12 NOTE — Discharge Instructions (Signed)
YOU HAD AN ENDOSCOPIC PROCEDURE TODAY: Refer to the procedure report and other information in the discharge instructions given to you for any specific questions about what was found during the examination. If this information does not answer your questions, please call [place] office at [phone] to clarify.   YOU SHOULD EXPECT: Some feelings of bloating in the abdomen. Passage of more gas than usual. Walking can help get rid of the air that was put into your GI tract during the procedure and reduce the bloating. If you had a lower endoscopy (such as a colonoscopy or flexible sigmoidoscopy) you may notice spotting of blood in your stool or on the toilet paper. Some abdominal soreness may be present for a day or two, also.  DIET: Your first meal following the procedure should be a light meal and then it is ok to progress to your normal diet. A half-sandwich or bowl of soup is an example of a good first meal. Heavy or fried foods are harder to digest and may make you feel nauseous or bloated. Drink plenty of fluids but you should avoid alcoholic beverages for 24 hours.   ACTIVITY: Your care partner should take you home directly after the procedure. You should plan to take it easy, moving slowly for the rest of the day. You can resume normal activity the day after the procedure however YOU SHOULD NOT DRIVE, use power tools, machinery or perform tasks that involve climbing or major physical exertion for 24 hours (because of the sedation medicines used during the test).   SYMPTOMS TO REPORT IMMEDIATELY: A gastroenterologist can be reached at any hour. Please call [phone]  for any of the following symptoms:  Following upper endoscopy (EGD, EUS, ERCP, esophageal dilation) Vomiting of blood or coffee ground material  New, significant abdominal pain  New, significant chest pain or pain under the shoulder blades  Painful or persistently difficult swallowing  New shortness of breath  Black,  tarry-looking or red, bloody stools  FOLLOW UP:  If any biopsies were taken you will be contacted by phone or by letter within the next 1-3 weeks. Call [phone]  if you have not heard about the biopsies in 3 weeks.  Please also call with any specific questions about appointments or follow up tests.  

## 2021-03-12 NOTE — H&P (Signed)
GASTROENTEROLOGY PROCEDURE H&P NOTE   Primary Care Physician: Aletha Halim., PA-C  HPI: Juan Benjamin is a 87 y.o. male who presents for ERCP for follow-up of biliary leak status post prior subtotal cholecystectomy and most recent ERCP showing evidence of persistent extravasation and placement of fully covered self-expanding metal stent.  Patient is asymptomatic and not having any abdominal pain or discomfort.  Past Medical History:  Diagnosis Date   AAA (abdominal aortic aneurysm)    Aortic stenosis    Carotid artery occlusion    Chronic occlusion of left, R CEA   Coronary artery disease    GERD (gastroesophageal reflux disease)    Hyperlipidemia    Hypertension    Hypothyroidism    PAF (paroxysmal atrial fibrillation) (H[facility])    Pneumonia    Prostate cancer [facility])    Past Surgical History:  Procedure Laterality Date   BILIARY STENT PLACEMENT  09/29/2020   Procedure: BILIARY STENT PLACEMENT;  Surgeon: Irving Copas., MD;  Location: [place];  Service: Gastroenterology;;   BILIARY STENT PLACEMENT N/A 12/15/2020   Procedure: BILIARY STENT PLACEMENT;  Surgeon: Irving Copas., MD;  Location: [facility];  Service: Gastroenterology;  Laterality: N/A;   BIOPSY  09/29/2020   Procedure: BIOPSY;  Surgeon: Rush Landmark Telford Nab., MD;  Location: [place];  Service: Gastroenterology;;   CAROTID ARTERY SURGERY     Right    CHOLECYSTECTOMY N/A 09/26/2020   Procedure: LAPAROSCOPIC SUBTOTAL CHOLECYSTECTOMY;  Surgeon: Jesusita Oka, MD;  Location: [place];  Service: General;  Laterality: N/A;   CORONARY ARTERY BYPASS GRAFT     CABG (4 vessel, 2000.  No report in [place])   ENDOSCOPIC RETROGRADE CHOLANGIOPANCREATOGRAPHY (ERCP) WITH PROPOFOL N/A 09/29/2020   Procedure: ENDOSCOPIC RETROGRADE CHOLANGIOPANCREATOGRAPHY (ERCP) WITH PROPOFOL;  Surgeon: Rush Landmark Telford Nab., MD;  Location: [place];  Service: Gastroenterology;  Laterality: N/A;   ENDOSCOPIC RETROGRADE  CHOLANGIOPANCREATOGRAPHY (ERCP) WITH PROPOFOL N/A 12/15/2020   Procedure: ENDOSCOPIC RETROGRADE CHOLANGIOPANCREATOGRAPHY (ERCP) WITH PROPOFOL;  Surgeon: Rush Landmark Telford Nab., MD;  Location: [facility];  Service: Gastroenterology;  Laterality: N/A;   HEMOSTASIS CLIP PLACEMENT  12/15/2020   Procedure: HEMOSTASIS CLIP PLACEMENT;  Surgeon: Rush Landmark Telford Nab., MD;  Location: [facility];  Service: Gastroenterology;;   MELANOMA EXCISION WITH SENTINEL LYMPH NODE BIOPSY Left 03/21/2020   Procedure: WIDE LOCAL EXCISION LEFT UPPER ARM MELANOMA ADVANCEMENT FLAP CLOSURE FOR DEFECT 4X8CM SENTINEL LYMPH NODE MAPPING AND BIOPSY;  Surgeon: Stark Klein, MD;  Location: [place];  Service: General;  Laterality: Left;   PANCREATIC STENT PLACEMENT  09/29/2020   Procedure: PANCREATIC STENT PLACEMENT;  Surgeon: Irving Copas., MD;  Location: [place];  Service: Gastroenterology;;   REMOVAL OF STONES  09/29/2020   Procedure: REMOVAL OF STONES;  Surgeon: Irving Copas., MD;  Location: [place];  Service: Gastroenterology;;   REMOVAL OF STONES  12/15/2020   Procedure: REMOVAL OF STONES;  Surgeon: Irving Copas., MD;  Location: [facility];  Service: Gastroenterology;;   RENAL ARTERY STENT  07/02/1999   RIGHT SIDE   SPHINCTEROTOMY  09/29/2020   Procedure: SPHINCTEROTOMY;  Surgeon: Mansouraty, Telford Nab., MD;  Location: [place];  Service: Gastroenterology;;   Lavell Islam REMOVAL  12/15/2020   Procedure: STENT REMOVAL;  Surgeon: Irving Copas., MD;  Location: [facility];  Service: Gastroenterology;;   Current Facility-Administered Medications  Medication Dose Route Frequency Provider Last Rate Last Admin   0.9 %  sodium chloride infusion   Intravenous Continuous Mansouraty, Telford Nab., MD  lactated ringers infusion   Intravenous Continuous Mansouraty, Telford Nab., MD 10 mL/hr at 03/12/21 1114 Continued from Pre-op at 03/12/21 1114    Current Facility-Administered  Medications:    0.9 %  sodium chloride infusion, , Intravenous, Continuous, Mansouraty, Telford Nab., MD   lactated ringers infusion, , Intravenous, Continuous, Mansouraty, Telford Nab., MD, Last Rate: 10 mL/hr at 03/12/21 1114, Continued from Pre-op at 03/12/21 1114 Allergies  Allergen Reactions   Bee Venom Anaphylaxis   Wasp Venom Anaphylaxis   Crestor [Rosuvastatin] Other (See Comments)    myalgia   Gemfibrozil Other (See Comments)    unknown   Indomethacin Other (See Comments)    unknown   Lipitor [Atorvastatin]     myalgia   Lisinopril Cough   Rofecoxib Other (See Comments)    unknown   Sildenafil Other (See Comments)    unknown   Simvastatin Other (See Comments)    unknown   Family History  Problem Relation Age of Onset   Bone cancer Mother    Colon cancer Neg Hx    Esophageal cancer Neg Hx    Inflammatory bowel disease Neg Hx    Liver disease Neg Hx    Pancreatic cancer Neg Hx    Rectal cancer Neg Hx    Stomach cancer Neg Hx    Social History   Socioeconomic History   Marital status: Single    Spouse name: Not on file   Number of children: Not on file   Years of education: Not on file   Highest education level: Not on file  Occupational History   Occupation: Retired  Tobacco Use   Smoking status: Former    Types: Cigarettes    Quit date: 12/12/1980    Years since quitting: 40.2   Smokeless tobacco: Never  Vaping Use   Vaping Use: Never used  Substance and Sexual Activity   Alcohol use: No   Drug use: No   Sexual activity: Not on file  Other Topics Concern   Not on file  Social History Narrative   Lives at home alone   Social Determinants of Health   Financial Resource Strain: Not on file  Food Insecurity: Not on file  Transportation Needs: Not on file  Physical Activity: Not on file  Stress: Not on file  Social Connections: Not on file  Intimate Partner Violence: Not on file    Physical Exam: Today's Vitals   03/12/21 1023  BP: (!) 148/59   Pulse: 62  Resp: 18  Temp: 97.7 F (36.5 C)  TempSrc: Oral  SpO2: 96%  Weight: 64.9 kg  Height: 5\' 6"  (1.676 m)  PainSc: 0-No pain   Body mass index is 23.08 kg/m. GEN: NAD EYE: Sclerae anicteric ENT: MMM CV: Non-tachycardic GI: Soft, NT/ND NEURO:  Alert & Oriented x 3  Lab Results: No results for input(s): WBC, HGB, HCT, PLT in the last 72 hours. BMET No results for input(s): NA, K, CL, CO2, GLUCOSE, BUN, CREATININE, CALCIUM in the last 72 hours. LFT No results for input(s): PROT, ALBUMIN, AST, ALT, ALKPHOS, BILITOT, BILIDIR, IBILI in the last 72 hours. PT/INR No results for input(s): LABPROT, INR in the last 72 hours.   Impression / Plan: This is a 87 y.o.male who presents for ERCP for follow-up of biliary leak status post prior subtotal cholecystectomy and most recent ERCP showing evidence of persistent extravasation and placement of fully covered self-expanding metal stent.  Patient is asymptomatic and not having any abdominal pain or discomfort.  The risks of an ERCP were discussed at length, including but not limited to the risk of perforation, bleeding, abdominal pain, post-ERCP pancreatitis (while usually mild can be severe and even life threatening).  The risks and benefits of endoscopic evaluation/treatment were discussed with the patient and/or family; these include but are not limited to the risk of perforation, infection, bleeding, missed lesions, lack of diagnosis, severe illness requiring hospitalization, as well as anesthesia and sedation related illnesses.  The patient's history has been reviewed, patient examined, no change in status, and deemed stable for procedure.  The patient and/or family is agreeable to proceed.    [name], MD [facility] Office # [phone]

## 2021-03-12 NOTE — Anesthesia Procedure Notes (Signed)
Procedure Name: Intubation Date/Time: 03/12/2021 12:18 PM Performed by: Eben Burow, CRNA Pre-anesthesia Checklist: Patient identified, Emergency Drugs available, Suction available, Patient being monitored and Timeout performed Patient Re-evaluated:Patient Re-evaluated prior to induction Oxygen Delivery Method: Circle system utilized Preoxygenation: Pre-oxygenation with 100% oxygen Induction Type: IV induction Ventilation: Mask ventilation without difficulty Laryngoscope Size: Mac and 4 Grade View: Grade I Tube type: Oral Tube size: 7.5 mm Number of attempts: 1 Airway Equipment and Method: Stylet Placement Confirmation: ETT inserted through vocal cords under direct vision, positive ETCO2 and breath sounds checked- equal and bilateral Secured at: 22 cm Tube secured with: Tape Dental Injury: Teeth and Oropharynx as per pre-operative assessment

## 2021-03-12 NOTE — Op Note (Addendum)
[facility] Patient Name: Juan Benjamin Procedure Date: 03/12/2021 MRN: [id number] Attending MD: [name] , MD Date of Birth: 1930-08-16 CSN: [id number] Age: 87 Admit Type: Outpatient Procedure:                ERCP Indications:              Bile leak, Biliary stent removal Providers:                [name], MD, Burtis Junes, RN, Janie                            Billups, Technician, Clae Kerin Ransom, Lerry Paterson, CRNA Referring MD:             Jesusita Oka, Dr. Deatra Ina Medicines:                General Anesthesia, Cipro 400 mg IV, Glucagon 0.25                            mg IV Complications:            No immediate complications. Estimated Blood Loss:     Estimated blood loss: none. Procedure:                Pre-Anesthesia Assessment:                           - Prior to the procedure, a History and Physical                            was performed, and patient medications and                            allergies were reviewed. The patient's tolerance of                            previous anesthesia was also reviewed. The risks                            and benefits of the procedure and the sedation                            options and risks were discussed with the patient.                            All questions were answered, and informed consent                            was obtained. Prior Anticoagulants: The patient has                            taken no previous anticoagulant or antiplatelet                            agents except for aspirin. ASA Grade Assessment:  III - A patient with severe systemic disease. After                            reviewing the risks and benefits, the patient was                            deemed in satisfactory condition to undergo the                            procedure.                           After obtaining informed consent, the scope was                            passed  under direct vision. Throughout the                            procedure, the patient's blood pressure, pulse, and                            oxygen saturations were monitored continuously. The                            [employer] single use                            duodenoscope was introduced through the mouth, and                            used to inject contrast into and used to inject                            contrast into the bile duct. The ERCP was                            accomplished without difficulty. The patient                            tolerated the procedure. Scope In: Scope Out: Findings:      A metal biliary stent was visible on the scout film.      The upper GI tract was traversed under direct vision without detailed       examination. A previously placed hemoclip was found in the second       portion of the duodenum. A biliary sphincterotomy had been performed.       The sphincterotomy appeared open. One covered metal biliary stent       originating in the biliary tree was emerging from the major papilla and       had partially migrated downwards. The stent was removed from the biliary       tree using a snare.      A short 0.035 inch Soft Jagwire was passed into the biliary tree. The       Hydratome sphincterotome was passed over the guidewire  and the bile duct       was then deeply cannulated. Contrast was injected. I personally       interpreted the bile duct images. Ductal flow of contrast was adequate.       Image quality was adequate. Contrast extended to the hepatic ducts.       Opacification of the entire biliary tree was successful in regards to       seeing the cystic duct and what appeared to be the subtotal       cholecystectomy that remained. The lower third of the main bile duct and       middle third of the main bile duct were moderately dilated. The largest       diameter was 12 mm. The main bile duct contained filling  defects thought       to be sludge. To discover objects, the biliary tree was swept with a       retrieval balloon. Sludge was swept from the duct. An occlusion       cholangiogram was performed that showed no further significant biliary       pathology. I was able to fill the cystic duct and maneuvered the C-arm       to position that was felt to not show any further evidence of       extravasation or persisting biliary leak.      A pancreatogram was not performed.      The duodenoscope was withdrawn from the patient. Impression:               - Duodenal hemoclip.                           - Prior biliary sphincterotomy appeared open with                            biliary stent from the biliary tree was seen                            emerging from the major papilla. The stent was                            removed.                           - The fluoroscopic examination was suspicious for                            sludge.                           - The middle third of the main bile duct and lower                            third of the main bile duct were moderately dilated.                           - The biliary tree was swept and sludge was found.                           -  I see evidence of the remaining cystic duct and                            what appears to be the subtotal cholecystectomy. I                            cannot see any evidence of biliary leak still                            occuring. Moderate Sedation:      Not Applicable - Patient had care per Anesthesia. Recommendation:           - The patient will be observed post-procedure,                            until all discharge criteria are met.                           - Discharge patient to home.                           - Patient has a contact number available for                            emergencies. The signs and symptoms of potential                            delayed complications were discussed  with the                            patient. Return to normal activities tomorrow.                            Written discharge instructions were provided to the                            patient.                           - Low fat diet.                           - Observe patient's clinical course.                           - Watch for pancreatitis, bleeding, perforation,                            and cholangitis.                           - I cannot appreciate any evidence of a large                            biliary leak. Small biliary leak is not evident  either, but could be missed. If patient were to                            develop significant pain or discomfort, I would                            recommend a CT-AP with contrast to ensure no                            evidence of a new fluid collection/leak is present                            that could require replacement of the biliary leak.                           - The findings and recommendations were discussed                            with the patient.                           - The findings and recommendations were discussed                            with the patient's family. Procedure Code(s):        --- Professional ---                           [phone], Endoscopic retrograde                            cholangiopancreatography (ERCP); with removal of                            foreign body(s) or stent(s) from biliary/pancreatic                            duct(s)                           43264, Endoscopic retrograde                            cholangiopancreatography (ERCP); with removal of                            calculi/debris from biliary/pancreatic duct(s) Diagnosis Code(s):        --- Professional ---                           T18.3XXS, Foreign body in small intestine, sequela                           Z96.89, Presence of other specified functional  implants                           K83.8, Other specified diseases of biliary tract                           Z46.59, Encounter for fitting and adjustment of                            other gastrointestinal appliance and device CPT copyright 2019 American Medical Association. All rights reserved. The codes documented in this report are preliminary and upon coder review may  be revised to meet current compliance requirements. [name], MD 03/12/2021 1:29:17 PM Number of Addenda: 0

## 2021-03-12 NOTE — Transfer of Care (Signed)
Immediate Anesthesia Transfer of Care Note  Patient: Lamontae Ricardo [facility]  Procedure(s) Performed: ENDOSCOPIC RETROGRADE CHOLANGIOPANCREATOGRAPHY (ERCP) WITH PROPOFOL STENT REMOVAL REMOVAL OF STONES  Patient Location: PACU and Endoscopy Unit  Anesthesia Type:General  Level of Consciousness: awake, alert  and patient cooperative  Airway & Oxygen Therapy: Patient Spontanous Breathing and Patient connected to face mask oxygen  Post-op Assessment: Report given to RN and Post -op Vital signs reviewed and stable  Post vital signs: Reviewed and stable  Last Vitals:  Vitals Value Taken Time  BP 132/51 03/12/21 1322  Temp    Pulse 58 03/12/21 1326  Resp 20 03/12/21 1326  SpO2 100 % 03/12/21 1326  Vitals shown include unvalidated device data.  Last Pain:  Vitals:   03/12/21 1320  TempSrc:   PainSc: 0-No pain         Complications: No notable events documented.

## 2021-03-13 ENCOUNTER — Encounter (HOSPITAL_COMMUNITY): Payer: Self-pay | Admitting: Gastroenterology

## 2021-03-19 DIAGNOSIS — Z23 Encounter for immunization: Secondary | ICD-10-CM | POA: Diagnosis not present

## 2021-03-26 ENCOUNTER — Inpatient Hospital Stay (HOSPITAL_COMMUNITY): Admission: RE | Admit: 2021-03-26 | Payer: PPO | Source: Ambulatory Visit

## 2021-03-29 ENCOUNTER — Ambulatory Visit (HOSPITAL_COMMUNITY)
Admission: RE | Admit: 2021-03-29 | Discharge: 2021-03-29 | Disposition: A | Discharge status: Home / Self Care | Payer: PPO | Source: Ambulatory Visit | Attending: Cardiovascular Disease | Admitting: Cardiovascular Disease

## 2021-03-29 ENCOUNTER — Other Ambulatory Visit: Payer: Self-pay

## 2021-03-29 DIAGNOSIS — I6523 Occlusion and stenosis of bilateral carotid arteries: Secondary | ICD-10-CM | POA: Insufficient documentation

## 2021-04-02 ENCOUNTER — Encounter: Payer: Self-pay | Admitting: *Deleted

## 2021-04-25 ENCOUNTER — Other Ambulatory Visit (HOSPITAL_COMMUNITY): Payer: Self-pay | Admitting: Cardiology

## 2021-04-25 DIAGNOSIS — I6522 Occlusion and stenosis of left carotid artery: Secondary | ICD-10-CM

## 2021-05-03 ENCOUNTER — Other Ambulatory Visit: Payer: Self-pay | Admitting: *Deleted

## 2021-05-03 DIAGNOSIS — I714 Abdominal aortic aneurysm, without rupture, unspecified: Secondary | ICD-10-CM

## 2021-05-30 ENCOUNTER — Inpatient Hospital Stay: Admission: RE | Admit: 2021-05-30 | Payer: PPO | Source: Ambulatory Visit

## 2021-06-19 ENCOUNTER — Other Ambulatory Visit: Payer: Self-pay

## 2021-06-19 ENCOUNTER — Ambulatory Visit
Admission: RE | Admit: 2021-06-19 | Discharge: 2021-06-19 | Disposition: A | Discharge status: Home / Self Care | Payer: PPO | Source: Ambulatory Visit | Attending: Surgery | Admitting: Surgery

## 2021-06-19 DIAGNOSIS — I714 Abdominal aortic aneurysm, without rupture, unspecified: Secondary | ICD-10-CM

## 2021-06-19 MED ORDER — IOPAMIDOL (ISOVUE-370) INJECTION 76%
60.0000 mL | Freq: Once | INTRAVENOUS | Status: AC | PRN
  Administered 2021-06-19: 60 mL via INTRAVENOUS

## 2021-07-23 ENCOUNTER — Ambulatory Visit (INDEPENDENT_AMBULATORY_CARE_PROVIDER_SITE_OTHER)
Admission: RE | Admit: 2021-07-23 | Discharge: 2021-07-23 | Disposition: A | Discharge status: Home / Self Care | Payer: PPO | Source: Ambulatory Visit | Attending: Surgery | Admitting: Surgery

## 2021-07-23 ENCOUNTER — Ambulatory Visit: Payer: PPO | Admitting: Surgery

## 2021-07-23 ENCOUNTER — Encounter: Payer: Self-pay | Admitting: Surgery

## 2021-07-23 ENCOUNTER — Ambulatory Visit (HOSPITAL_COMMUNITY)
Admission: RE | Admit: 2021-07-23 | Discharge: 2021-07-23 | Disposition: A | Discharge status: Home / Self Care | Payer: PPO | Source: Ambulatory Visit | Attending: Surgery | Admitting: Surgery

## 2021-07-23 ENCOUNTER — Other Ambulatory Visit: Payer: Self-pay

## 2021-07-23 VITALS — BP 144/57 | HR 68 | Temp 98.0°F | Resp 20 | Ht 63.0 in | Wt 141.0 lb

## 2021-07-23 DIAGNOSIS — I6523 Occlusion and stenosis of bilateral carotid arteries: Secondary | ICD-10-CM | POA: Insufficient documentation

## 2021-07-23 DIAGNOSIS — I7143 Infrarenal abdominal aortic aneurysm, without rupture: Secondary | ICD-10-CM

## 2021-07-23 DIAGNOSIS — I714 Abdominal aortic aneurysm, without rupture, unspecified: Secondary | ICD-10-CM | POA: Diagnosis not present

## 2021-07-23 DIAGNOSIS — I6522 Occlusion and stenosis of left carotid artery: Secondary | ICD-10-CM

## 2021-07-23 DIAGNOSIS — I723 Aneurysm of iliac artery: Secondary | ICD-10-CM

## 2021-07-23 NOTE — Progress Notes (Signed)
[facility] [place]  Patient name: Juan Benjamin MRN: [id number] DOB: [birth year] Sex: male   REASON FOR VISIT:    Follow up  [place]:      Juan Benjamin is a 88 y.o. male who presented to the ER on 09/25/2020 with abdominal pain.  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  He was transferred to [facility] for further evaluation.  This ended up being his gallbladder which was treated with a subtotal cholecystectomy for a gangrenous calculus cholecystitis.  He is subsequently had a biliary stent placed for persistent leak.   The patient has a history of coronary artery disease, status post CABG in 2000.  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  He is a former smoker.  He takes a statin for hypercholesterolemia.   PAST MEDICAL HISTORY:   Past Medical History:  Diagnosis Date   AAA (abdominal aortic aneurysm)    Aortic stenosis    Carotid artery occlusion    Chronic occlusion of left, R CEA   Coronary artery disease    GERD (gastroesophageal reflux disease)    Hyperlipidemia    Hypertension    Hypothyroidism    PAF (paroxysmal atrial fibrillation) (HCC)    Pneumonia    Prostate cancer ([place])      FAMILY HISTORY:   Family History  Problem Relation Age of Onset   Bone cancer Mother    Colon cancer Neg Hx    Esophageal cancer Neg Hx    Inflammatory bowel disease Neg Hx    Liver disease Neg Hx    Pancreatic cancer Neg Hx    Rectal cancer Neg Hx    Stomach cancer Neg Hx     SOCIAL HISTORY:   Social History   Tobacco Use   Smoking status: Former    Types: Cigarettes    Quit date: 12/12/1980    Years since quitting: 40.6   Smokeless tobacco: Never  Substance Use Topics   Alcohol use: No     ALLERGIES:   Allergies  Allergen Reactions   Bee Venom Anaphylaxis   Wasp Venom Anaphylaxis   Crestor [Rosuvastatin] Other (See  Comments)    myalgia   Gemfibrozil Other (See Comments)    unknown   Indomethacin Other (See Comments)    unknown   Lipitor [Atorvastatin]     myalgia   Lisinopril Cough   Rofecoxib Other (See Comments)    unknown   Sildenafil Other (See Comments)    unknown   Simvastatin Other (See Comments)    unknown     CURRENT MEDICATIONS:   Current Outpatient Medications  Medication Sig Dispense Refill   acetaminophen (TYLENOL) 325 MG tablet Take 325-650 mg by mouth every 6 (six) hours as needed for moderate pain.     allopurinol (ZYLOPRIM) 100 MG tablet Take 100 mg by mouth at bedtime.      amLODipine (NORVASC) 10 MG tablet Take 10 mg by mouth daily.     aspirin 81 MG tablet Take 1 tablet (81 mg total) by mouth daily. (Patient taking differently: Take 81 mg by mouth at bedtime.) 90 tablet 3   Cholecalciferol (VITAMIN D) 50 MCG (2000 UT) tablet Take 2,000 Units by mouth daily.     diphenhydrAMINE (BENADRYL) 25 MG tablet Take 1 tablet by mouth daily as needed (Bee sting).     EPINEPHrine 0.3 mg/0.3 mL IJ SOAJ  injection Inject 0.3 mLs (0.3 mg total) into the muscle as needed for anaphylaxis. 1 each 0   levothyroxine (SYNTHROID) 88 MCG tablet      Methylcobalamin 1000 MCG TBDP Take 1,000 mcg by mouth daily.     propranolol (INDERAL) 40 MG tablet Take 40 mg by mouth every evening.     rosuvastatin (CRESTOR) 10 MG tablet Take 10 mg by mouth daily.     zinc oxide (BALMEX) 11.3 % CREA cream Apply 1 application topically daily as needed (pressure spot).     No current facility-administered medications for this visit.    REVIEW OF SYSTEMS:   [X]  denotes positive finding, [ ]  denotes negative finding Cardiac  Comments:  Chest pain or chest pressure:    Shortness of breath upon exertion:    Short of breath when lying flat:    Irregular heart rhythm:        Vascular    Pain in calf, thigh, or hip brought on by ambulation:    Pain in feet at night that wakes you up from your sleep:      Blood clot in your veins:    Leg swelling:         Pulmonary    Oxygen at home:    Productive cough:     Wheezing:         Neurologic    Sudden weakness in arms or legs:     Sudden numbness in arms or legs:     Sudden onset of difficulty speaking or slurred speech:    Temporary loss of vision in one eye:     Problems with dizziness:         Gastrointestinal    Blood in stool:     Vomited blood:         Genitourinary    Burning when urinating:     Blood in urine:        Psychiatric    Major depression:         Hematologic    Bleeding problems:    Problems with blood clotting too easily:        Skin    Rashes or ulcers:        Constitutional    Fever or chills:      PHYSICAL EXAM:   Vitals:   07/23/21 1200  BP: (!) 144/57  Pulse: 68  Resp: 20  Temp: 98 F (36.7 C)  SpO2: 94%  Weight: 141 lb (64 kg)  Height: 5\' 3"  (1.6 m)    GENERAL: The patient is a well-nourished male, in no acute distress. The vital signs are documented above. CARDIAC: There is a regular rate and rhythm.  PULMONARY: Non-labored respirations ABDOMEN: Soft and non-tender   MUSCULOSKELETAL: There are no major deformities or cyanosis. NEUROLOGIC: No focal weakness or paresthesias are detected. SKIN: There are no ulcers or rashes noted. PSYCHIATRIC: The patient has a normal affect.  STUDIES:   I have reviewed the following: +-------+-----------+-----------+------------+------------+   ABI/TBI Today's ABI Today's TBI Previous ABI Previous TBI   +-------+-----------+-----------+------------+------------+   Right   0.64        0.41                                    +-------+-----------+-----------+------------+------------+   Left    [place]          0.95                                    +-------+-----------+-----------+------------+------------+  Right toe pressure:  60 Left toe pressure:  136 Right: Possible occlusion at the CFA bifurcation. PFA appears occluded.  Elevated velocity  in the distal SFA is 50-74% stenosis by velocity  criteria only.   Left: 75-99% stenosis in the mid SFA. The ATA appears occluded.   CAROTID: Right Carotid: Velocities in the right ICA are consistent with a 1-39%  stenosis.                 The ECA appears >50% stenosed.   Left Carotid: Evidence consistent with a total occlusion of the left ICA.  The                CCA appears occluded. ECA is occluded proximally with distal                filling via branches.   CTA: VASCULAR   1. Similar appearing fusiform infrarenal abdominal aortoiliac aneurysm measuring up to 3.8 cm in the infrarenal aorta and 4.1 cm in the left common iliac artery. Recommend follow-up every 12 months and vascular consultation. This recommendation follows ACR consensus guidelines: White Paper of the ACR Incidental Findings Committee II on Vascular Findings. J Am Coll Radiol 2013; 10:789-794. 2. 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   NON-VASCULAR   1. No acute abdominopelvic abnormality. 2.  Emphysema (ICD10-J43.9).  MEDICAL ISSUES:   CAROTID: Known left carotid occlusion.  Right carotid remains relatively disease-free.  We will repeat images in 1 year  AAA: Maximum aortic diameter is 3.8 cm.  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  I discussed the possibility of repair of his large left iliac aneurysm, however the patient has really struggled following his recent biliary issues and is not interested in any more surgeries as he feels like his quality of life has significantly declined.  I told him that he is at risk for rupture given the size of his iliac aneurysm.  Despite this, he does not wish to proceed with repair.  He is in agreement with continued surveillance.  I will have him follow-up in 6 months with a repeat ultrasound.  His daughter was present for his visit, and she is also in agreement with this plan.    Leia Alf, MD, FACS [facility] [phone] Pager [phone]

## 2021-07-27 ENCOUNTER — Other Ambulatory Visit: Payer: Self-pay | Admitting: *Deleted

## 2021-07-27 DIAGNOSIS — I7143 Infrarenal abdominal aortic aneurysm, without rupture: Secondary | ICD-10-CM

## 2021-12-01 NOTE — Progress Notes (Unsigned)
Cardiology Office Note   Date:  12/01/2021   ID:  Juan Benjamin, Juan Benjamin [birth year], MRN [id number]  PCP:  Aletha Halim., PA-C  Cardiologist:   Minus Breeding, MD   No chief complaint on file.     History of Present Illness: Juan Benjamin is a 88 y.o. male who presents for evaluation of CAD.  ***   ***   He returns for follow up .  He was in the hospital a couple of weeks ago with acute cholecystitis.  I reviewed these records for this visit.    He had PAF.    He had an echo during that admission with a normal EF.  AS was moderate.  The mean gradient was 29 mm.   I reviewed these records for this visit.    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  He actually did ultimately well and has been working with physical therapy and wants to get back on a treadmill. The patient denies any new symptoms such as chest discomfort, neck or arm discomfort. There has been no new shortness of breath, PND or orthopnea. There have been no reported palpitations, presyncope or syncope.    Past Medical History:  Diagnosis Date   AAA (abdominal aortic aneurysm)    Aortic stenosis    Carotid artery occlusion    Chronic occlusion of left, R CEA   Coronary artery disease    GERD (gastroesophageal reflux disease)    Hyperlipidemia    Hypertension    Hypothyroidism    PAF (paroxysmal atrial fibrillation) (HCC)    Pneumonia    Prostate cancer [facility])     Past Surgical History:  Procedure Laterality Date   BILIARY STENT PLACEMENT  09/29/2020   Procedure: BILIARY STENT PLACEMENT;  Surgeon: Irving Copas., MD;  Location: [place];  Service: Gastroenterology;;   BILIARY STENT PLACEMENT N/A 12/15/2020   Procedure: BILIARY STENT PLACEMENT;  Surgeon: Irving Copas., MD;  Location: [facility];  Service: Gastroenterology;  Laterality: N/A;   BIOPSY  09/29/2020   Procedure: BIOPSY;  Surgeon: Rush Landmark Telford Nab., MD;  Location: [place];  Service: Gastroenterology;;   CAROTID ARTERY SURGERY     Right    CHOLECYSTECTOMY N/A 09/26/2020   Procedure: LAPAROSCOPIC SUBTOTAL CHOLECYSTECTOMY;  Surgeon: Jesusita Oka, MD;  Location: [place];  Service: General;  Laterality: N/A;   CORONARY ARTERY BYPASS GRAFT     CABG (4 vessel, 2000.  No report in [place])   ENDOSCOPIC RETROGRADE CHOLANGIOPANCREATOGRAPHY (ERCP) WITH PROPOFOL N/A 09/29/2020   Procedure: ENDOSCOPIC RETROGRADE CHOLANGIOPANCREATOGRAPHY (ERCP) WITH PROPOFOL;  Surgeon: Rush Landmark Telford Nab., MD;  Location: [place];  Service: Gastroenterology;  Laterality: N/A;   ENDOSCOPIC RETROGRADE CHOLANGIOPANCREATOGRAPHY (ERCP) WITH PROPOFOL N/A 12/15/2020   Procedure: ENDOSCOPIC RETROGRADE CHOLANGIOPANCREATOGRAPHY (ERCP) WITH PROPOFOL;  Surgeon: Rush Landmark Telford Nab., MD;  Location: [facility];  Service: Gastroenterology;  Laterality: N/A;   ENDOSCOPIC RETROGRADE CHOLANGIOPANCREATOGRAPHY (ERCP) WITH PROPOFOL N/A 03/12/2021   Procedure: ENDOSCOPIC RETROGRADE CHOLANGIOPANCREATOGRAPHY (ERCP) WITH PROPOFOL;  Surgeon: Rush Landmark Telford Nab., MD;  Location: [facility];  Service: Gastroenterology;  Laterality: N/A;   HEMOSTASIS CLIP PLACEMENT  12/15/2020   Procedure: HEMOSTASIS CLIP PLACEMENT;  Surgeon: Rush Landmark Telford Nab., MD;  Location: [facility];  Service: Gastroenterology;;   MELANOMA EXCISION WITH SENTINEL LYMPH NODE BIOPSY Left 03/21/2020   Procedure: WIDE LOCAL EXCISION LEFT UPPER ARM MELANOMA ADVANCEMENT FLAP CLOSURE FOR DEFECT 4X8CM SENTINEL LYMPH NODE MAPPING AND  BIOPSY;  Surgeon: Stark Klein, MD;  Location: [place];  Service: General;  Laterality: Left;   PANCREATIC STENT PLACEMENT  09/29/2020   Procedure: [place];  Surgeon: Irving Copas., MD;  Location: [place];  Service: Gastroenterology;;   REMOVAL OF STONES  09/29/2020   Procedure: REMOVAL OF STONES;  Surgeon: Irving Copas., MD;  Location: [place];  Service:  Gastroenterology;;   REMOVAL OF STONES  12/15/2020   Procedure: REMOVAL OF STONES;  Surgeon: Irving Copas., MD;  Location: [facility];  Service: Gastroenterology;;   REMOVAL OF STONES  03/12/2021   Procedure: REMOVAL OF SLUDGE;  Surgeon: Irving Copas., MD;  Location: [facility];  Service: Gastroenterology;;   RENAL ARTERY STENT  07/02/1999   RIGHT SIDE   SPHINCTEROTOMY  09/29/2020   Procedure: SPHINCTEROTOMY;  Surgeon: Mansouraty, Telford Nab., MD;  Location: [place];  Service: Gastroenterology;;   Lavell Islam REMOVAL  12/15/2020   Procedure: STENT REMOVAL;  Surgeon: Irving Copas., MD;  Location: [facility];  Service: Gastroenterology;;   Lavell Islam REMOVAL  03/12/2021   Procedure: STENT REMOVAL;  Surgeon: Irving Copas., MD;  Location: [facility];  Service: Gastroenterology;;     Current Outpatient Medications  Medication Sig Dispense Refill   acetaminophen (TYLENOL) 325 MG tablet Take 325-650 mg by mouth every 6 (six) hours as needed for moderate pain.     allopurinol (ZYLOPRIM) 100 MG tablet Take 100 mg by mouth at bedtime.      amLODipine (NORVASC) 10 MG tablet Take 10 mg by mouth daily.     aspirin 81 MG tablet Take 1 tablet (81 mg total) by mouth daily. (Patient taking differently: Take 81 mg by mouth at bedtime.) 90 tablet 3   Cholecalciferol (VITAMIN D) 50 MCG (2000 UT) tablet Take 2,000 Units by mouth daily.     diphenhydrAMINE (BENADRYL) 25 MG tablet Take 1 tablet by mouth daily as needed (Bee sting).     EPINEPHrine 0.3 mg/0.3 mL IJ SOAJ injection Inject 0.3 mLs (0.3 mg total) into the muscle as needed for anaphylaxis. 1 each 0   levothyroxine (SYNTHROID) 88 MCG tablet      Methylcobalamin 1000 MCG TBDP Take 1,000 mcg by mouth daily.     propranolol (INDERAL) 40 MG tablet Take 40 mg by mouth every evening.     rosuvastatin (CRESTOR) 10 MG tablet Take 10 mg by mouth daily.     zinc oxide (BALMEX) 11.3 % CREA cream Apply 1 application topically  daily as needed (pressure spot).     No current facility-administered medications for this visit.    Allergies:   Bee venom, Wasp venom, Crestor [rosuvastatin], Gemfibrozil, Indomethacin, Lipitor [atorvastatin], Lisinopril, Rofecoxib, Sildenafil, and Simvastatin    ROS:  Please see the history of present illness.   Otherwise, review of systems are positive for ***.   All other systems are reviewed and negative.    PHYSICAL EXAM: VS:  There were no vitals taken for this visit. , BMI There is no height or weight on file to calculate BMI. GENERAL:  Well appearing NECK:  No jugular venous distention, waveform within normal limits, carotid upstroke brisk and symmetric, no bruits, no thyromegaly LUNGS:  Clear to auscultation bilaterally CHEST:  Unremarkable HEART:  PMI not displaced or sustained,S1 and S2 within normal limits, no S3, no S4, no clicks, no rubs, *** murmurs ABD:  Flat, positive bowel sounds normal in frequency in pitch, no bruits, no rebound, no guarding, no midline pulsatile mass, no  hepatomegaly, no splenomegaly EXT:  2 plus pulses throughout, no edema, no cyanosis no clubbing      ***GENERAL:  Well appearing NECK:  No jugular venous distention, waveform within normal limits, carotid upstroke brisk and symmetric, bilateral bruits, no thyromegaly LUNGS:  Clear to auscultation bilaterally CHEST:  Unremarkable HEART:  PMI not displaced or sustained,S1 and S2 within normal limits, no S3, no S4, no clicks, no rubs, 3 out of 6 apical systolic murmur radiating out the aortic outflow tract and mid peaking, no diastolic murmurs ABD:  Flat, positive bowel sounds normal in frequency in pitch, no bruits, no rebound, no guarding, no midline pulsatile mass, no hepatomegaly, no splenomegaly EXT:  2 plus pulses upper and absent dorsalis pedis and posterior tibialis bilateral, no edema, no cyanosis no clubbing  EKG:  EKG is  *** ordered today. The ekg ordered today demonstrates sinus  bradycardia, rate *** old inferior infarct, old anteroseptal infarct, premature atrial contractions, no acute ST-T wave changes.  No change from previous   Recent Labs: 03/12/2021: BUN 31; Creatinine, Ser 1.90; Hemoglobin 14.3; Potassium 4.5; Sodium 141    Lipid Panel    Component Value Date/Time   CHOL  11/08/2008 0420    129        ATP III CLASSIFICATION:  <200     mg/dL   Desirable  200-239  mg/dL   Borderline High  >=240    mg/dL   High          TRIG 77 11/08/2008 0420   HDL 34 (L) 11/08/2008 0420   CHOLHDL 3.8 11/08/2008 0420   VLDL 15 11/08/2008 0420   LDLCALC  11/08/2008 0420    80        Total Cholesterol/HDL:CHD Risk Coronary Heart Disease Risk Table                     Men   Women  1/2 Average Risk   3.4   3.3  Average Risk       5.0   4.4  2 X Average Risk   9.6   7.1  3 X Average Risk  23.4   11.0        Use the calculated Patient Ratio above and the CHD Risk Table to determine the patient's CHD Risk.        ATP III CLASSIFICATION (LDL):  <100     mg/dL   Optimal  100-129  mg/dL   Near or Above                    Optimal  130-159  mg/dL   Borderline  160-189  mg/dL   High  >190     mg/dL   Very High      Wt Readings from Last 3 Encounters:  07/23/21 141 lb (64 kg)  03/12/21 143 lb (64.9 kg)  01/01/21 137 lb (62.1 kg)      Other studies Reviewed: Additional studies/ records that were reviewed today include: *** Review of the above records demonstrates:  Please see elsewhere in the note.     ASSESSMENT AND PLAN:  Coronary artery disease -  ***  He had no evidence of ischemia despite his recent acute illness.  No change in therapy.   Carotid artery occlusion -  He has an occluded left carotid and less than 50% in the right in Feb 2023.  ***    We will follow this with serial exams and I can  defer to Dr. Trula Slade who will be seeing him for his iliac aneurysms.  Hyperlipidemia -  He has not tolerated statins more than the 10 mg of Crestor.  ***   His LDL was 100.  He would prefer not to be on a PCSK9 inhibitor and I think this is reasonable.  AS-  This was moderate in May 2022.  ***    I will follow this clinically.   PAF -  I had a long conversation with his daughter.  This episode of atrial fibrillation was related to his acute illness and I do not think this relegated him to lifelong anticoagulation and they are happy with this discussion.   HTN  His lisinopril was stopped and his amlodipine increased recently.  No change in therapy.  CKD IIIB Creatinine is 1.7 which is mildly higher than previous and we discussed hydration.   Current medicines are reviewed at length with the patient today.  The patient does not have concerns regarding medicines.  The following changes have been made:   ***  Labs/ tests ordered today include:  ***  No orders of the defined types were placed in this encounter.    Disposition:   FU with me in ***

## 2021-12-03 ENCOUNTER — Encounter: Payer: Self-pay | Admitting: Cardiology

## 2021-12-03 ENCOUNTER — Ambulatory Visit: Payer: PPO | Admitting: Cardiology

## 2021-12-03 VITALS — BP 140/62 | HR 53 | Ht 67.0 in | Wt 135.2 lb

## 2021-12-03 DIAGNOSIS — I350 Nonrheumatic aortic (valve) stenosis: Secondary | ICD-10-CM | POA: Diagnosis not present

## 2021-12-03 DIAGNOSIS — I2510 Atherosclerotic heart disease of native coronary artery without angina pectoris: Secondary | ICD-10-CM | POA: Diagnosis not present

## 2021-12-03 DIAGNOSIS — I10 Essential (primary) hypertension: Secondary | ICD-10-CM

## 2021-12-03 DIAGNOSIS — I6522 Occlusion and stenosis of left carotid artery: Secondary | ICD-10-CM

## 2021-12-03 DIAGNOSIS — E785 Hyperlipidemia, unspecified: Secondary | ICD-10-CM | POA: Diagnosis not present

## 2021-12-03 DIAGNOSIS — I480 Paroxysmal atrial fibrillation: Secondary | ICD-10-CM

## 2021-12-03 DIAGNOSIS — N1832 Chronic kidney disease, stage 3b: Secondary | ICD-10-CM

## 2021-12-03 NOTE — Patient Instructions (Signed)
  Testing/Procedures:  Your physician has requested that you have an echocardiogram. Echocardiography is a painless test that uses sound waves to create images of your heart. It provides your doctor with information about the size and shape of your heart and how well your heart's chambers and valves are working. This procedure takes approximately one hour. There are no restrictions for this procedure. [place]   Follow-Up: At [facility], you and your health needs are our priority.  As part of our continuing mission to provide you with exceptional heart care, we have created designated Provider Care Teams.  These Care Teams include your primary Cardiologist (physician) and Advanced Practice Providers (APPs -  Physician Assistants and Nurse Practitioners) who all work together to provide you with the care you need, when you need it.  We recommend signing up for the patient portal called "MyChart".  Sign up information is provided on this After Visit Summary.  MyChart is used to connect with patients for Virtual Visits (Telemedicine).  Patients are able to view lab/test results, encounter notes, upcoming appointments, etc.  Non-urgent messages can be sent to your provider as well.   To learn more about what you can do with MyChart, go to [email or web address].    Your next appointment:   12 month(s)  The format for your next appointment:   In Person  Provider:   Minus Breeding, MD       Important Information About Sugar

## 2021-12-17 ENCOUNTER — Ambulatory Visit (HOSPITAL_COMMUNITY): Payer: PPO | Attending: Cardiology

## 2021-12-17 ENCOUNTER — Other Ambulatory Visit (HOSPITAL_COMMUNITY): Payer: PPO

## 2021-12-17 DIAGNOSIS — I350 Nonrheumatic aortic (valve) stenosis: Secondary | ICD-10-CM | POA: Diagnosis not present

## 2021-12-17 LAB — ECHOCARDIOGRAM COMPLETE
AR max vel: 0.73 cm2
AV Area VTI: 0.74 cm2
AV Area mean vel: 0.7 cm2
AV Mean grad: 31 mmHg
AV Peak grad: 51.2 mmHg
Ao pk vel: 3.58 m/s
Area-P 1/2: 2.27 cm2
P 1/2 time: 403 msec
S' Lateral: 3.3 cm

## 2021-12-31 ENCOUNTER — Encounter: Payer: Self-pay | Admitting: *Deleted

## 2022-04-25 ENCOUNTER — Encounter (HOSPITAL_COMMUNITY): Payer: PPO

## 2022-08-10 ENCOUNTER — Emergency Department (HOSPITAL_BASED_OUTPATIENT_CLINIC_OR_DEPARTMENT_OTHER): Payer: PPO | Admitting: Radiology

## 2022-08-10 ENCOUNTER — Other Ambulatory Visit: Payer: Self-pay

## 2022-08-10 ENCOUNTER — Emergency Department (HOSPITAL_BASED_OUTPATIENT_CLINIC_OR_DEPARTMENT_OTHER)
Admission: EM | Admit: 2022-08-10 | Discharge: 2022-08-10 | Disposition: A | Discharge status: Home / Self Care | Payer: PPO | Attending: Emergency Medicine | Admitting: Emergency Medicine

## 2022-08-10 ENCOUNTER — Emergency Department (HOSPITAL_BASED_OUTPATIENT_CLINIC_OR_DEPARTMENT_OTHER): Payer: PPO

## 2022-08-10 ENCOUNTER — Encounter (HOSPITAL_BASED_OUTPATIENT_CLINIC_OR_DEPARTMENT_OTHER): Payer: Self-pay | Admitting: Emergency Medicine

## 2022-08-10 DIAGNOSIS — R7989 Other specified abnormal findings of blood chemistry: Secondary | ICD-10-CM | POA: Diagnosis not present

## 2022-08-10 DIAGNOSIS — Z8546 Personal history of malignant neoplasm of prostate: Secondary | ICD-10-CM | POA: Diagnosis not present

## 2022-08-10 DIAGNOSIS — I110 Hypertensive heart disease with heart failure: Secondary | ICD-10-CM | POA: Diagnosis not present

## 2022-08-10 DIAGNOSIS — Z7982 Long term (current) use of aspirin: Secondary | ICD-10-CM | POA: Diagnosis not present

## 2022-08-10 DIAGNOSIS — J189 Pneumonia, unspecified organism: Secondary | ICD-10-CM | POA: Diagnosis not present

## 2022-08-10 DIAGNOSIS — Y9301 Activity, walking, marching and hiking: Secondary | ICD-10-CM | POA: Insufficient documentation

## 2022-08-10 DIAGNOSIS — Z1152 Encounter for screening for COVID-19: Secondary | ICD-10-CM | POA: Diagnosis not present

## 2022-08-10 DIAGNOSIS — I2510 Atherosclerotic heart disease of native coronary artery without angina pectoris: Secondary | ICD-10-CM | POA: Diagnosis not present

## 2022-08-10 DIAGNOSIS — R9082 White matter disease, unspecified: Secondary | ICD-10-CM | POA: Diagnosis not present

## 2022-08-10 DIAGNOSIS — I509 Heart failure, unspecified: Secondary | ICD-10-CM | POA: Insufficient documentation

## 2022-08-10 DIAGNOSIS — R059 Cough, unspecified: Secondary | ICD-10-CM | POA: Diagnosis present

## 2022-08-10 DIAGNOSIS — M25561 Pain in right knee: Secondary | ICD-10-CM | POA: Diagnosis not present

## 2022-08-10 DIAGNOSIS — W109XXA Fall (on) (from) unspecified stairs and steps, initial encounter: Secondary | ICD-10-CM | POA: Insufficient documentation

## 2022-08-10 DIAGNOSIS — M25562 Pain in left knee: Secondary | ICD-10-CM | POA: Diagnosis not present

## 2022-08-10 DIAGNOSIS — Z79899 Other long term (current) drug therapy: Secondary | ICD-10-CM | POA: Insufficient documentation

## 2022-08-10 LAB — RESP PANEL BY RT-PCR (RSV, FLU A&B, COVID)  RVPGX2
Influenza A by PCR: NEGATIVE
Influenza B by PCR: NEGATIVE
Resp Syncytial Virus by PCR: NEGATIVE
SARS Coronavirus 2 by RT PCR: NEGATIVE

## 2022-08-10 LAB — BASIC METABOLIC PANEL
Anion gap: 10 (ref 5–15)
BUN: 43 mg/dL — ABNORMAL HIGH (ref 8–23)
CO2: 23 mmol/L (ref 22–32)
Calcium: 10.3 mg/dL (ref 8.9–10.3)
Chloride: 111 mmol/L (ref 98–111)
Creatinine, Ser: 1.99 mg/dL — ABNORMAL HIGH (ref 0.61–1.24)
GFR, Estimated: 32 mL/min — ABNORMAL LOW (ref >60)
Glucose, Bld: 136 mg/dL — ABNORMAL HIGH (ref 70–99)
Potassium: 3.7 mmol/L (ref 3.5–5.1)
Sodium: 144 mmol/L (ref 135–145)

## 2022-08-10 LAB — BRAIN NATRIURETIC PEPTIDE: B Natriuretic Peptide: 997.2 pg/mL — ABNORMAL HIGH (ref 0.0–100.0)

## 2022-08-10 LAB — CBC WITH DIFFERENTIAL/PLATELET
Abs Immature Granulocytes: 0.14 10*3/uL — ABNORMAL HIGH (ref 0.00–0.07)
Basophils Absolute: 0 10*3/uL (ref 0.0–0.1)
Basophils Relative: 0 %
Eosinophils Absolute: 0 10*3/uL (ref 0.0–0.5)
Eosinophils Relative: 0 %
HCT: 42.6 % (ref 39.0–52.0)
Hemoglobin: 14.3 g/dL (ref 13.0–17.0)
Immature Granulocytes: 1 %
Lymphocytes Relative: 5 %
Lymphs Abs: 0.9 10*3/uL (ref 0.7–4.0)
MCH: 29.2 pg (ref 26.0–34.0)
MCHC: 33.6 g/dL (ref 30.0–36.0)
MCV: 87.1 fL (ref 80.0–100.0)
Monocytes Absolute: 1.2 10*3/uL — ABNORMAL HIGH (ref 0.1–1.0)
Monocytes Relative: 6 %
Neutro Abs: 17 10*3/uL — ABNORMAL HIGH (ref 1.7–7.7)
Neutrophils Relative %: 88 %
Platelets: 213 10*3/uL (ref 150–400)
RBC: 4.89 MIL/uL (ref 4.22–5.81)
RDW: 14.9 % (ref 11.5–15.5)
WBC: 19.2 10*3/uL — ABNORMAL HIGH (ref 4.0–10.5)
nRBC: 0 % (ref 0.0–0.2)

## 2022-08-10 LAB — TROPONIN I (HIGH SENSITIVITY)
Troponin I (High Sensitivity): 33 ng/L — ABNORMAL HIGH (ref <18)
Troponin I (High Sensitivity): 31 ng/L — ABNORMAL HIGH (ref <18)

## 2022-08-10 MED ORDER — DOXYCYCLINE HYCLATE 100 MG PO TABS
100.0000 mg | ORAL_TABLET | Freq: Once | ORAL | Status: DC
  Filled 2022-08-10: qty 1

## 2022-08-10 MED ORDER — DOXYCYCLINE HYCLATE 100 MG PO CAPS: 100.0000 mg | ORAL_CAPSULE | Freq: Two times a day (BID) | ORAL | 0 refills | Status: DC

## 2022-08-10 MED ORDER — FUROSEMIDE 10 MG/ML IJ SOLN
40.0000 mg | Freq: Once | INTRAMUSCULAR | Status: AC
  Administered 2022-08-10: 40 mg via INTRAVENOUS
  Filled 2022-08-10: qty 4

## 2022-08-10 NOTE — Discharge Instructions (Signed)
Take next dose of antibiotics tomorrow

## 2022-08-10 NOTE — ED Notes (Signed)
Patient transported to CT 

## 2022-08-10 NOTE — ED Triage Notes (Signed)
Pt from home via [facility] with reports of cough and weakness. PT reports multiple falls the [past 2 days. Pt reports he has an injured left knee that is causing the falls. Pt received 5mg  albuterol en route.

## 2022-08-10 NOTE — ED Provider Notes (Signed)
[facility] Provider Note   CSN: [id number] Arrival date & time: 08/10/22  1532     History  Chief Complaint  Patient presents with   Cough    Juan Benjamin is a 89 y.o. male.  Patient here with cough.  Had a fall as well today.  He had a cough for couple weeks.  He fell walking on the stairs today.  States his knees gave out.  Does not think he hit his head or lost consciousness.  Having pain in both knees.  Denies being on blood thinners.  History of CAD, hypertension, paroxysmal A-fib, AAA, prostate cancer.  EMS put him on a nonrebreather because he had possibly wheezing.  Has any leg swelling.  Denies any fevers or chills.  He has had productive cough.  The history is provided by the patient.       Home Medications Prior to Admission medications   Medication Sig Start Date End Date Taking? Authorizing Provider  doxycycline (VIBRAMYCIN) 100 MG capsule Take 1 capsule (100 mg total) by mouth 2 (two) times daily. 08/10/22  Yes Coltrane Tugwell, DO  acetaminophen (TYLENOL) 325 MG tablet Take 325-650 mg by mouth every 6 (six) hours as needed for moderate pain.    [provider]  allopurinol (ZYLOPRIM) 100 MG tablet Take 100 mg by mouth at bedtime.     [provider]  amLODipine (NORVASC) 10 MG tablet Take 10 mg by mouth daily.    [provider]  aspirin 81 MG tablet Take 1 tablet (81 mg total) by mouth daily. Patient taking differently: Take 81 mg by mouth at bedtime. 02/24/20   Minus Breeding, MD  Cholecalciferol (VITAMIN D) 50 MCG (2000 UT) tablet Take 2,000 Units by mouth daily.    [provider]  diphenhydrAMINE (BENADRYL) 25 MG tablet Take 1 tablet by mouth daily as needed (Bee sting).    [provider]  EPINEPHrine 0.3 mg/0.3 mL IJ SOAJ injection Inject 0.3 mLs (0.3 mg total) into the muscle as needed for anaphylaxis. 12/16/19   Domenic Moras, PA-C  levothyroxine (SYNTHROID) 88 MCG  tablet Take 75 mcg by mouth daily before breakfast.  75 MG    [provider]  Methylcobalamin 1000 MCG TBDP Take 1,000 mcg by mouth daily.    [provider]  propranolol (INDERAL) 40 MG tablet Take 40 mg by mouth every evening.    [provider]  rosuvastatin (CRESTOR) 10 MG tablet Take 10 mg by mouth daily.    [provider]  zinc oxide (BALMEX) 11.3 % CREA cream Apply 1 application topically daily as needed (pressure spot).    [provider]  ranitidine (ZANTAC) 150 MG tablet Take 1 tablet (150 mg total) by mouth 2 (two) times daily. Patient not taking: Reported on 01/20/2017 11/28/16 12/16/19  Palumbo, April, MD      Allergies    Bee venom, Wasp venom, Crestor [rosuvastatin], Gemfibrozil, Indomethacin, Lipitor [atorvastatin], Lisinopril, Rofecoxib, Sildenafil, and Simvastatin    Review of Systems   Review of Systems  Physical Exam Updated Vital Signs BP (!) 149/80   Pulse 65   Temp 97.8 F (36.6 C) (Oral)   Resp 18   SpO2 92%  Physical Exam Vitals and nursing note reviewed.  Constitutional:      General: He is not in acute distress.    Appearance: He is well-developed. He is not ill-appearing.  HENT:     Head: Normocephalic and atraumatic.  Nose: Nose normal.     Mouth/Throat:     Mouth: Mucous membranes are moist.  Eyes:     Extraocular Movements: Extraocular movements intact.     Conjunctiva/sclera: Conjunctivae normal.     Pupils: Pupils are equal, round, and reactive to light.  Cardiovascular:     Rate and Rhythm: Normal rate and regular rhythm.     Pulses: Normal pulses.     Heart sounds: Normal heart sounds. No murmur heard. Pulmonary:     Effort: Pulmonary effort is normal. No respiratory distress.     Breath sounds: Normal breath sounds.  Abdominal:     Palpations: Abdomen is soft.     Tenderness: There is no abdominal tenderness.  Musculoskeletal:        General: Tenderness present. No swelling. Normal range  of motion.     Cervical back: Normal range of motion and neck supple.     Comments: Tenderness to both knees  Skin:    General: Skin is warm and dry.     Capillary Refill: Capillary refill takes less than 2 seconds.  Neurological:     General: No focal deficit present.     Mental Status: He is alert.  Psychiatric:        Mood and Affect: Mood normal.     ED Results / Procedures / Treatments   Labs (all labs ordered are listed, but only abnormal results are displayed) Labs Reviewed  CBC WITH DIFFERENTIAL/PLATELET - Abnormal; Notable for the following components:      Result Value   WBC 19.2 (*)    Neutro Abs 17.0 (*)    Monocytes Absolute 1.2 (*)    Abs Immature Granulocytes 0.14 (*)    All other components within normal limits  BASIC METABOLIC PANEL - Abnormal; Notable for the following components:   Glucose, Bld 136 (*)    BUN 43 (*)    Creatinine, Ser 1.99 (*)    GFR, Estimated 32 (*)    All other components within normal limits  BRAIN NATRIURETIC PEPTIDE - Abnormal; Notable for the following components:   B Natriuretic Peptide 997.2 (*)    All other components within normal limits  TROPONIN I (HIGH SENSITIVITY) - Abnormal; Notable for the following components:   Troponin I (High Sensitivity) 33 (*)    All other components within normal limits  TROPONIN I (HIGH SENSITIVITY) - Abnormal; Notable for the following components:   Troponin I (High Sensitivity) 31 (*)    All other components within normal limits  RESP PANEL BY RT-PCR (RSV, FLU A&B, COVID)  [id number]    EKG EKG Interpretation  Date/Time:  Saturday August 10 2022 17:37:41 EDT Ventricular Rate:  64 PR Interval:  174 QRS Duration: 108 QT Interval:  423 QTC Calculation: 437 R Axis:   29 Text Interpretation: Sinus rhythm Confirmed by Lennice Sites ([id number]) on 08/10/2022 5:40:28 PM  Radiology CT Chest Wo Contrast  Result Date: 08/10/2022 CLINICAL DATA:  Cough, concern for pneumonia. EXAM: CT CHEST WITHOUT  CONTRAST TECHNIQUE: Multidetector CT imaging of the chest was performed following the standard protocol without IV contrast. RADIATION DOSE REDUCTION: This exam was performed according to the departmental dose-optimization program which includes automated exposure control, adjustment of the mA and/or kV according to patient size and/or use of iterative reconstruction technique. COMPARISON:  Same day chest radiograph and CT chest dated 12/27/2020. FINDINGS: Cardiovascular: Vascular calcifications are seen in the coronary arteries and aortic arch. Normal heart size. No pericardial effusion. Mediastinum/Nodes: No  enlarged mediastinal or axillary lymph nodes. Thyroid gland, trachea, and esophagus demonstrate no significant findings. Lungs/Pleura: Mild bilateral lower lobe and right middle lobe ground-glass opacities and atelectasis are noted. There is mild scarring/atelectasis in the left upper lobe. There is bilateral lower lobe bronchiectasis. Pleuroparenchymal scarring and calcifications are seen on the left. No pleural effusion or pneumothorax on either side. Mild emphysematous changes are redemonstrated. Upper Abdomen: Pneumobilia is noted. The previously seen common bile duct stent is not identified on today's exam and has likely been removed. Musculoskeletal: Degenerative changes are seen in the spine. IMPRESSION: 1. Mild bilateral lower lobe and right middle lobe ground-glass opacities and atelectasis may reflect pneumonia. 2. Bilateral lower lobe bronchiectasis likely reflects chronic aspiration. 3. Pneumobilia. The previously seen common bile duct stent is not identified on today's exam and has likely been removed. Aortic Atherosclerosis (ICD10-I70.0). Electronically Signed   By: Zerita Boers M.D.   On: 08/10/2022 18:27   CT Cervical Spine Wo Contrast  Result Date: 08/10/2022 CLINICAL DATA:  Poly trauma.  Multiple falls. EXAM: CT CERVICAL SPINE WITHOUT CONTRAST TECHNIQUE: Multidetector CT imaging of the  cervical spine was performed without intravenous contrast. Multiplanar CT image reconstructions were also generated. RADIATION DOSE REDUCTION: This exam was performed according to the departmental dose-optimization program which includes automated exposure control, adjustment of the mA and/or kV according to patient size and/or use of iterative reconstruction technique. COMPARISON:  None Available. FINDINGS: Alignment: Normal alignment of the cervical vertebral bodies. Skull base and vertebrae: Normal craniocervical junction. No loss of vertebral body height or disc height. Normal facet articulation. No evidence of fracture. Soft tissues and spinal canal: No prevertebral soft tissue swelling. No perispinal or epidural hematoma. Disc levels: Disc space narrowing at C4-C5 with endplate sclerosis. No acute findings Upper chest: Clear Other: None IMPRESSION: 1. No cervical spine fracture. 2. Cervical spondylosis. Electronically Signed   By: Suzy Bouchard M.D.   On: 08/10/2022 17:03   CT Head Wo Contrast  Result Date: 08/10/2022 CLINICAL DATA:  Cough, weakness, multiple falls EXAM: CT HEAD WITHOUT CONTRAST TECHNIQUE: Contiguous axial images were obtained from the base of the skull through the vertex without intravenous contrast. RADIATION DOSE REDUCTION: This exam was performed according to the departmental dose-optimization program which includes automated exposure control, adjustment of the mA and/or kV according to patient size and/or use of iterative reconstruction technique. COMPARISON:  10/06/2012 FINDINGS: Brain: No evidence of acute infarction, hemorrhage, hydrocephalus, extra-axial collection or mass lesion/mass effect. Mild periventricular white matter hypodensity and global cerebral volume loss. Vascular: No hyperdense vessel or unexpected calcification. Skull: Normal. Negative for fracture or focal lesion. Sinuses/Orbits: No acute finding. Other: None. IMPRESSION: No acute intracranial pathology.  Small-vessel white matter disease and global cerebral volume loss. Electronically Signed   By: Delanna Ahmadi M.D.   On: 08/10/2022 17:03   DG Knee Complete 4 Views Right  Result Date: 08/10/2022 CLINICAL DATA:  Multiple falls over the last few days. EXAM: RIGHT KNEE - COMPLETE 4+ VIEW COMPARISON:  None Available. FINDINGS: The bones are subjectively under mineralized. No acute or healing fracture. Normal alignment. Mild tricompartmental peripheral spurring. No significant knee joint effusion. Mild chondrocalcinosis. No erosive change. Dense vascular calcifications. IMPRESSION: 1. No fracture or dislocation of the right knee. 2. Mild osteoarthritis and chondrocalcinosis. Electronically Signed   By: Keith Rake M.D.   On: 08/10/2022 16:29   DG Chest 1 View  Result Date: 08/10/2022 CLINICAL DATA:  Cough and adult. Weakness. EXAM: CHEST  1 VIEW  COMPARISON:  09/24/2020, CT 12/27/2020 FINDINGS: Prior median sternotomy and CABG. Stable heart size and mediastinal contours. Chronic left lung scarring and blunting of the costophrenic angle. There left-sided in pleural hyperdensities/calcifications that were seen on prior CT. No acute airspace disease. No pulmonary edema. No pneumothorax. No large pleural effusion on limited assessment, no acute osseous findings. IMPRESSION: Chronic left lung scarring and pleural calcifications. No acute findings. Electronically Signed   By: Keith Rake M.D.   On: 08/10/2022 16:28   DG Knee Complete 4 Views Left  Result Date: 08/10/2022 CLINICAL DATA:  Left knee pain after multiple falls. EXAM: LEFT KNEE - COMPLETE 4+ VIEW COMPARISON:  None Available. FINDINGS: The bones are subjectively under mineralized. No acute or healing fracture. Mild tricompartmental osteoarthritis with peripheral spurring. Chondrocalcinosis. No significant knee joint effusion. Dense vascular calcifications. Surgical clips in the posteromedial soft tissues. IMPRESSION: 1. No fracture or subluxation  of the left knee. 2. Mild tricompartmental osteoarthritis with chondrocalcinosis. Electronically Signed   By: Keith Rake M.D.   On: 08/10/2022 16:27    Procedures Procedures    Medications Ordered in ED Medications  doxycycline (VIBRA-TABS) tablet 100 mg (has no administration in time range)  furosemide (LASIX) injection 40 mg (40 mg Intravenous Given 08/10/22 1800)    ED Course/ Medical Decision Making/ A&P                             Medical Decision Making Amount and/or Complexity of Data Reviewed Labs: ordered. Radiology: ordered.  Risk Prescription drug management.   Juan Benjamin is here with cough and fall.  History of heart failure, hypertension.  Not on blood thinners.  Overall difficult to get a history.  Will do broad workup for cough to include ACS, heart failure, pneumonia workup.  Have no concern for PE.  He is a former smoker.  Maybe some wheezing on exam.  No obvious edema.  Will get CBC, BMP, troponin, BNP, chest x-ray.  He also had a fall today, mechanical in nature.  Will get x-rays of both knees, head and neck.  Patient with white count of 19 per my review and interpretation of labs but otherwise labs are unremarkable.  BNP and troponin mildly elevated but stable.  Chest x-ray with no obvious pneumonia, CT scan chest was performed that showed per radiology report pneumonia.  Will treat with antibiotics.  He is on steroids already.  Likely why he has a white count.  He has no fever.  Have no concern for sepsis.  He is very well-appearing.  Given a dose of Lasix but overall I have no concern for cardiac process.  Will have him do course of antibiotics and follow-up with primary care doctor.  Discharged in good condition.  Trauma images per radiology report are unremarkable including head CT, neck CT and x-rays of his knees.  This chart was dictated using voice recognition software.  Despite best efforts to proofread,  errors can occur which can change the  documentation meaning.         Final Clinical Impression(s) / ED Diagnoses Final diagnoses:  Community acquired pneumonia, unspecified laterality    Rx / DC Orders ED Discharge Orders          Ordered    doxycycline (VIBRAMYCIN) 100 MG capsule  2 times daily        08/10/22 1837  Lennice Sites, DO 08/10/22 1907

## 2022-09-23 ENCOUNTER — Emergency Department (HOSPITAL_COMMUNITY): Payer: PPO

## 2022-09-23 ENCOUNTER — Inpatient Hospital Stay (HOSPITAL_COMMUNITY)
Admission: EM | Admit: 2022-09-23 | Discharge: 2022-09-30 | DRG: 177 | Disposition: A | Discharge status: Home / Self Care | Payer: PPO | Attending: Internal Medicine | Admitting: Internal Medicine

## 2022-09-23 DIAGNOSIS — Z85820 Personal history of malignant melanoma of skin: Secondary | ICD-10-CM

## 2022-09-23 DIAGNOSIS — B370 Candidal stomatitis: Secondary | ICD-10-CM | POA: Diagnosis present

## 2022-09-23 DIAGNOSIS — Z881 Allergy status to other antibiotic agents status: Secondary | ICD-10-CM

## 2022-09-23 DIAGNOSIS — I714 Abdominal aortic aneurysm, without rupture, unspecified: Secondary | ICD-10-CM | POA: Diagnosis present

## 2022-09-23 DIAGNOSIS — E039 Hypothyroidism, unspecified: Secondary | ICD-10-CM | POA: Diagnosis present

## 2022-09-23 DIAGNOSIS — Z951 Presence of aortocoronary bypass graft: Secondary | ICD-10-CM

## 2022-09-23 DIAGNOSIS — L8990 Pressure ulcer of unspecified site, unspecified stage: Secondary | ICD-10-CM | POA: Diagnosis present

## 2022-09-23 DIAGNOSIS — R627 Adult failure to thrive: Secondary | ICD-10-CM | POA: Diagnosis present

## 2022-09-23 DIAGNOSIS — R54 Age-related physical debility: Secondary | ICD-10-CM | POA: Diagnosis present

## 2022-09-23 DIAGNOSIS — N179 Acute kidney failure, unspecified: Secondary | ICD-10-CM | POA: Diagnosis present

## 2022-09-23 DIAGNOSIS — J189 Pneumonia, unspecified organism: Principal | ICD-10-CM

## 2022-09-23 DIAGNOSIS — Z91030 Bee allergy status: Secondary | ICD-10-CM

## 2022-09-23 DIAGNOSIS — I2510 Atherosclerotic heart disease of native coronary artery without angina pectoris: Secondary | ICD-10-CM | POA: Diagnosis present

## 2022-09-23 DIAGNOSIS — D72829 Elevated white blood cell count, unspecified: Secondary | ICD-10-CM | POA: Diagnosis present

## 2022-09-23 DIAGNOSIS — H9190 Unspecified hearing loss, unspecified ear: Secondary | ICD-10-CM | POA: Diagnosis present

## 2022-09-23 DIAGNOSIS — J690 Pneumonitis due to inhalation of food and vomit: Secondary | ICD-10-CM | POA: Diagnosis not present

## 2022-09-23 DIAGNOSIS — Z8546 Personal history of malignant neoplasm of prostate: Secondary | ICD-10-CM

## 2022-09-23 DIAGNOSIS — E876 Hypokalemia: Secondary | ICD-10-CM | POA: Diagnosis present

## 2022-09-23 DIAGNOSIS — Z6821 Body mass index (BMI) 21.0-21.9, adult: Secondary | ICD-10-CM

## 2022-09-23 DIAGNOSIS — Z7902 Long term (current) use of antithrombotics/antiplatelets: Secondary | ICD-10-CM

## 2022-09-23 DIAGNOSIS — Z7982 Long term (current) use of aspirin: Secondary | ICD-10-CM

## 2022-09-23 DIAGNOSIS — L89151 Pressure ulcer of sacral region, stage 1: Secondary | ICD-10-CM | POA: Diagnosis present

## 2022-09-23 DIAGNOSIS — I10 Essential (primary) hypertension: Secondary | ICD-10-CM | POA: Diagnosis present

## 2022-09-23 DIAGNOSIS — Z87891 Personal history of nicotine dependence: Secondary | ICD-10-CM

## 2022-09-23 DIAGNOSIS — E785 Hyperlipidemia, unspecified: Secondary | ICD-10-CM | POA: Diagnosis present

## 2022-09-23 DIAGNOSIS — R1310 Dysphagia, unspecified: Secondary | ICD-10-CM | POA: Diagnosis present

## 2022-09-23 DIAGNOSIS — Z91038 Other insect allergy status: Secondary | ICD-10-CM

## 2022-09-23 DIAGNOSIS — I350 Nonrheumatic aortic (valve) stenosis: Secondary | ICD-10-CM | POA: Diagnosis present

## 2022-09-23 DIAGNOSIS — R451 Restlessness and agitation: Secondary | ICD-10-CM | POA: Diagnosis not present

## 2022-09-23 DIAGNOSIS — Z79890 Hormone replacement therapy (postmenopausal): Secondary | ICD-10-CM

## 2022-09-23 DIAGNOSIS — Z888 Allergy status to other drugs, medicaments and biological substances status: Secondary | ICD-10-CM

## 2022-09-23 DIAGNOSIS — I129 Hypertensive chronic kidney disease with stage 1 through stage 4 chronic kidney disease, or unspecified chronic kidney disease: Secondary | ICD-10-CM | POA: Diagnosis present

## 2022-09-23 DIAGNOSIS — R531 Weakness: Secondary | ICD-10-CM

## 2022-09-23 DIAGNOSIS — Z66 Do not resuscitate: Secondary | ICD-10-CM | POA: Diagnosis present

## 2022-09-23 DIAGNOSIS — Z79899 Other long term (current) drug therapy: Secondary | ICD-10-CM

## 2022-09-23 DIAGNOSIS — E861 Hypovolemia: Secondary | ICD-10-CM | POA: Diagnosis present

## 2022-09-23 DIAGNOSIS — E43 Unspecified severe protein-calorie malnutrition: Secondary | ICD-10-CM | POA: Diagnosis present

## 2022-09-23 DIAGNOSIS — I480 Paroxysmal atrial fibrillation: Secondary | ICD-10-CM | POA: Diagnosis present

## 2022-09-23 DIAGNOSIS — Z7901 Long term (current) use of anticoagulants: Secondary | ICD-10-CM

## 2022-09-23 DIAGNOSIS — N184 Chronic kidney disease, stage 4 (severe): Secondary | ICD-10-CM | POA: Diagnosis present

## 2022-09-23 LAB — URINALYSIS, ROUTINE W REFLEX MICROSCOPIC
Bacteria, UA: NONE SEEN
Bilirubin Urine: NEGATIVE
Glucose, UA: NEGATIVE mg/dL
Ketones, ur: NEGATIVE mg/dL
Leukocytes,Ua: NEGATIVE
Nitrite: NEGATIVE
Protein, ur: 100 mg/dL — AB
Specific Gravity, Urine: 1.014 (ref 1.005–1.030)
pH: 5 (ref 5.0–8.0)

## 2022-09-23 LAB — CBC
HCT: 41.9 % (ref 39.0–52.0)
Hemoglobin: 13.8 g/dL (ref 13.0–17.0)
MCH: 29 pg (ref 26.0–34.0)
MCHC: 32.9 g/dL (ref 30.0–36.0)
MCV: 88 fL (ref 80.0–100.0)
Platelets: 195 10*3/uL (ref 150–400)
RBC: 4.76 MIL/uL (ref 4.22–5.81)
RDW: 14.1 % (ref 11.5–15.5)
WBC: 25.1 10*3/uL — ABNORMAL HIGH (ref 4.0–10.5)
nRBC: 0 % (ref 0.0–0.2)

## 2022-09-23 LAB — DIFFERENTIAL
Abs Immature Granulocytes: 0.32 10*3/uL — ABNORMAL HIGH (ref 0.00–0.07)
Basophils Absolute: 0.1 10*3/uL (ref 0.0–0.1)
Basophils Relative: 0 %
Eosinophils Absolute: 0 10*3/uL (ref 0.0–0.5)
Eosinophils Relative: 0 %
Immature Granulocytes: 1 %
Lymphocytes Relative: 5 %
Lymphs Abs: 1.3 10*3/uL (ref 0.7–4.0)
Monocytes Absolute: 1.9 10*3/uL — ABNORMAL HIGH (ref 0.1–1.0)
Monocytes Relative: 8 %
Neutro Abs: 21.4 10*3/uL — ABNORMAL HIGH (ref 1.7–7.7)
Neutrophils Relative %: 86 %

## 2022-09-23 LAB — COMPREHENSIVE METABOLIC PANEL
ALT: 9 U/L (ref 0–44)
AST: 21 U/L (ref 15–41)
Albumin: 2.5 g/dL — ABNORMAL LOW (ref 3.5–5.0)
Alkaline Phosphatase: 76 U/L (ref 38–126)
Anion gap: 12 (ref 5–15)
BUN: 43 mg/dL — ABNORMAL HIGH (ref 8–23)
CO2: 24 mmol/L (ref 22–32)
Calcium: 11.7 mg/dL — ABNORMAL HIGH (ref 8.9–10.3)
Chloride: 104 mmol/L (ref 98–111)
Creatinine, Ser: 2.28 mg/dL — ABNORMAL HIGH (ref 0.61–1.24)
GFR, Estimated: 27 mL/min — ABNORMAL LOW (ref >60)
Glucose, Bld: 116 mg/dL — ABNORMAL HIGH (ref 70–99)
Potassium: 4 mmol/L (ref 3.5–5.1)
Sodium: 140 mmol/L (ref 135–145)
Total Bilirubin: 1.3 mg/dL — ABNORMAL HIGH (ref 0.3–1.2)
Total Protein: 5.6 g/dL — ABNORMAL LOW (ref 6.5–8.1)

## 2022-09-23 LAB — LACTIC ACID, PLASMA: Lactic Acid, Venous: 1.2 mmol/L (ref 0.5–1.9)

## 2022-09-23 MED ORDER — SODIUM CHLORIDE 0.9 % IV SOLN
1.0000 g | Freq: Once | INTRAVENOUS | Status: AC
  Administered 2022-09-23: 1 g via INTRAVENOUS
  Filled 2022-09-23: qty 10

## 2022-09-23 MED ORDER — SODIUM CHLORIDE 0.9 % IV BOLUS (SEPSIS)
1000.0000 mL | Freq: Once | INTRAVENOUS | Status: AC
  Administered 2022-09-23: 1000 mL via INTRAVENOUS

## 2022-09-23 MED ORDER — SODIUM CHLORIDE 0.9 % IV SOLN
1000.0000 mL | INTRAVENOUS | Status: DC
  Administered 2022-09-23: 1000 mL via INTRAVENOUS

## 2022-09-23 MED ORDER — SODIUM CHLORIDE 0.9 % IV SOLN
500.0000 mg | Freq: Once | INTRAVENOUS | Status: AC
  Administered 2022-09-24: 500 mg via INTRAVENOUS
  Filled 2022-09-23: qty 5

## 2022-09-23 NOTE — ED Triage Notes (Signed)
Pt BIBA from home c/o ftt and poor intake. Had a fall in march, declining since. Pt states he feels "Woozy", denies N/V. 18G in FA.  BP 130/82 HR 70   97 RA CBG 123

## 2022-09-23 NOTE — ED Provider Notes (Signed)
[facility] EMERGENCY DEPARTMENT AT [facility] Provider Note   CSN: [id number] Arrival date & time: 09/23/22  1828     History {Add pertinent medical, surgical, social history, OB history to HPI:1} Chief Complaint  Patient presents with   Failure To Thrive    Juan Benjamin is a 89 y.o. male.  HPI   Patient has a history of coronary artery disease hypertension, hyperlipidemia, prostate cancer, pneumonia, aortic stenosis, atrial fibrillation, abdominal aortic aneurysm.  Patient lives by himself at home.  His niece comes to check on him.  She visited him today and was concerned so he was brought to the ED.  Patient states he has had some episodes of diarrhea, 3 episodes per day.  He does not have any abdominal pain.  Not having any fevers.  He has been feeling weaker and more fatigued.  No vomiting.  Patient said decreased p.o. intake with only drinking soda today.  He feels lightheaded when he is standing.  Home Medications Prior to Admission medications   Medication Sig Start Date End Date Taking? Authorizing Provider  acetaminophen (TYLENOL) 325 MG tablet Take 325-650 mg by mouth every 6 (six) hours as needed for moderate pain.    [provider]  allopurinol (ZYLOPRIM) 100 MG tablet Take 100 mg by mouth at bedtime.     [provider]  amLODipine (NORVASC) 10 MG tablet Take 10 mg by mouth daily.    [provider]  aspirin 81 MG tablet Take 1 tablet (81 mg total) by mouth daily. Patient taking differently: Take 81 mg by mouth at bedtime. 02/24/20   Rollene Rotunda, MD  Cholecalciferol (VITAMIN D) 50 MCG (2000 UT) tablet Take 2,000 Units by mouth daily.    [provider]  diphenhydrAMINE (BENADRYL) 25 MG tablet Take 1 tablet by mouth daily as needed (Bee sting).    [provider]  doxycycline (VIBRAMYCIN) 100 MG capsule Take 1 capsule (100 mg total) by mouth 2 (two) times daily. 08/10/22   Curatolo, Adam, DO  EPINEPHrine 0.3  mg/0.3 mL IJ SOAJ injection Inject 0.3 mLs (0.3 mg total) into the muscle as needed for anaphylaxis. 12/16/19   Fayrene Helper, PA-C  levothyroxine (SYNTHROID) 88 MCG tablet Take 75 mcg by mouth daily before breakfast.  75 MG    [provider]  Methylcobalamin 1000 MCG TBDP Take 1,000 mcg by mouth daily.    [provider]  propranolol (INDERAL) 40 MG tablet Take 40 mg by mouth every evening.    [provider]  rosuvastatin (CRESTOR) 10 MG tablet Take 10 mg by mouth daily.    [provider]  zinc oxide (BALMEX) 11.3 % CREA cream Apply 1 application topically daily as needed (pressure spot).    [provider]  ranitidine (ZANTAC) 150 MG tablet Take 1 tablet (150 mg total) by mouth 2 (two) times daily. Patient not taking: Reported on 01/20/2017 11/28/16 12/16/19  Palumbo, April, MD      Allergies    Bee venom, Wasp venom, Crestor [rosuvastatin], Gemfibrozil, Indomethacin, Lipitor [atorvastatin], Lisinopril, Rofecoxib, Sildenafil, and Simvastatin    Review of Systems   Review of Systems  Physical Exam Updated Vital Signs BP 122/71   Pulse 68   Temp 97.6 F (36.4 C) (Oral)   Resp (!) 22   SpO2 93%  Physical Exam Vitals and nursing note reviewed.  Constitutional:      Appearance: He is well-developed. He is not diaphoretic.  HENT:  Head: Normocephalic and atraumatic.     Right Ear: External ear normal.     Left Ear: External ear normal.     Mouth/Throat:     Mouth: Mucous membranes are dry.  Eyes:     General: No scleral icterus.       Right eye: No discharge.        Left eye: No discharge.     Conjunctiva/sclera: Conjunctivae normal.  Neck:     Trachea: No tracheal deviation.  Cardiovascular:     Rate and Rhythm: Normal rate and regular rhythm.  Pulmonary:     Effort: Pulmonary effort is normal. No respiratory distress.     Breath sounds: Normal breath sounds. No stridor. No wheezing or rales.  Abdominal:     General: Bowel sounds  are normal. There is no distension.     Palpations: Abdomen is soft.     Tenderness: There is no abdominal tenderness. There is no guarding or rebound.  Musculoskeletal:        General: No tenderness or deformity.     Cervical back: Neck supple.  Skin:    General: Skin is warm and dry.     Findings: No rash.  Neurological:     General: No focal deficit present.     Mental Status: He is alert.     Cranial Nerves: No cranial nerve deficit, dysarthria or facial asymmetry.     Sensory: No sensory deficit.     Motor: Weakness present. No abnormal muscle tone or seizure activity.     Coordination: Coordination normal.     Comments: Generalized weakness, no focal deficits, equal grip strength bilaterally, patient able lift both legs off the bed  Psychiatric:        Mood and Affect: Mood normal.     ED Results / Procedures / Treatments   Labs (all labs ordered are listed, but only abnormal results are displayed) Labs Reviewed  CBC - Abnormal; Notable for the following components:      Result Value   WBC 25.1 (*)    All other components within normal limits  COMPREHENSIVE METABOLIC PANEL - Abnormal; Notable for the following components:   Glucose, Bld 116 (*)    BUN 43 (*)    Creatinine, Ser 2.28 (*)    Calcium 11.7 (*)    Total Protein 5.6 (*)    Albumin 2.5 (*)    Total Bilirubin 1.3 (*)    GFR, Estimated 27 (*)    All other components within normal limits  URINALYSIS, ROUTINE W REFLEX MICROSCOPIC - Abnormal; Notable for the following components:   Hgb urine dipstick SMALL (*)    Protein, ur 100 (*)    All other components within normal limits  DIFFERENTIAL - Abnormal; Notable for the following components:   Neutro Abs 21.4 (*)    Monocytes Absolute 1.9 (*)    Abs Immature Granulocytes 0.32 (*)    All other components within normal limits  GASTROINTESTINAL PANEL BY PCR, STOOL (REPLACES STOOL CULTURE)  LACTIC ACID, PLASMA    EKG EKG Interpretation  Date/Time:  Monday  September 23 2022 18:43:38 EDT Ventricular Rate:  66 PR Interval:  203 QRS Duration: 107 QT Interval:  392 QTC Calculation: 411 R Axis:   45 Text Interpretation: Sinus rhythm Ventricular premature complex , new since last tracing Anterolateral infarct, age indeterminate Confirmed by Linwood Dibbles [phone]) on 09/23/2022 6:45:06 PM  Radiology No results found.  Procedures Procedures  {Document cardiac monitor, telemetry assessment  procedure when appropriate:1}  Medications Ordered in ED Medications  sodium chloride 0.9 % bolus 1,000 mL (0 mLs Intravenous Stopped 09/23/22 2019)    Followed by  0.9 %  sodium chloride infusion (1,000 mLs Intravenous New Bag/Given 09/23/22 2019)  cefTRIAXone (ROCEPHIN) 1 g in sodium chloride 0.9 % 100 mL IVPB (1 g Intravenous New Bag/Given 09/23/22 2344)  azithromycin (ZITHROMAX) 500 mg in sodium chloride 0.9 % 250 mL IVPB (has no administration in time range)    ED Course/ Medical Decision Making/ A&P Clinical Course as of 09/23/22 2356  Mon Sep 23, 2022  1949 CBC(!) CBC shows leukocytosis with a white count of 25, 1 month ago it was 19 [JK]  1950 Comprehensive metabolic panel(!) Metabolic panel shows increasing creatinine [JK]  2240 X-ray suggest the possibility of multifocal pneumonia [JK]    Clinical Course User Index [JK] Linwood Dibbles, MD   {   Click here for ABCD2, HEART and other calculatorsREFRESH Note before signing :1}                          Medical Decision Making Problems Addressed: AKI (acute kidney injury) (HCC): acute illness or injury that poses a threat to life or bodily functions Community acquired pneumonia, unspecified laterality: acute illness or injury that poses a threat to life or bodily functions Leukocytosis, unspecified type: chronic illness or injury with exacerbation, progression, or side effects of treatment  Amount and/or Complexity of Data Reviewed Labs: ordered. Decision-making details documented in ED Course. Radiology:  ordered and independent interpretation performed.  Risk Prescription drug management.   Patient presented to ER for evaluation of increasing weakness fatigue.  Labs show component of dehydration with acute kidney injury.  Creatinine is elevated compared to previous.  Patient also has significant leukocytosis of 25,000.  This is increased compared to previous.  Urinalysis without definitive signs of infection.  CT abdomen pelvis without acute abnormality chest x-ray however does suggest possibility of pneumonia.  With his leukocytosis dehydration I ordered IV fluids and antibiotics.  I will consult medical service for admission and further treatment  {Document critical care time when appropriate:1} {Document review of labs and clinical decision tools ie heart score, Chads2Vasc2 etc:1}  {Document your independent review of radiology images, and any outside records:1} {Document your discussion with family members, caretakers, and with consultants:1} {Document social determinants of health affecting pt's care:1} {Document your decision making why or why not admission, treatments were needed:1} Final Clinical Impression(s) / ED Diagnoses Final diagnoses:  Community acquired pneumonia, unspecified laterality  Leukocytosis, unspecified type  AKI (acute kidney injury) (HCC)    Rx / DC Orders ED Discharge Orders     None

## 2022-09-23 NOTE — ED Notes (Signed)
Niece Clydie Braun updated.

## 2022-09-23 NOTE — ED Notes (Signed)
Attempted to stand up pt, and is unable to stand safely. Pt also clarified that he is unable to stand safely as well.

## 2022-09-24 ENCOUNTER — Encounter (HOSPITAL_COMMUNITY): Payer: Self-pay | Admitting: Internal Medicine

## 2022-09-24 ENCOUNTER — Observation Stay (HOSPITAL_COMMUNITY): Payer: PPO

## 2022-09-24 ENCOUNTER — Other Ambulatory Visit: Payer: Self-pay

## 2022-09-24 DIAGNOSIS — E861 Hypovolemia: Secondary | ICD-10-CM | POA: Diagnosis present

## 2022-09-24 DIAGNOSIS — J189 Pneumonia, unspecified organism: Secondary | ICD-10-CM | POA: Diagnosis not present

## 2022-09-24 DIAGNOSIS — I350 Nonrheumatic aortic (valve) stenosis: Secondary | ICD-10-CM

## 2022-09-24 DIAGNOSIS — R1310 Dysphagia, unspecified: Secondary | ICD-10-CM | POA: Diagnosis present

## 2022-09-24 DIAGNOSIS — N184 Chronic kidney disease, stage 4 (severe): Secondary | ICD-10-CM

## 2022-09-24 DIAGNOSIS — L8990 Pressure ulcer of unspecified site, unspecified stage: Secondary | ICD-10-CM | POA: Diagnosis present

## 2022-09-24 DIAGNOSIS — E039 Hypothyroidism, unspecified: Secondary | ICD-10-CM | POA: Diagnosis present

## 2022-09-24 DIAGNOSIS — I714 Abdominal aortic aneurysm, without rupture, unspecified: Secondary | ICD-10-CM | POA: Diagnosis present

## 2022-09-24 DIAGNOSIS — D72829 Elevated white blood cell count, unspecified: Secondary | ICD-10-CM | POA: Diagnosis present

## 2022-09-24 DIAGNOSIS — E43 Unspecified severe protein-calorie malnutrition: Secondary | ICD-10-CM | POA: Insufficient documentation

## 2022-09-24 DIAGNOSIS — N179 Acute kidney failure, unspecified: Secondary | ICD-10-CM | POA: Diagnosis present

## 2022-09-24 DIAGNOSIS — L89151 Pressure ulcer of sacral region, stage 1: Secondary | ICD-10-CM

## 2022-09-24 DIAGNOSIS — Z66 Do not resuscitate: Secondary | ICD-10-CM | POA: Diagnosis present

## 2022-09-24 DIAGNOSIS — I2510 Atherosclerotic heart disease of native coronary artery without angina pectoris: Secondary | ICD-10-CM | POA: Diagnosis present

## 2022-09-24 DIAGNOSIS — I480 Paroxysmal atrial fibrillation: Secondary | ICD-10-CM | POA: Diagnosis present

## 2022-09-24 DIAGNOSIS — R451 Restlessness and agitation: Secondary | ICD-10-CM | POA: Diagnosis not present

## 2022-09-24 DIAGNOSIS — H9190 Unspecified hearing loss, unspecified ear: Secondary | ICD-10-CM | POA: Diagnosis present

## 2022-09-24 DIAGNOSIS — J690 Pneumonitis due to inhalation of food and vomit: Secondary | ICD-10-CM | POA: Diagnosis present

## 2022-09-24 DIAGNOSIS — E876 Hypokalemia: Secondary | ICD-10-CM | POA: Diagnosis present

## 2022-09-24 DIAGNOSIS — R54 Age-related physical debility: Secondary | ICD-10-CM | POA: Diagnosis present

## 2022-09-24 DIAGNOSIS — Z79890 Hormone replacement therapy (postmenopausal): Secondary | ICD-10-CM | POA: Diagnosis not present

## 2022-09-24 DIAGNOSIS — I129 Hypertensive chronic kidney disease with stage 1 through stage 4 chronic kidney disease, or unspecified chronic kidney disease: Secondary | ICD-10-CM | POA: Diagnosis present

## 2022-09-24 DIAGNOSIS — B370 Candidal stomatitis: Secondary | ICD-10-CM | POA: Diagnosis present

## 2022-09-24 DIAGNOSIS — R531 Weakness: Secondary | ICD-10-CM | POA: Diagnosis not present

## 2022-09-24 DIAGNOSIS — Z7901 Long term (current) use of anticoagulants: Secondary | ICD-10-CM | POA: Diagnosis not present

## 2022-09-24 DIAGNOSIS — E785 Hyperlipidemia, unspecified: Secondary | ICD-10-CM | POA: Diagnosis present

## 2022-09-24 DIAGNOSIS — I10 Essential (primary) hypertension: Secondary | ICD-10-CM

## 2022-09-24 DIAGNOSIS — R627 Adult failure to thrive: Secondary | ICD-10-CM | POA: Diagnosis present

## 2022-09-24 HISTORY — DX: Chronic kidney disease, stage 4 (severe): N18.4

## 2022-09-24 LAB — ECHOCARDIOGRAM COMPLETE
AR max vel: 0.62 cm2
AV Area VTI: 0.53 cm2
AV Area mean vel: 0.6 cm2
AV Mean grad: 26 mmHg
AV Peak grad: 39.3 mmHg
Ao pk vel: 3.13 m/s
Area-P 1/2: 6.6 cm2
Calc EF: 59.4 %
Est EF: 55
Height: 67 in
P 1/2 time: 587 msec
S' Lateral: 2.8 cm
Single Plane A2C EF: 58.7 %
Single Plane A4C EF: 56.6 %
Weight: 2162.27 oz

## 2022-09-24 LAB — BASIC METABOLIC PANEL
Anion gap: 11 (ref 5–15)
BUN: 40 mg/dL — ABNORMAL HIGH (ref 8–23)
CO2: 24 mmol/L (ref 22–32)
Calcium: 11.3 mg/dL — ABNORMAL HIGH (ref 8.9–10.3)
Chloride: 108 mmol/L (ref 98–111)
Creatinine, Ser: 2.05 mg/dL — ABNORMAL HIGH (ref 0.61–1.24)
GFR, Estimated: 30 mL/min — ABNORMAL LOW (ref >60)
Glucose, Bld: 99 mg/dL (ref 70–99)
Potassium: 3.9 mmol/L (ref 3.5–5.1)
Sodium: 143 mmol/L (ref 135–145)

## 2022-09-24 LAB — TECHNOLOGIST SMEAR REVIEW

## 2022-09-24 LAB — TSH: TSH: 0.976 u[IU]/mL (ref 0.350–4.500)

## 2022-09-24 LAB — VITAMIN D 25 HYDROXY (VIT D DEFICIENCY, FRACTURES): Vit D, 25-Hydroxy: 50.39 ng/mL (ref 30–100)

## 2022-09-24 LAB — PROCALCITONIN: Procalcitonin: 5.52 ng/mL

## 2022-09-24 LAB — CBC
HCT: 43.7 % (ref 39.0–52.0)
Hemoglobin: 14.3 g/dL (ref 13.0–17.0)
MCH: 29 pg (ref 26.0–34.0)
MCHC: 32.7 g/dL (ref 30.0–36.0)
MCV: 88.6 fL (ref 80.0–100.0)
Platelets: 192 10*3/uL (ref 150–400)
RBC: 4.93 MIL/uL (ref 4.22–5.81)
RDW: 14 % (ref 11.5–15.5)
WBC: 23.1 10*3/uL — ABNORMAL HIGH (ref 4.0–10.5)
nRBC: 0 % (ref 0.0–0.2)

## 2022-09-24 MED ORDER — ACETAMINOPHEN 650 MG RE SUPP: 650.0000 mg | Freq: Four times a day (QID) | RECTAL | Status: DC | PRN

## 2022-09-24 MED ORDER — ACETAMINOPHEN 325 MG PO TABS
650.0000 mg | ORAL_TABLET | Freq: Four times a day (QID) | ORAL | Status: DC | PRN
  Administered 2022-09-24 – 2022-09-28 (×5): 650 mg via ORAL
  Filled 2022-09-24 (×5): qty 2

## 2022-09-24 MED ORDER — ENSURE ENLIVE PO LIQD
237.0000 mL | Freq: Two times a day (BID) | ORAL | Status: DC
  Administered 2022-09-24 – 2022-09-30 (×10): 237 mL via ORAL

## 2022-09-24 MED ORDER — AZITHROMYCIN 250 MG PO TABS
500.0000 mg | ORAL_TABLET | ORAL | Status: AC
  Administered 2022-09-24 – 2022-09-27 (×4): 500 mg via ORAL
  Filled 2022-09-24 (×4): qty 2

## 2022-09-24 MED ORDER — ENOXAPARIN SODIUM 40 MG/0.4ML IJ SOSY: 40.0000 mg | PREFILLED_SYRINGE | INTRAMUSCULAR | Status: DC

## 2022-09-24 MED ORDER — ONDANSETRON HCL 4 MG/2ML IJ SOLN: 4.0000 mg | Freq: Four times a day (QID) | INTRAMUSCULAR | Status: DC | PRN

## 2022-09-24 MED ORDER — ONDANSETRON HCL 4 MG PO TABS: 4.0000 mg | ORAL_TABLET | Freq: Four times a day (QID) | ORAL | Status: DC | PRN

## 2022-09-24 MED ORDER — ENOXAPARIN SODIUM 30 MG/0.3ML IJ SOSY
30.0000 mg | PREFILLED_SYRINGE | INTRAMUSCULAR | Status: DC
  Administered 2022-09-24 – 2022-09-29 (×6): 30 mg via SUBCUTANEOUS
  Filled 2022-09-24 (×6): qty 0.3

## 2022-09-24 MED ORDER — SODIUM CHLORIDE 0.45 % IV SOLN: INTRAVENOUS | Status: AC

## 2022-09-24 MED ORDER — SODIUM CHLORIDE 0.9 % IV SOLN: 500.0000 mg | INTRAVENOUS | Status: DC

## 2022-09-24 MED ORDER — SODIUM CHLORIDE 0.9 % IV SOLN
1.0000 g | INTRAVENOUS | Status: DC
  Administered 2022-09-25 (×2): 1 g via INTRAVENOUS
  Filled 2022-09-24 (×2): qty 10

## 2022-09-24 MED ORDER — ADULT MULTIVITAMIN W/MINERALS CH
1.0000 | ORAL_TABLET | Freq: Every day | ORAL | Status: DC
  Administered 2022-09-24 – 2022-09-30 (×7): 1 via ORAL
  Filled 2022-09-24 (×7): qty 1

## 2022-09-24 NOTE — Assessment & Plan Note (Addendum)
Creat 2.2 today, but not significantly changed from March when he was 1.99.  Looks like 1.9 at end of 2022 as well. IVF - 1L bolus in ED, I'm going to hold off on giving much more for the moment, SBP 120s, and dont want to risk putting him into CHF with the aortic stenosis. Repeat BMP in AM

## 2022-09-24 NOTE — Plan of Care (Signed)

## 2022-09-24 NOTE — Progress Notes (Signed)
PHARMACIST - PHYSICIAN COMMUNICATION  CONCERNING: Antibiotic IV to Oral Route Change Policy  RECOMMENDATION: This patient is receiving azithromycin by the intravenous route.  Based on criteria approved by the Pharmacy and Therapeutics Committee, the antibiotic(s) is/are being converted to the equivalent oral dose form(s).   DESCRIPTION: These criteria include: Patient being treated for a respiratory tract infection, urinary tract infection, cellulitis or clostridium difficile associated diarrhea if on metronidazole The patient is not neutropenic and does not exhibit a GI malabsorption state The patient is eating (either orally or via tube) and/or has been taking other orally administered medications for a least 24 hours The patient is improving clinically and has a Tmax < 100.5  If you have questions about this conversion, please contact the Pharmacy Department   Pricilla Riffle, PharmD, BCPS Clinical Pharmacist 09/24/2022 11:25 AM

## 2022-09-24 NOTE — Assessment & Plan Note (Addendum)
No obvious widespread malignancy on CT AP today, nor CT chest in March. Check PTH PTH rp Vit D level

## 2022-09-24 NOTE — Assessment & Plan Note (Signed)
4.5cm up from 4.3 previously.  Needs f/u CT q6 months per recs. Given thin body habitus and how superficial the aorta is, I'm a bit concerned he might be at somewhat increased risk of rupture. But given CKD 4 and age, I'm not so sure hed be a great surgical candidate. Might be a better idea for pal care to discuss GOC with him and family first. But would prefer to complete work up regarding generalized weakness, AS, and hypercalcemia first. Ill hold off on vasc surg consult.

## 2022-09-24 NOTE — Progress Notes (Signed)
Echocardiogram 2D Echocardiogram has been performed.  Toni Amend 09/24/2022, 10:13 AM

## 2022-09-24 NOTE — Assessment & Plan Note (Signed)
Cont home BP meds when med rec completed. ?

## 2022-09-24 NOTE — Evaluation (Signed)
Physical Therapy Evaluation Patient Details Name: Juan Benjamin MRN: [id number] DOB: 12/01/1930 Today's Date: 09/24/2022  History of Present Illness  Juan Benjamin is a 89 y.o. male admitted 09/23/22 with generalized weakness.  Niece visited patient, was concerned due to generalized weakness and debility so pt brought in to ED. Pt reported that he had some episodes of diarrhea, 3 episodes per day, no abdominal pain, feeling weaker and more fatigued but no vomiting. Pt reported decreased p.o. intake and that he feels lightheaded when he is standing. Pt with medical history including but not limited to  AAA, AS, HTN, hypothyroidism  Clinical Impression  Pt admitted with above diagnosis. At baseline, exact PLOF is unclear as pt poor historian.  Per chart review and information from RN and OT pt lives alone with niece checking on him.  Reports that he is mostly a household ambulator and rarely gets out.  Today, pt very confused but pleasant and able to be directed.  He was able to transfer with min A to steady and ambulated 80' with RW and min A to steady.  Pt requiring mod cues for all task due to confusion and for safety.  Also, noted that niece would like for pt to have long term care at some point per OT note.  Pt currently with functional limitations due to the deficits listed below (see PT Problem List). Pt will benefit from acute skilled PT to increase their independence and safety with mobility to allow discharge.          Recommendations for follow up therapy are one component of a multi-disciplinary discharge planning process, led by the attending physician.  Recommendations may be updated based on patient status, additional functional criteria and insurance authorization.  Follow Up Recommendations Can patient physically be transported by private vehicle: Yes     Assistance Recommended at Discharge Frequent or constant Supervision/Assistance  Patient can return home with the following   A little help with walking and/or transfers;A little help with bathing/dressing/bathroom;Assistance with cooking/housework;Help with stairs or ramp for entrance    Equipment Recommendations Rolling walker (2 wheels)  Recommendations for Other Services       Functional Status Assessment Patient has had a recent decline in their functional status and demonstrates the ability to make significant improvements in function in a reasonable and predictable amount of time.     Precautions / Restrictions Precautions Precautions: Fall Restrictions Weight Bearing Restrictions: No      Mobility  Bed Mobility Overal bed mobility: Needs Assistance Bed Mobility: Supine to Sit, Sit to Supine     Supine to sit: Min guard Sit to supine: Min guard        Transfers Overall transfer level: Needs assistance   Transfers: Sit to/from Stand Sit to Stand: Min assist           General transfer comment: Light min A to intiate and steady    Ambulation/Gait Ambulation/Gait assistance: Min assist Gait Distance (Feet): 80 Feet Assistive device: Rolling walker (2 wheels) Gait Pattern/deviations: Step-to pattern, Decreased stride length Gait velocity: decreased     General Gait Details: Cues for RW proximity and min A to guide RW  Stairs            Wheelchair Mobility    Modified Rankin (Stroke Patients Only)       Balance Overall balance assessment: Needs assistance Sitting-balance support: No upper extremity supported, Feet supported Sitting balance-Leahy Scale: Fair     Standing balance support: Bilateral  upper extremity supported, No upper extremity supported, During functional activity Standing balance-Leahy Scale: Fair Standing balance comment: RW to ambulate but could static stand without support                             Pertinent Vitals/Pain Pain Assessment Pain Assessment: No/denies pain    Home Living Family/patient expects to be discharged to::  Private residence Living Arrangements: Other relatives;Other (Comment) (niece checks on him) Available Help at Discharge: Family;Friend(s);Available PRN/intermittently Type of Home: House Home Access: Stairs to enter Entrance Stairs-Rails: Can reach both Entrance Stairs-Number of Steps: 4   Home Layout: One level Home Equipment: None Additional Comments: pt is a poor historian, some info obtained from previous chart and RN    Prior Function               Mobility Comments: Pt reports that he used no AD for mobility ADLs Comments: Pt reports that he was Ind with ADLs/selfcare     Hand Dominance   Dominant Hand: Right    Extremity/Trunk Assessment   Upper Extremity Assessment Upper Extremity Assessment: Overall WFL for tasks assessed;Difficult to assess due to impaired cognition    Lower Extremity Assessment Lower Extremity Assessment: Overall WFL for tasks assessed;Difficult to assess due to impaired cognition (no focal deficits noted; at least 3/5 throughout but pt not following further commands)    Cervical / Trunk Assessment Cervical / Trunk Assessment: Kyphotic  Communication   Communication: HOH  Cognition Arousal/Alertness: Awake/alert Behavior During Therapy: WFL for tasks assessed/performed Overall Cognitive Status: No family/caregiver present to determine baseline cognitive functioning Area of Impairment: Orientation, Memory, Safety/judgement, Awareness, Following commands, Problem solving                 Orientation Level: Place, Situation, Time   Memory: Decreased short-term memory Following Commands: Follows one step commands with increased time Safety/Judgement: Decreased awareness of safety, Decreased awareness of deficits Awareness: Intellectual Problem Solving: Requires tactile cues, Requires verbal cues General Comments: Pt reporting lives with wife and is independent wtih ADLs , IADLs, shopping , mowing, etc.  However, RN reports niece  stated pt lives alone (wife deceased), doesn't leave house, and eats very little.  Pt not aware he is in hospital and keeps asking to go back to his room .  Attempted to reorient but no recall        General Comments General comments (skin integrity, edema, etc.): VSS    Exercises     Assessment/Plan    PT Assessment Patient needs continued PT services  PT Problem List Decreased strength;Decreased range of motion;Decreased activity tolerance;Decreased balance;Decreased mobility;Decreased cognition;Decreased knowledge of use of DME;Decreased safety awareness;Decreased knowledge of precautions       PT Treatment Interventions DME instruction;Gait training;Therapeutic exercise;Functional mobility training;Therapeutic activities;Patient/family education;Cognitive remediation;Stair training;Neuromuscular re-education;Balance training    PT Goals (Current goals can be found in the Care Plan section)  Acute Rehab PT Goals Patient Stated Goal: "go back to my room" (pt in room) PT Goal Formulation: Patient unable to participate in goal setting Time For Goal Achievement: 10/08/22 Potential to Achieve Goals: Fair    Frequency Min 1X/week     Co-evaluation               AM-PAC PT "6 Clicks" Mobility  Outcome Measure Help needed turning from your back to your side while in a flat bed without using bedrails?: A Little Help needed moving from lying on  your back to sitting on the side of a flat bed without using bedrails?: A Little Help needed moving to and from a bed to a chair (including a wheelchair)?: A Lot (All below min A but mod cues) Help needed standing up from a chair using your arms (e.g., wheelchair or bedside chair)?: A Lot Help needed to walk in hospital room?: A Lot Help needed climbing 3-5 steps with a railing? : A Lot 6 Click Score: 14    End of Session Equipment Utilized During Treatment: Gait belt Activity Tolerance: Patient tolerated treatment well Patient left:  in bed;with call bell/phone within reach;with bed alarm set Nurse Communication: Mobility status PT Visit Diagnosis: Other abnormalities of gait and mobility (R26.89);Muscle weakness (generalized) (M62.81)    Time: 2956-2130 PT Time Calculation (min) (ACUTE ONLY): 13 min   Charges:              Anise Salvo, PT Acute Rehab [facility] [phone]   Rayetta Humphrey 09/24/2022, 3:48 PM

## 2022-09-24 NOTE — Progress Notes (Signed)
Initial Nutrition Assessment  DOCUMENTATION CODES:   Severe malnutrition in context of chronic illness  INTERVENTION:   -Ensure Plus High Protein po BID, each supplement provides 350 kcal and 20 grams of protein.   -Multivitamin with minerals daily  NUTRITION DIAGNOSIS:   Severe Malnutrition related to chronic illness as evidenced by severe fat depletion, severe muscle depletion.  GOAL:   Patient will meet greater than or equal to 90% of their needs  MONITOR:   PO intake, Supplement acceptance, Labs, Weight trends, I & O's  REASON FOR ASSESSMENT:   Consult Assessment of nutrition requirement/status  ASSESSMENT:   89 y.o. male with medical history significant of AAA, AS, HTN, hypothyroidism, PAF not on AC it doesn't look like.  Patient in room, no family at bedside. Pt very HOH. Was not able to get a lot of history from patient. He had breakfast tray, had eaten a few bites of eggs. [place] Spaniel he is just resting now. Encouraged pt to try and eat a little more. Per chart review, pt was having diarrhea, on enteric precautions.  Pt agreeable to receiving Ensure supplements, will add daily MVI as well.  Per weight records, pt's weight has been trending down since 2021. No significant weight changes.  Medications reviewed.  Labs reviewed.  NUTRITION - FOCUSED PHYSICAL EXAM:  Flowsheet Row Most Recent Value  Orbital Region Severe depletion  Upper Arm Region Severe depletion  Thoracic and Lumbar Region Severe depletion  Buccal Region Severe depletion  Temple Region Severe depletion  Clavicle Bone Region Moderate depletion  Clavicle and Acromion Bone Region Moderate depletion  Scapular Bone Region Moderate depletion  Dorsal Hand Moderate depletion  Patellar Region Severe depletion  Anterior Thigh Region Severe depletion  Posterior Calf Region Severe depletion  Edema (RD Assessment) None  Hair Reviewed  Eyes Reviewed  Mouth Reviewed  [edentulous]  Skin Reviewed  Nails  Reviewed       Diet Order:   Diet Order             DIET SOFT Room service appropriate? Yes; Fluid consistency: Thin  Diet effective now                   EDUCATION NEEDS:   Not appropriate for education at this time  Skin:  Skin Assessment: Skin Integrity Issues: Skin Integrity Issues:: Stage I Stage I: buttocks  Last BM:  PTA  Height:   Ht Readings from Last 1 Encounters:  09/24/22 5\' 7"  (1.702 m)    Weight:   Wt Readings from Last 1 Encounters:  09/24/22 61.3 kg    BMI:  Body mass index is 21.17 kg/m.  Estimated Nutritional Needs:   Kcal:  1850-2050  Protein:  75-90g  Fluid:  2L/day  Tilda Franco, MS, RD, LDN Inpatient Clinical Dietitian Contact information available via Amion

## 2022-09-24 NOTE — ED Notes (Signed)
Niece, Clydie Braun, updated.

## 2022-09-24 NOTE — ED Notes (Signed)
ED TO INPATIENT HANDOFF REPORT  Name/Age/Gender Juan Benjamin 89 y.o. male  Code Status Code Status History     Date Active Date Inactive Code Status Order ID Comments User Context   09/25/2020 0314 10/04/2020 1802 Full Code [id number]  Hillary Bow, DO ED       Home/SNF/Other Home  Chief Complaint Generalized weakness [R53.1]  Level of Care/Admitting Diagnosis ED Disposition     ED Disposition  Admit   Condition  --   Comment  Hospital Area: [facility] [[id number]]  Level of Care: Med-Surg [16]  May place patient in observation at [facility] or [facility] if equivalent level of care is available:: No  Covid Evaluation: Asymptomatic - no recent exposure (last 10 days) testing not required  Diagnosis: Generalized weakness [409811]  Admitting Physician: Hillary Bow [[id number]  Attending Physician: Hillary Bow [id number]          Medical History Past Medical History:  Diagnosis Date   AAA (abdominal aortic aneurysm) (HCC)    Aortic stenosis    Carotid artery occlusion    Chronic occlusion of left, R CEA   Coronary artery disease    GERD (gastroesophageal reflux disease)    Hyperlipidemia    Hypertension    Hypothyroidism    PAF (paroxysmal atrial fibrillation) (HCC)    Pneumonia    Prostate cancer (HCC)     Allergies Allergies  Allergen Reactions   Bee Venom Anaphylaxis   Wasp Venom Anaphylaxis   Crestor [Rosuvastatin] Other (See Comments)    myalgia   Gemfibrozil Other (See Comments)    unknown   Indomethacin Other (See Comments)    unknown   Lipitor [Atorvastatin]     myalgia   Lisinopril Cough   Rofecoxib Other (See Comments)    unknown   Sildenafil Other (See Comments)    unknown   Simvastatin Other (See Comments)    unknown    IV Location/Drains/Wounds Patient Lines/Drains/Airways Status     Active Line/Drains/Airways     Name Placement date Placement time Site Days   Peripheral IV 09/23/22 18 G  Anterior;Left Forearm 09/23/22  1911  Forearm  1   Closed System Drain Right;Anterior RUQ Bulb (JP) 19 Fr. 09/26/20  0957  RUQ  728   GI Stent 4 Fr. 09/29/20  1718  --  725   GI Stent  12/15/20  1422  --  648   Airway 7.5 mm 03/12/21  1218  -- 561   Incision - 4 Ports Abdomen Upper;Right Upper;Right Upper;Right Mid;Left 09/26/20  0936  -- 728            Labs/Imaging Results for orders placed or performed during the hospital encounter of 09/23/22 (from the past 48 hour(s))  CBC     Status: Abnormal   Collection Time: 09/23/22  6:54 PM  Result Value Ref Range   WBC 25.1 (H) 4.0 - 10.5 K/uL   RBC 4.76 4.22 - 5.81 MIL/uL   Hemoglobin 13.8 13.0 - 17.0 g/dL   HCT 82.9 56.2 - 13.0 %   MCV 88.0 80.0 - 100.0 fL   MCH 29.0 26.0 - 34.0 pg   MCHC 32.9 30.0 - 36.0 g/dL   RDW 86.5 78.4 - 69.6 %   Platelets 195 150 - 400 K/uL   nRBC 0.0 0.0 - 0.2 %    Comment: Performed at [facility], [address], [place], [place] [address]  Comprehensive metabolic panel  Status: Abnormal   Collection Time: 09/23/22  6:54 PM  Result Value Ref Range   Sodium 140 135 - 145 mmol/L   Potassium 4.0 3.5 - 5.1 mmol/L   Chloride 104 98 - 111 mmol/L   CO2 24 22 - 32 mmol/L   Glucose, Bld 116 (H) 70 - 99 mg/dL    Comment: Glucose reference range applies only to samples taken after fasting for at least 8 hours.   BUN 43 (H) 8 - 23 mg/dL   Creatinine, Ser 9.60 (H) 0.61 - 1.24 mg/dL   Calcium 45.4 (H) 8.9 - 10.3 mg/dL   Total Protein 5.6 (L) 6.5 - 8.1 g/dL   Albumin 2.5 (L) 3.5 - 5.0 g/dL   AST 21 15 - 41 U/L   ALT 9 0 - 44 U/L   Alkaline Phosphatase 76 38 - 126 U/L   Total Bilirubin 1.3 (H) 0.3 - 1.2 mg/dL   GFR, Estimated 27 (L) >60 mL/min    Comment: (NOTE) Calculated using the CKD-EPI Creatinine Equation (2021)    Anion gap 12 5 - 15    Comment: Performed at [facility], [address], [place], [place] [address]  Differential     Status: Abnormal    Collection Time: 09/23/22  6:54 PM  Result Value Ref Range   Neutrophils Relative % 86 %   Neutro Abs 21.4 (H) 1.7 - 7.7 K/uL   Lymphocytes Relative 5 %   Lymphs Abs 1.3 0.7 - 4.0 K/uL   Monocytes Relative 8 %   Monocytes Absolute 1.9 (H) 0.1 - 1.0 K/uL   Eosinophils Relative 0 %   Eosinophils Absolute 0.0 0.0 - 0.5 K/uL   Basophils Relative 0 %   Basophils Absolute 0.1 0.0 - 0.1 K/uL   Immature Granulocytes 1 %   Abs Immature Granulocytes 0.32 (H) 0.00 - 0.07 K/uL    Comment: Performed at [facility], [address], [place], [place] [address]  Urinalysis, Routine w reflex microscopic -Urine, Clean Catch     Status: Abnormal   Collection Time: 09/23/22  7:08 PM  Result Value Ref Range   Color, Urine YELLOW YELLOW   APPearance CLEAR CLEAR   Specific Gravity, Urine 1.014 1.005 - 1.030   pH 5.0 5.0 - 8.0   Glucose, UA NEGATIVE NEGATIVE mg/dL   Hgb urine dipstick SMALL (A) NEGATIVE   Bilirubin Urine NEGATIVE NEGATIVE   Ketones, ur NEGATIVE NEGATIVE mg/dL   Protein, ur 295 (A) NEGATIVE mg/dL   Nitrite NEGATIVE NEGATIVE   Leukocytes,Ua NEGATIVE NEGATIVE   RBC / HPF 0-5 0 - 5 RBC/hpf   WBC, UA 0-5 0 - 5 WBC/hpf   Bacteria, UA NONE SEEN NONE SEEN   Squamous Epithelial / HPF 0-5 0 - 5 /HPF   Mucus PRESENT     Comment: Performed at [facility], [address] [place], [place] [address]  Lactic acid, plasma     Status: None   Collection Time: 09/23/22  8:14 PM  Result Value Ref Range   Lactic Acid, Venous 1.2 0.5 - 1.9 mmol/L    Comment: Performed at [facility], [address] [place], [place] [address]   No results found.  Pending Labs Unresulted Labs (From admission, onward)     Start     Ordered   09/24/22 0020  Parathyroid hormone, intact (no Ca)  Once,   URGENT        09/24/22 0019   09/24/22 0020  PTH-related peptide  Once,   URGENT        09/24/22 0019   09/24/22 0020  VITAMIN D 25 Hydroxy (Vit-D  Deficiency, Fractures)  Once,   URGENT        09/24/22 0019   09/24/22 0019  Technologist smear review  (Technologist smear review)  Add-on,   AD       Question:  Clinical information:  Answer:  are those actually monocytes in blood, or blasts?   09/24/22 0018   09/23/22 1909  Gastrointestinal Panel by PCR , Stool  (Gastrointestinal Panel by PCR, Stool                                                                                                                                                     **Does Not include CLOSTRIDIUM DIFFICILE testing. **If CDIFF testing is needed, place order from the "C Difficile Testing" order set.**)  Once,   URGENT        09/23/22 1909            Vitals/Pain Today's Vitals   09/23/22 2015 09/23/22 2045 09/23/22 2301 09/23/22 2345  BP: 126/65 122/71  (!) 119/106  Pulse: 65 68  73  Resp: 16 (!) 22  16  Temp:   97.6 F (36.4 C)   TempSrc:   Oral   SpO2: 95% 93%  97%    Isolation Precautions Enteric precautions (UV disinfection)  Medications Medications  sodium chloride 0.9 % bolus 1,000 mL (0 mLs Intravenous Stopped 09/23/22 2019)    Followed by  0.9 %  sodium chloride infusion (1,000 mLs Intravenous New Bag/Given 09/23/22 2019)  azithromycin (ZITHROMAX) 500 mg in sodium chloride 0.9 % 250 mL IVPB (has no administration in time range)  cefTRIAXone (ROCEPHIN) 1 g in sodium chloride 0.9 % 100 mL IVPB (1 g Intravenous New Bag/Given 09/23/22 2344)    Mobility non-ambulatory

## 2022-09-24 NOTE — H&P (Addendum)
History and Physical    Patient: Juan Benjamin DOB: 22-Jul-1930 DOA: 09/23/2022 DOS: the patient was seen and examined on 09/24/2022 PCP: Richmond Campbell., PA-C  Patient coming from: Home  Chief Complaint:  Chief Complaint  Patient presents with   Failure To Thrive   HPI: Juan Benjamin is a 89 y.o. male with medical history significant of AAA, AS, HTN, hypothyroidism, PAF not on AC it doesn't look like.  Pt lives by himself at home.  Niece comes to check on him.  She visited patient today, was concerned due to generalized weakness and debility so pt brought in to ED.  Patient states he has had some episodes of diarrhea, 3 episodes per day. He does not have any abdominal pain. Not having any fevers. He has been feeling weaker and more fatigued. No vomiting. Patient said decreased p.o. intake with only drinking soda today. He feels lightheaded when he is standing.   Treated for PNA with ABx as outpt last month.   Review of Systems: As mentioned in the history of present illness. All other systems reviewed and are negative. Past Medical History:  Diagnosis Date   AAA (abdominal aortic aneurysm) (HCC)    Aortic stenosis    Carotid artery occlusion    Chronic occlusion of left, R CEA   Coronary artery disease    GERD (gastroesophageal reflux disease)    Hyperlipidemia    Hypertension    Hypothyroidism    PAF (paroxysmal atrial fibrillation) (HCC)    Pneumonia    Prostate cancer [facility])    Past Surgical History:  Procedure Laterality Date   BILIARY STENT PLACEMENT  09/29/2020   Procedure: BILIARY STENT PLACEMENT;  Surgeon: Lemar Lofty., MD;  Location: [facility] ENDOSCOPY;  Service: Gastroenterology;;   BILIARY STENT PLACEMENT N/A 12/15/2020   Procedure: BILIARY STENT PLACEMENT;  Surgeon: Lemar Lofty., MD;  Location: [facility] ENDOSCOPY;  Service: Gastroenterology;  Laterality: N/A;   BIOPSY  09/29/2020   Procedure: BIOPSY;  Surgeon: Meridee Score Netty Starring.,  MD;  Location: [facility] ENDOSCOPY;  Service: Gastroenterology;;   CAROTID ARTERY SURGERY     Right    CHOLECYSTECTOMY N/A 09/26/2020   Procedure: LAPAROSCOPIC SUBTOTAL CHOLECYSTECTOMY;  Surgeon: Diamantina Monks, MD;  Location: [facility] OR;  Service: General;  Laterality: N/A;   CORONARY ARTERY BYPASS GRAFT     CABG (4 vessel, 2000.  No report in [place])   ENDOSCOPIC RETROGRADE CHOLANGIOPANCREATOGRAPHY (ERCP) WITH PROPOFOL N/A 09/29/2020   Procedure: ENDOSCOPIC RETROGRADE CHOLANGIOPANCREATOGRAPHY (ERCP) WITH PROPOFOL;  Surgeon: Meridee Score Netty Starring., MD;  Location: [facility] ENDOSCOPY;  Service: Gastroenterology;  Laterality: N/A;   ENDOSCOPIC RETROGRADE CHOLANGIOPANCREATOGRAPHY (ERCP) WITH PROPOFOL N/A 12/15/2020   Procedure: ENDOSCOPIC RETROGRADE CHOLANGIOPANCREATOGRAPHY (ERCP) WITH PROPOFOL;  Surgeon: Meridee Score Netty Starring., MD;  Location: [facility] ENDOSCOPY;  Service: Gastroenterology;  Laterality: N/A;   ENDOSCOPIC RETROGRADE CHOLANGIOPANCREATOGRAPHY (ERCP) WITH PROPOFOL N/A 03/12/2021   Procedure: ENDOSCOPIC RETROGRADE CHOLANGIOPANCREATOGRAPHY (ERCP) WITH PROPOFOL;  Surgeon: Meridee Score Netty Starring., MD;  Location: [facility] ENDOSCOPY;  Service: Gastroenterology;  Laterality: N/A;   HEMOSTASIS CLIP PLACEMENT  12/15/2020   Procedure: HEMOSTASIS CLIP PLACEMENT;  Surgeon: Lemar Lofty., MD;  Location: [facility] ENDOSCOPY;  Service: Gastroenterology;;   MELANOMA EXCISION WITH SENTINEL LYMPH NODE BIOPSY Left 03/21/2020   Procedure: WIDE LOCAL EXCISION LEFT UPPER ARM MELANOMA ADVANCEMENT FLAP CLOSURE FOR DEFECT 4X8CM SENTINEL LYMPH NODE MAPPING AND BIOPSY;  Surgeon: Almond Lint, MD;  Location: [facility] OR;  Service: General;  Laterality: Left;   PANCREATIC STENT PLACEMENT  09/29/2020  Procedure: PANCREATIC STENT PLACEMENT;  Surgeon: Meridee Score Netty Starring., MD;  Location: [facility] ENDOSCOPY;  Service: Gastroenterology;;   REMOVAL OF STONES  09/29/2020   Procedure: REMOVAL OF STONES;  Surgeon: Lemar Lofty., MD;  Location: [facility]  ENDOSCOPY;  Service: Gastroenterology;;   REMOVAL OF STONES  12/15/2020   Procedure: REMOVAL OF STONES;  Surgeon: Lemar Lofty., MD;  Location: [facility] ENDOSCOPY;  Service: Gastroenterology;;   REMOVAL OF STONES  03/12/2021   Procedure: REMOVAL OF SLUDGE;  Surgeon: Lemar Lofty., MD;  Location: [facility] ENDOSCOPY;  Service: Gastroenterology;;   RENAL ARTERY STENT  07/02/1999   RIGHT SIDE   SPHINCTEROTOMY  09/29/2020   Procedure: SPHINCTEROTOMY;  Surgeon: Mansouraty, Netty Starring., MD;  Location: [facility] ENDOSCOPY;  Service: Gastroenterology;;   Francine Graven REMOVAL  12/15/2020   Procedure: STENT REMOVAL;  Surgeon: Lemar Lofty., MD;  Location: [facility] ENDOSCOPY;  Service: Gastroenterology;;   Francine Graven REMOVAL  03/12/2021   Procedure: STENT REMOVAL;  Surgeon: Lemar Lofty., MD;  Location: [facility] ENDOSCOPY;  Service: Gastroenterology;;   Social History:  reports that he quit smoking about 41 years ago. His smoking use included cigarettes. He has never used smokeless tobacco. He reports that he does not drink alcohol and does not use drugs.  Allergies  Allergen Reactions   Bee Venom Anaphylaxis   Wasp Venom Anaphylaxis   Crestor [Rosuvastatin] Other (See Comments)    myalgia   Gemfibrozil Other (See Comments)    unknown   Indomethacin Other (See Comments)    unknown   Lipitor [Atorvastatin]     myalgia   Lisinopril Cough   Rofecoxib Other (See Comments)    unknown   Sildenafil Other (See Comments)    unknown   Simvastatin Other (See Comments)    unknown    Family History  Problem Relation Age of Onset   Bone cancer Mother    Colon cancer Neg Hx    Esophageal cancer Neg Hx    Inflammatory bowel disease Neg Hx    Liver disease Neg Hx    Pancreatic cancer Neg Hx    Rectal cancer Neg Hx    Stomach cancer Neg Hx     Prior to Admission medications   Medication Sig Start Date End Date Taking? Authorizing Provider  acetaminophen (TYLENOL) 325 MG tablet Take 325-650 mg by mouth  every 6 (six) hours as needed for moderate pain.    [provider]  allopurinol (ZYLOPRIM) 100 MG tablet Take 100 mg by mouth at bedtime.     [provider]  amLODipine (NORVASC) 10 MG tablet Take 10 mg by mouth daily.    [provider]  aspirin 81 MG tablet Take 1 tablet (81 mg total) by mouth daily. Patient taking differently: Take 81 mg by mouth at bedtime. 02/24/20   Rollene Rotunda, MD  Cholecalciferol (VITAMIN D) 50 MCG (2000 UT) tablet Take 2,000 Units by mouth daily.    [provider]  diphenhydrAMINE (BENADRYL) 25 MG tablet Take 1 tablet by mouth daily as needed (Bee sting).    [provider]  doxycycline (VIBRAMYCIN) 100 MG capsule Take 1 capsule (100 mg total) by mouth 2 (two) times daily. 08/10/22   Curatolo, Adam, DO  EPINEPHrine 0.3 mg/0.3 mL IJ SOAJ injection Inject 0.3 mLs (0.3 mg total) into the muscle as needed for anaphylaxis. 12/16/19   Fayrene Helper, PA-C  levothyroxine (SYNTHROID) 88 MCG tablet Take 75 mcg by mouth daily before breakfast.  75 MG    [provider]  Methylcobalamin 1000 MCG TBDP Take 1,000 mcg by mouth daily.    [provider]  propranolol (INDERAL) 40 MG tablet Take 40 mg by mouth every evening.    [provider]  rosuvastatin (CRESTOR) 10 MG tablet Take 10 mg by mouth daily.    [provider]  zinc oxide (BALMEX) 11.3 % CREA cream Apply 1 application topically daily as needed (pressure spot).    [provider]  ranitidine (ZANTAC) 150 MG tablet Take 1 tablet (150 mg total) by mouth 2 (two) times daily. Patient not taking: Reported on 01/20/2017 11/28/16 12/16/19  Nicanor Alcon, April, MD    Physical Exam: Vitals:   09/23/22 2301 09/23/22 2345 09/24/22 0100 09/24/22 0130  BP:  (!) 119/106  124/68  Pulse:  73  61  Resp:  16  20  Temp: 97.6 F (36.4 C)   (!) 97.3 F (36.3 C)  TempSrc: Oral     SpO2:  97%  93%  Weight:   61.3 kg   Height:   5\' 7"  (1.702 m)     Constitutional: NAD, calm, comfortable, thin and frail, very hard of hearing Respiratory: clear to auscultation bilaterally, no wheezing, no crackles. Normal respiratory effort. No accessory muscle use.  Cardiovascular: Soft murmur present despite thin body habitus.  Much quieter than I would expect for his known aortic stenosis Abdomen: Scaphoid, pulsating mass is palpable to light touch in midline of lower abdomen, I didn't really palpate or push on abdomen given how shallow the AAA is. Skin: Stage 1 sacral decubitus Neurologic: CN 2-12 grossly intact. Sensation intact, DTR normal. Strength 5/5 in all 4.  Psychiatric: Normal judgment and insight. Alert and oriented x 3. Normal mood.   Data Reviewed:       Latest Ref Rng & Units 09/23/2022    6:54 PM 08/10/2022    4:30 PM 03/12/2021   11:09 AM  CBC  WBC 4.0 - 10.5 K/uL 25.1  19.2    Hemoglobin 13.0 - 17.0 g/dL 16.1  09.6  04.5   Hematocrit 39.0 - 52.0 % 41.9  42.6  42.0   Platelets 150 - 400 K/uL 195  213        Latest Ref Rng & Units 09/23/2022    6:54 PM 08/10/2022    4:30 PM 03/12/2021   11:09 AM  CMP  Glucose 70 - 99 mg/dL 409  811  92   BUN 8 - 23 mg/dL 43  43  31   Creatinine 0.61 - 1.24 mg/dL 9.14  7.82  9.56   Sodium 135 - 145 mmol/L 140  144  141   Potassium 3.5 - 5.1 mmol/L 4.0  3.7  4.5   Chloride 98 - 111 mmol/L 104  111  106   CO2 22 - 32 mmol/L 24  23    Calcium 8.9 - 10.3 mg/dL 21.3  08.6    Total Protein 6.5 - 8.1 g/dL 5.6     Total Bilirubin 0.3 - 1.2 mg/dL 1.3     Alkaline Phos 38 - 126 U/L 76     AST 15 - 41 U/L 21     ALT 0 - 44 U/L 9      CT AP: No acute findings.  4.5cm AAA was 4.3cm previously.  CXR = diffuse peripheral opacities, ? PNA  Assessment and Plan: * Generalized weakness Pt with a number of things that could be causing generalized weakness that need further evaluation and work up: Aortic stenosis  Hypercalcemia Leukocytosis Thin body habitus / ? Malnutrition Plan Work up for first  3 issues as described below Dietary consult PT/OT TSH  Leukocytosis Present last month as well. ? PNA on CXR today. However, no fever, chills, productive cough, etc. Got rocephin / azithro in ED Check procalcitonin Check blood smear to make sure nothing that looks neoplastic Watch for signs / symptoms of PNA Holding off on further ABx for the moment pending procalcitonin  Hypercalcemia No obvious widespread malignancy on CT AP today, nor CT chest in March. Check PTH PTH rp Vit D level  Aortic stenosis Despite very thin body habitus, AS murmur, while present, sounds very muffled. AS was mod-severe in July 2023. ? If severe to critical AS is causing his generalized weakness symptoms now? 2d echo ordered to investigate further.  CKD (chronic kidney disease) stage 4, GFR 15-29 ml/min (HCC) Creat 2.2 today, but not significantly changed from March when he was 1.99.  Looks like 1.9 at end of 2022 as well. IVF - 1L bolus in ED, I'm going to hold off on giving much more for the moment, SBP 120s, and dont want to risk putting him into CHF with the aortic stenosis. Repeat BMP in AM  AAA (abdominal aortic aneurysm) (HCC) 4.5cm up from 4.3 previously.  Needs f/u CT q6 months per recs. Given thin body habitus and how superficial the aorta is, I'm a bit concerned he might be at somewhat increased risk of rupture. But given CKD 4 and age, I'm not so sure hed be a great surgical candidate. Might be a better idea for pal care to discuss GOC with him and family first. But would prefer to complete work up regarding generalized weakness, AS, and hypercalcemia first. Ill hold off on vasc surg consult.  Pressure injury of skin Stage 1, sacrum. Local wound care  Hypertension Cont home BP meds when med rec completed.      Advance Care Planning:   Code Status: Full Code (Default status for now)  Consults: None  Family Communication: No family in room  Severity of Illness: The  appropriate patient status for this patient is OBSERVATION. Observation status is judged to be reasonable and necessary in order to provide the required intensity of service to ensure the patient's safety. The patient's presenting symptoms, physical exam findings, and initial radiographic and laboratory data in the context of their medical condition is felt to place them at decreased risk for further clinical deterioration. Furthermore, it is anticipated that the patient will be medically stable for discharge from the hospital within 2 midnights of admission.   Author: Hillary Bow., DO 09/24/2022 2:48 AM  For on call review [email or web address].

## 2022-09-24 NOTE — Progress Notes (Signed)
Patient is transferred from ED to the unit at 0120.Alert and oriented x 2. No pain complained. Vital signs was taken and call light was within patient's reach.

## 2022-09-24 NOTE — Assessment & Plan Note (Addendum)
Pt with a number of things that could be causing generalized weakness that need further evaluation and work up: Aortic stenosis Hypercalcemia Leukocytosis Thin body habitus / ? Malnutrition Plan Work up for first 3 issues as described below Dietary consult PT/OT TSH

## 2022-09-24 NOTE — Evaluation (Signed)
Occupational Therapy Evaluation Patient Details Name: Juan Benjamin MRN: [id number] DOB: 05/29/1930 Today's Date: 09/24/2022   History of Present Illness Juan Benjamin is a 89 y.o. male with medical history significant of AAA, AS, HTN, hypothyroidism. Niece visited patient, was concerned due to generalized weakness and debility so pt brought in to ED. Pt reported that he had some episodes of diarrhea, 3 episodes per day, no abdominal pain, feeling weaker and more fatigued but no vomiting. Pt reported decreased p.o. intake and that he feels lightheaded when he is standing.   Clinical Impression   Pt present with decline in function and safety with ADLs and ADL mobility wit impaired strength, balance, endurance and cognition (pt with hx dementia). PTA pt lived at home alone and has a niece that checks on him. Pt is a poor historian and reports that he is Ind with ADLs/IADLs, mobility, cooking and drives. Pt also believes that he lives with his wife (wife is deceased) and next door to his mother in law. Pt's RN reports that pt's niece has reported that pt does not drive, doesn't leave his home, eats very little and that she would like for hom to have LTC placement at some point. Pt currently requires Sup to sit EOB, mod A with LB ADLs, min A with mobility/transfers using RW. Pt pleasantly confused and cooperative. Pt would benefit from acute OT services to address impairments to maximize level of function and safety      Recommendations for follow up therapy are one component of a multi-disciplinary discharge planning process, led by the attending physician.  Recommendations may be updated based on patient status, additional functional criteria and insurance authorization.   Assistance Recommended at Discharge Frequent or constant Supervision/Assistance  Patient can return home with the following A little help with bathing/dressing/bathroom;A little help with walking and/or transfers;Assistance  with cooking/housework;Direct supervision/assist for financial management;Direct supervision/assist for medications management;Assist for transportation    Functional Status Assessment  Patient has had a recent decline in their functional status and demonstrates the ability to make significant improvements in function in a reasonable and predictable amount of time.  Equipment Recommendations  Other (comment) (TBD at next venue of care)    Recommendations for Other Services  PT consult     Precautions / Restrictions Precautions Precautions: Fall Restrictions Weight Bearing Restrictions: No      Mobility Bed Mobility Overal bed mobility: Needs Assistance Bed Mobility: Supine to Sit     Supine to sit: Supervision     General bed mobility comments: no physical assist    Transfers Overall transfer level: Needs assistance Equipment used: Rolling walker (2 wheels) Transfers: Sit to/from Stand, Bed to chair/wheelchair/BSC Sit to Stand: Min assist Stand pivot transfers: Min assist   Step pivot transfers: Min assist     General transfer comment: min verbal cues for hand placement      Balance Overall balance assessment: Needs assistance Sitting-balance support: No upper extremity supported, Feet supported Sitting balance-Leahy Scale: Fair     Standing balance support: Bilateral upper extremity supported, No upper extremity supported, During functional activity Standing balance-Leahy Scale: Poor                             ADL either performed or assessed with clinical judgement   ADL Overall ADL's : Needs assistance/impaired Eating/Feeding: Set up;Supervision/ safety;Sitting   Grooming: Wash/dry hands;Wash/dry face;Minimal assistance;Standing   Upper Body Bathing: Min guard;Sitting  Lower Body Bathing: Moderate assistance   Upper Body Dressing : Min guard;Sitting   Lower Body Dressing: Moderate assistance   Toilet Transfer: Minimal  assistance;Ambulation;Rolling walker (2 wheels);Cueing for safety   Toileting- Clothing Manipulation and Hygiene: Moderate assistance;Sit to/from stand       Functional mobility during ADLs: Minimal assistance;Rolling walker (2 wheels);Cueing for safety       Vision Baseline Vision/History: 1 Wears glasses Ability to See in Adequate Light: 0 Adequate Patient Visual Report: No change from baseline       Perception     Praxis      Pertinent Vitals/Pain Pain Assessment Pain Assessment: No/denies pain     Hand Dominance Right   Extremity/Trunk Assessment Upper Extremity Assessment Upper Extremity Assessment: Generalized weakness           Communication Communication Communication: HOH   Cognition Arousal/Alertness: Awake/alert Behavior During Therapy: WFL for tasks assessed/performed Overall Cognitive Status: No family/caregiver present to determine baseline cognitive functioning Area of Impairment: Orientation, Memory, Safety/judgement, Awareness                 Orientation Level: Place, Situation   Memory: Decreased short-term memory   Safety/Judgement: Decreased awareness of safety, Decreased awareness of deficits     General Comments: pt with hx of dementia. Pt reports that he drives, grocery shops, cooks and lives with his wife and nextdoor to his mother in law (Pt's wife deceased), however RN reports that pt's niece stated that pt lives alone, doesn't leave his house, doesn't drive and eats very little     General Comments       Exercises     Shoulder Instructions      Home Living Family/patient expects to be discharged to:: Private residence Living Arrangements: Other relatives;Other (Comment) (niece checks on him) Available Help at Discharge: Family;Friend(s);Available PRN/intermittently Type of Home: House Home Access: Stairs to enter [employer] of Steps: 4 Entrance Stairs-Rails: Can reach both Home Layout: One level      Bathroom Shower/Tub: Producer, television/film/video: Handicapped height     Home Equipment: None   Additional Comments: pt is a poor historian, some info obtained from previous chart and RN      Prior Functioning/Environment               Mobility Comments: Pt reports that he used no AD for mobility ADLs Comments: Pt reports that he was Ind with ADLs/selfcare        OT Problem List: Decreased strength;Decreased activity tolerance;Decreased cognition;Decreased safety awareness;Impaired balance (sitting and/or standing);Decreased coordination;Decreased knowledge of use of DME or AE      OT Treatment/Interventions: Self-care/ADL training;Balance training;Therapeutic exercise;DME and/or AE instruction;Therapeutic activities;Patient/family education    OT Goals(Current goals can be found in the care plan section) Acute Rehab OT Goals Patient Stated Goal: "get better" OT Goal Formulation: With patient Time For Goal Achievement: 10/08/22 Potential to Achieve Goals: Good ADL Goals Pt Will Perform Grooming: with min guard assist;with supervision;standing Pt Will Perform Upper Body Bathing: with supervision;with set-up;sitting Pt Will Perform Lower Body Bathing: with min assist;sit to/from stand Pt Will Perform Upper Body Dressing: with supervision;with set-up;sitting Pt Will Transfer to Toilet: with min guard assist;ambulating;grab bars Pt Will Perform Toileting - Clothing Manipulation and hygiene: with min assist;with min guard assist;sit to/from stand  OT Frequency: Min 2X/week    Co-evaluation              AM-PAC OT "6 Clicks" Daily Activity  Outcome Measure Help from another person eating meals?: None Help from another person taking care of personal grooming?: A Little Help from another person toileting, which includes using toliet, bedpan, or urinal?: A Little Help from another person bathing (including washing, rinsing, drying)?: A Little Help from another  person to put on and taking off regular upper body clothing?: A Little Help from another person to put on and taking off regular lower body clothing?: A Little 6 Click Score: 19   End of Session Equipment Utilized During Treatment: Gait belt;Rolling walker (2 wheels) Nurse Communication: Mobility status  Activity Tolerance: Patient tolerated treatment well Patient left: in chair;with call bell/phone within reach;with chair alarm set  OT Visit Diagnosis: Unsteadiness on feet (R26.81);Muscle weakness (generalized) (M62.81);Other symptoms and signs involving cognitive function                Time: 3086-5784 OT Time Calculation (min): 26 min Charges:  OT General Charges $OT Visit: 1 Visit OT Evaluation $OT Eval Moderate Complexity: 1 Mod OT Treatments $Therapeutic Activity: 8-22 mins    Galen Manila 09/24/2022, 2:21 PM

## 2022-09-24 NOTE — Assessment & Plan Note (Signed)
Present last month as well. ? PNA on CXR today. However, no fever, chills, productive cough, etc. Got rocephin / azithro in ED Check procalcitonin Check blood smear to make sure nothing that looks neoplastic Watch for signs / symptoms of PNA Holding off on further ABx for the moment pending procalcitonin

## 2022-09-24 NOTE — Evaluation (Signed)
Clinical/Bedside Swallow Evaluation Patient Details  Name: Juan Benjamin MRN: [id number] Date of Birth: [birth year]  Today's Date: 09/24/2022 Time: SLP Start Time (ACUTE ONLY): 1512 SLP Stop Time (ACUTE ONLY): 1530 SLP Time Calculation (min) (ACUTE ONLY): 18 min  Past Medical History:  Past Medical History:  Diagnosis Date   AAA (abdominal aortic aneurysm) (HCC)    Aortic stenosis    Carotid artery occlusion    Chronic occlusion of left, R CEA   CKD (chronic kidney disease) stage 4, GFR 15-29 ml/min (HCC) 09/24/2022   Coronary artery disease    GERD (gastroesophageal reflux disease)    Hyperlipidemia    Hypertension    Hypothyroidism    PAF (paroxysmal atrial fibrillation) (HCC)    Pneumonia    Prostate cancer (HCC)    Past Surgical History:  Past Surgical History:  Procedure Laterality Date   BILIARY STENT PLACEMENT  09/29/2020   Procedure: BILIARY STENT PLACEMENT;  Surgeon: Lemar Lofty., MD;  Location: [facility] ENDOSCOPY;  Service: Gastroenterology;;   BILIARY STENT PLACEMENT N/A 12/15/2020   Procedure: BILIARY STENT PLACEMENT;  Surgeon: Lemar Lofty., MD;  Location: [facility] ENDOSCOPY;  Service: Gastroenterology;  Laterality: N/A;   BIOPSY  09/29/2020   Procedure: BIOPSY;  Surgeon: Meridee Score Netty Starring., MD;  Location: [facility] ENDOSCOPY;  Service: Gastroenterology;;   CAROTID ARTERY SURGERY     Right    CHOLECYSTECTOMY N/A 09/26/2020   Procedure: LAPAROSCOPIC SUBTOTAL CHOLECYSTECTOMY;  Surgeon: Diamantina Monks, MD;  Location: [facility] OR;  Service: General;  Laterality: N/A;   CORONARY ARTERY BYPASS GRAFT     CABG (4 vessel, 2000.  No report in [facility])   ENDOSCOPIC RETROGRADE CHOLANGIOPANCREATOGRAPHY (ERCP) WITH PROPOFOL N/A 09/29/2020   Procedure: ENDOSCOPIC RETROGRADE CHOLANGIOPANCREATOGRAPHY (ERCP) WITH PROPOFOL;  Surgeon: Meridee Score Netty Starring., MD;  Location: [facility] ENDOSCOPY;  Service: Gastroenterology;  Laterality: N/A;   ENDOSCOPIC RETROGRADE CHOLANGIOPANCREATOGRAPHY (ERCP)  WITH PROPOFOL N/A 12/15/2020   Procedure: ENDOSCOPIC RETROGRADE CHOLANGIOPANCREATOGRAPHY (ERCP) WITH PROPOFOL;  Surgeon: Meridee Score Netty Starring., MD;  Location: [facility] ENDOSCOPY;  Service: Gastroenterology;  Laterality: N/A;   ENDOSCOPIC RETROGRADE CHOLANGIOPANCREATOGRAPHY (ERCP) WITH PROPOFOL N/A 03/12/2021   Procedure: ENDOSCOPIC RETROGRADE CHOLANGIOPANCREATOGRAPHY (ERCP) WITH PROPOFOL;  Surgeon: Meridee Score Netty Starring., MD;  Location: [facility] ENDOSCOPY;  Service: Gastroenterology;  Laterality: N/A;   HEMOSTASIS CLIP PLACEMENT  12/15/2020   Procedure: HEMOSTASIS CLIP PLACEMENT;  Surgeon: Meridee Score Netty Starring., MD;  Location: [facility] ENDOSCOPY;  Service: Gastroenterology;;   MELANOMA EXCISION WITH SENTINEL LYMPH NODE BIOPSY Left 03/21/2020   Procedure: WIDE LOCAL EXCISION LEFT UPPER ARM MELANOMA ADVANCEMENT FLAP CLOSURE FOR DEFECT 4X8CM SENTINEL LYMPH NODE MAPPING AND BIOPSY;  Surgeon: Almond Lint, MD;  Location: [facility] OR;  Service: General;  Laterality: Left;   PANCREATIC STENT PLACEMENT  09/29/2020   Procedure: PANCREATIC STENT PLACEMENT;  Surgeon: Lemar Lofty., MD;  Location: [facility] ENDOSCOPY;  Service: Gastroenterology;;   REMOVAL OF STONES  09/29/2020   Procedure: REMOVAL OF STONES;  Surgeon: Lemar Lofty., MD;  Location: [facility] ENDOSCOPY;  Service: Gastroenterology;;   REMOVAL OF STONES  12/15/2020   Procedure: REMOVAL OF STONES;  Surgeon: Lemar Lofty., MD;  Location: [facility] ENDOSCOPY;  Service: Gastroenterology;;   REMOVAL OF STONES  03/12/2021   Procedure: REMOVAL OF SLUDGE;  Surgeon: Lemar Lofty., MD;  Location: [facility] ENDOSCOPY;  Service: Gastroenterology;;   RENAL ARTERY STENT  07/02/1999   RIGHT SIDE   SPHINCTEROTOMY  09/29/2020   Procedure: SPHINCTEROTOMY;  Surgeon: Mansouraty, Netty Starring., MD;  Location: [facility] ENDOSCOPY;  Service: Gastroenterology;;  STENT REMOVAL  12/15/2020   Procedure: STENT REMOVAL;  Surgeon: Lemar Lofty., MD;  Location: [facility] ENDOSCOPY;  Service:  Gastroenterology;;   Francine Graven REMOVAL  03/12/2021   Procedure: STENT REMOVAL;  Surgeon: Lemar Lofty., MD;  Location: [facility] ENDOSCOPY;  Service: Gastroenterology;;   HPI:  Juan Benjamin is a 89 y.o. male who presented to ED from home due to niece's concerns for generalized weakness, poor PO intake.  Prior medical history significant for AAA, AS, HTN, hypothyroidism.    Assessment / Plan / Recommendation  Clinical Impression  Pt participated in clinical swallowing exam.  He followed commands intermittently, was quite confused and focused on going home and speaking with his niece.  He could be redirected easily. Oral mechanism exam was normal. He is edentulous and stated his dentures were at home. His bilateral hearing aids were not functioning (RN states family will bring in HA chargers).  He was not interested in eating, but could be encouraged to consume small amounts. There was adequate mastication of soft solids, the appearance of a brisk swallow, and no s/s of aspiration with thin liquids. Recommend continuing current diet; may give meds whole in puree if easier for pt.  There are no current concerns for a dysphagia, but pt's poor awareness/judgment will certainly impact nutrition. D/W RN. Our service will sign off. SLP Visit Diagnosis: Dysphagia, unspecified (R13.10)    Aspiration Risk  No limitations    Diet Recommendation   Soft/mechanical, thin liquids  Medication Administration: Whole meds with liquid - if coughing, give with puree   Other  Recommendations Oral Care Recommendations: Oral care BID    Recommendations for follow up therapy are one component of a multi-disciplinary discharge planning process, led by the attending physician.  Recommendations may be updated based on patient status, additional functional criteria and insurance authorization.  Follow up Recommendations No SLP follow up        Swallow Study   General Date of Onset: 09/23/22 HPI: Juan Benjamin  is a 89 y.o. male who presented to ED from home due to niece's concerns for generalized weakness, poor PO intake.  Prior medical history significant for AAA, AS, HTN, hypothyroidism. Type of Study: Bedside Swallow Evaluation Previous Swallow Assessment: no Diet Prior to this Study: Other (Comment) (soft diet) Temperature Spikes Noted: No Respiratory Status: Room air History of Recent Intubation: No Behavior/Cognition: Alert;Cooperative;Confused Oral Cavity Assessment: Within Functional Limits Oral Care Completed by SLP: No Oral Cavity - Dentition: Edentulous Vision: Functional for self-feeding Self-Feeding Abilities: Needs assist Patient Positioning: Upright in bed Baseline Vocal Quality: Normal Volitional Cough: Cognitively unable to elicit Volitional Swallow: Unable to elicit    Oral/Motor/Sensory Function Overall Oral Motor/Sensory Function: Within functional limits   Ice Chips Ice chips: Within functional limits   Thin Liquid      Nectar Thick Nectar Thick Liquid: Not tested   Honey Thick Honey Thick Liquid: Not tested   Puree Puree: Within functional limits   Solid     Solid: Within functional limits      Blenda Mounts Laurice 09/24/2022,3:43 PM  Marchelle Folks L. Samson Frederic, MA CCC/SLP Clinical Specialist - Acute Care SLP Acute Rehabilitation Services Office number [phone]

## 2022-09-24 NOTE — Progress Notes (Addendum)
Patient admitted earlier this morning.  H&P reviewed.  Patient seen and examined.  Patient denies any abdominal pain chest pain shortness of breath nausea or vomiting.  Somewhat distracted and slow to respond to questions.  Vital signs reviewed.  Lungs are clear to auscultation bilaterally. S1-S2 is normal regular.  Systolic murmur appreciated over the precordium. Abdomen is soft.  Nontender nondistended. No obvious focal neurological deficits.  Labs reviewed.  Creatinine noted to be 2.05 today.  Procalcitonin 5.52.  WBC 23.1.  TSH 0.976. Echocardiogram is pending.  Patient here with generalized weakness without any clear-cut etiology.  Workup is in progress.  Follow-up on echocardiogram report.   There was mention of diarrhea.  Stool studies are pending.  There could have been an element of dehydration.  His creatinine was higher than his usual baseline at 2.28.  He was given IV fluids in the ED.  Will continue with IV fluids for now.  His calcium level has improved slightly.  Will continue hydration and recheck labs in the morning.  Leukocytosis is noted.  It looks like he was recently treated for pneumonia.  He received Rocephin and azithromycin in the ED.  Procalcitonin is noted to be elevated.  WBC remains elevated today.  With elevated procalcitonin level it is reasonable to continue with antibiotics.  Will order blood cultures.  Other medical issues as mentioned on the H&P.  Will continue to follow.  Osvaldo Shipper 09/24/2022

## 2022-09-24 NOTE — Assessment & Plan Note (Signed)
Despite very thin body habitus, AS murmur, while present, sounds very muffled. AS was mod-severe in July 2023. ? If severe to critical AS is causing his generalized weakness symptoms now? 2d echo ordered to investigate further.

## 2022-09-24 NOTE — Assessment & Plan Note (Signed)
Stage 1, sacrum. Local wound care

## 2022-09-25 ENCOUNTER — Encounter (HOSPITAL_COMMUNITY): Payer: Self-pay | Admitting: Internal Medicine

## 2022-09-25 ENCOUNTER — Inpatient Hospital Stay (HOSPITAL_COMMUNITY): Payer: PPO

## 2022-09-25 DIAGNOSIS — N179 Acute kidney failure, unspecified: Secondary | ICD-10-CM | POA: Diagnosis not present

## 2022-09-25 DIAGNOSIS — I714 Abdominal aortic aneurysm, without rupture, unspecified: Secondary | ICD-10-CM | POA: Diagnosis not present

## 2022-09-25 DIAGNOSIS — R531 Weakness: Secondary | ICD-10-CM | POA: Diagnosis not present

## 2022-09-25 DIAGNOSIS — J189 Pneumonia, unspecified organism: Secondary | ICD-10-CM | POA: Diagnosis not present

## 2022-09-25 LAB — PROCALCITONIN: Procalcitonin: 6.1 ng/mL

## 2022-09-25 LAB — CBC WITH DIFFERENTIAL/PLATELET
Abs Immature Granulocytes: 0.23 10*3/uL — ABNORMAL HIGH (ref 0.00–0.07)
Basophils Absolute: 0.1 10*3/uL (ref 0.0–0.1)
Basophils Relative: 0 %
Eosinophils Absolute: 0.1 10*3/uL (ref 0.0–0.5)
Eosinophils Relative: 1 %
HCT: 40.1 % (ref 39.0–52.0)
Hemoglobin: 13.1 g/dL (ref 13.0–17.0)
Immature Granulocytes: 1 %
Lymphocytes Relative: 7 %
Lymphs Abs: 1.5 10*3/uL (ref 0.7–4.0)
MCH: 28.7 pg (ref 26.0–34.0)
MCHC: 32.7 g/dL (ref 30.0–36.0)
MCV: 87.7 fL (ref 80.0–100.0)
Monocytes Absolute: 1.5 10*3/uL — ABNORMAL HIGH (ref 0.1–1.0)
Monocytes Relative: 7 %
Neutro Abs: 17.4 10*3/uL — ABNORMAL HIGH (ref 1.7–7.7)
Neutrophils Relative %: 84 %
Platelets: 191 10*3/uL (ref 150–400)
RBC: 4.57 MIL/uL (ref 4.22–5.81)
RDW: 14.1 % (ref 11.5–15.5)
WBC: 20.9 10*3/uL — ABNORMAL HIGH (ref 4.0–10.5)
nRBC: 0 % (ref 0.0–0.2)

## 2022-09-25 LAB — RENAL FUNCTION PANEL
Albumin: 2.3 g/dL — ABNORMAL LOW (ref 3.5–5.0)
Anion gap: 12 (ref 5–15)
BUN: 35 mg/dL — ABNORMAL HIGH (ref 8–23)
CO2: 21 mmol/L — ABNORMAL LOW (ref 22–32)
Calcium: 11.2 mg/dL — ABNORMAL HIGH (ref 8.9–10.3)
Chloride: 107 mmol/L (ref 98–111)
Creatinine, Ser: 1.92 mg/dL — ABNORMAL HIGH (ref 0.61–1.24)
GFR, Estimated: 33 mL/min — ABNORMAL LOW (ref >60)
Glucose, Bld: 104 mg/dL — ABNORMAL HIGH (ref 70–99)
Phosphorus: 3 mg/dL (ref 2.5–4.6)
Potassium: 3.6 mmol/L (ref 3.5–5.1)
Sodium: 140 mmol/L (ref 135–145)

## 2022-09-25 LAB — BASIC METABOLIC PANEL
Anion gap: 11 (ref 5–15)
BUN: 36 mg/dL — ABNORMAL HIGH (ref 8–23)
CO2: 22 mmol/L (ref 22–32)
Calcium: 11.2 mg/dL — ABNORMAL HIGH (ref 8.9–10.3)
Chloride: 107 mmol/L (ref 98–111)
Creatinine, Ser: 1.85 mg/dL — ABNORMAL HIGH (ref 0.61–1.24)
GFR, Estimated: 34 mL/min — ABNORMAL LOW (ref >60)
Glucose, Bld: 90 mg/dL (ref 70–99)
Potassium: 3 mmol/L — ABNORMAL LOW (ref 3.5–5.1)
Sodium: 140 mmol/L (ref 135–145)

## 2022-09-25 LAB — PARATHYROID HORMONE, INTACT (NO CA): PTH: 32 pg/mL (ref 15–65)

## 2022-09-25 LAB — CULTURE, BLOOD (ROUTINE X 2)

## 2022-09-25 MED ORDER — FOOD THICKENER (SIMPLYTHICK): 1.0000 | ORAL | Status: DC | PRN

## 2022-09-25 MED ORDER — ASPIRIN 81 MG PO TBEC
81.0000 mg | DELAYED_RELEASE_TABLET | Freq: Every day | ORAL | Status: DC
  Administered 2022-09-25 – 2022-09-30 (×6): 81 mg via ORAL
  Filled 2022-09-25 (×6): qty 1

## 2022-09-25 MED ORDER — ROSUVASTATIN CALCIUM 10 MG PO TABS
10.0000 mg | ORAL_TABLET | Freq: Every day | ORAL | Status: DC
  Administered 2022-09-25 – 2022-09-29 (×5): 10 mg via ORAL
  Filled 2022-09-25 (×5): qty 1

## 2022-09-25 MED ORDER — ALLOPURINOL 100 MG PO TABS
100.0000 mg | ORAL_TABLET | Freq: Every day | ORAL | Status: DC
  Administered 2022-09-25 – 2022-09-29 (×5): 100 mg via ORAL
  Filled 2022-09-25 (×5): qty 1

## 2022-09-25 NOTE — Progress Notes (Signed)
[facility] PROGRESS NOTE    Progress Note  EROL FLANAGIN  ZOX:[id number] DOB: Oct 01, 1930 DOA: 09/23/2022 PCP: Richmond Campbell., PA-C     Brief Narrative:   [name] is an 89 y.o. male past medical history of AAA, aortic stenosis essential hypertension paroxysmal atrial fibrillation on anticoagulation vomiting due to generalized weakness with 3 days of diarrhea no abdominal pain  Assessment/Plan:   Generalized weakness of unclear etiology: Echo EF of 55% moderate asymmetric left ventricular hypertrophy aortic valve shows gradient 26 mmHg suspect moderate  aortic stenosis. Calcium 11.2, Gentle IV fluids Leukocytosis, slowly improving this morning is 21, Tmax 97.8. Stools studies are pending. Chest x-ray showed increased peribronchial opacity right greater than left,, procalcitonin is elevated with cytosis of 23,000, I am concerned about community-acquired pneumonia versus aspiration pneumonia. Continue empiric antibiotics check a CT scan of the chest. Patient family confirmed that he has been coughing every time he eats or drink. Check a swallowing evaluation. PT recommended skilled nursing facility.  Leukocytosis: Question due to pneumonia higher than last month. Procalcitonin is elevated.  Moderate aortic stenosis 2D echo showed moderate to severe aortic stenosis. Will notify cardiology.  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 TSH of 0.9, vitamin D 50, PTH and PTH related peptide are pending. Phosphorus is pending.  Chronic kidney disease stage IV: Seems to be at baseline.  AAA: Follow-up with PCP as an outpatient.  Essential hypertension: Blood pressure is actually quite stable hold amlodipine and propranolol.  Stage I sacral decubitus ulcer present on admission: Continue wound care. RN Pressure Injury Documentation: Pressure Injury 09/24/22 Buttocks Lower Stage 1 -  Intact skin with  non-blanchable redness of a localized area usually over a bony prominence. redness (Active)  09/24/22 0136  Location: Buttocks  Location Orientation: Lower  Staging: Stage 1 -  Intact skin with non-blanchable redness of a localized area usually over a bony prominence.  Wound Description (Comments): redness  Present on Admission: Yes  Dressing Type Foam - Lift dressing to assess site every shift 09/24/22 2030    DVT prophylaxis: lovenox Family Communication:niece Status is: Inpatient Remains inpatient appropriate because: Generalized weakness.    Code Status:     Code Status Orders  (From admission, onward)           Start     Ordered   09/24/22 0251  Full code  Continuous       Question:  By:  Answer:  Default: patient does not have capacity for decision making, no surrogate or prior directive available   09/24/22 0254           Code Status History     Date Active Date Inactive Code Status Order ID Comments User Context   09/25/2020 0314 10/04/2020 1802 Full Code [id number]  Hillary Bow, DO ED         IV Access:   Peripheral IV   Procedures and diagnostic studies:   ECHOCARDIOGRAM COMPLETE  Result Date: 09/24/2022    ECHOCARDIOGRAM REPORT   Patient Name:   Juan Benjamin Date of Exam: 09/24/2022 Medical Rec #:  [id number]        Height:       67.0 in Accession #:    [id number]       Weight:       135.1 lb Date of Birth:  24-Apr-1931        BSA:  1.712 m Patient Age:    89 years         BP:           126/62 mmHg Patient Gender: M                HR:           67 bpm. Exam Location:  Inpatient Procedure: 2D Echo, Color Doppler and Cardiac Doppler Indications:    Aortic stenosis  History:        Patient has prior history of Echocardiogram examinations. CAD;                 Risk Factors:Hypertension and Dyslipidemia.  Sonographer:    Mike Gip Referring Phys: [phone] JARED M GARDNER IMPRESSIONS  1. Left ventricular ejection fraction, by estimation, is 55%.  The left ventricle has low normal function. The left ventricle has no regional wall motion abnormalities. There is moderate asymmetric left ventricular hypertrophy of the septal segment. Indeterminate diastolic filling due to E-A fusion.  2. Right ventricular systolic function is normal. The right ventricular size is normal. There is normal pulmonary artery systolic pressure.  3. The mitral valve is grossly normal. No evidence of mitral valve regurgitation.  4. Suboptimal LVOT VTI assessment precludes accuate LV SV indexing, DVI assessment or continuity equation evaluation. Visual aortic valve appears calcified with reduced opening. Mean gradient 26 mm Hg with acceleration tim 111 ms. Suspect at least moderate to severe AS. The aortic valve is calcified. Aortic valve regurgitation is mild. Moderate to severe aortic valve stenosis. Aortic valve mean gradient measures 26.0 mmHg. Comparison(s): No significant change from prior study. FINDINGS  Left Ventricle: Left ventricular ejection fraction, by estimation, is 55%. The left ventricle has low normal function. The left ventricle has no regional wall motion abnormalities. The left ventricular internal cavity size was normal in size. There is moderate asymmetric left ventricular hypertrophy of the septal segment. Indeterminate diastolic filling due to E-A fusion. Right Ventricle: The right ventricular size is normal. Right vetricular wall thickness was not well visualized. Right ventricular systolic function is normal. There is normal pulmonary artery systolic pressure. The tricuspid regurgitant velocity is 2.01 m/s, and with an assumed right atrial pressure of 3 mmHg, the estimated right ventricular systolic pressure is 19.2 mmHg. Left Atrium: Left atrial size was normal in size. Right Atrium: Right atrial size was normal in size. Pericardium: There is no evidence of pericardial effusion. Mitral Valve: The mitral valve is grossly normal. No evidence of mitral valve  regurgitation. Tricuspid Valve: The tricuspid valve is normal in structure. Tricuspid valve regurgitation is not demonstrated. Aortic Valve: Suboptimal LVOT VTI assessment precludes accuate LV SV indexing, DVI assessment or continuity equation evaluation. Visual aortic valve appears calcified with reduced opening. Mean gradient 26 mm Hg with acceleration tim 111 ms. Suspect at least moderate to severe AS. The aortic valve is calcified. Aortic valve regurgitation is mild. Aortic regurgitation PHT measures 587 msec. Moderate to severe aortic stenosis is present. Aortic valve mean gradient measures 26.0 mmHg. Aortic valve peak gradient measures 39.3 mmHg. Aortic valve area, by VTI measures 0.53 cm. Pulmonic Valve: The pulmonic valve was grossly normal. Pulmonic valve regurgitation is mild. Aorta: The aortic root and ascending aorta are structurally normal, with no evidence of dilitation. IAS/Shunts: The interatrial septum was not well visualized.  LEFT VENTRICLE PLAX 2D LVIDd:         3.80 cm     Diastology LVIDs:  2.80 cm     LV e' medial:    4.24 cm/s LV PW:         1.10 cm     LV E/e' medial:  18.2 LV IVS:        1.30 cm     LV e' lateral:   7.18 cm/s LVOT diam:     1.90 cm     LV E/e' lateral: 10.8 LV SV:         38 LV SV Index:   22 LVOT Area:     2.84 cm  LV Volumes (MOD) LV vol d, MOD A2C: 79.1 ml LV vol d, MOD A4C: 89.7 ml LV vol s, MOD A2C: 32.7 ml LV vol s, MOD A4C: 38.9 ml LV SV MOD A2C:     46.4 ml LV SV MOD A4C:     89.7 ml LV SV MOD BP:      53.2 ml RIGHT VENTRICLE            IVC RV Basal diam:  3.60 cm    IVC diam: 1.50 cm RV S prime:     7.72 cm/s TAPSE (M-mode): 1.2 cm LEFT ATRIUM             Index        RIGHT ATRIUM           Index LA diam:        3.50 cm 2.04 cm/m   RA Area:     18.30 cm LA Vol (A2C):   35.1 ml 20.50 ml/m  RA Volume:   48.00 ml  28.04 ml/m LA Vol (A4C):   34.7 ml 20.27 ml/m LA Biplane Vol: 37.2 ml 21.73 ml/m  AORTIC VALVE AV Area (Vmax):    0.62 cm AV Area (Vmean):    0.60 cm AV Area (VTI):     0.53 cm AV Vmax:           313.33 cm/s AV Vmean:          219.667 cm/s AV VTI:            0.716 m AV Peak Grad:      39.3 mmHg AV Mean Grad:      26.0 mmHg LVOT Vmax:         69.00 cm/s LVOT Vmean:        46.700 cm/s LVOT VTI:          0.133 m LVOT/AV VTI ratio: 0.19 AI PHT:            587 msec  AORTA Ao Root diam: 3.80 cm Ao Asc diam:  3.00 cm MITRAL VALVE               TRICUSPID VALVE MV Area (PHT): 6.60 cm    TR Peak grad:   16.2 mmHg MV Decel Time: 115 msec    TR Vmax:        201.00 cm/s MV E velocity: 77.25 cm/s MV A velocity: 99.90 cm/s  SHUNTS MV E/A ratio:  0.77        Systemic VTI:  0.13 m                            Systemic Diam: 1.90 cm Riley Lam MD Electronically signed by Riley Lam MD Signature Date/Time: 09/24/2022/12:35:20 PM    Final    CT ABDOMEN PELVIS WO CONTRAST  Result Date: 09/23/2022 CLINICAL DATA:  Acute abdominal pain EXAM:  CT ABDOMEN AND PELVIS WITHOUT CONTRAST TECHNIQUE: Multidetector CT imaging of the abdomen and pelvis was performed following the standard protocol without IV contrast. RADIATION DOSE REDUCTION: This exam was performed according to the departmental dose-optimization program which includes automated exposure control, adjustment of the mA and/or kV according to patient size and/or use of iterative reconstruction technique. COMPARISON:  CT abdomen and pelvis 06/19/2021 FINDINGS: Lower chest: Emphysematous changes are again seen. There is some scarring in the lung bases. Hepatobiliary: No focal liver abnormality is seen. No gallstones, gallbladder wall thickening, or biliary dilatation. Pancreas: Unremarkable. No pancreatic ductal dilatation or surrounding inflammatory changes. Spleen: Normal in size without focal abnormality. Adrenals/Urinary Tract: Right renal atrophy is stable. There are numerous bilateral renal cysts measuring up to 5.7 cm on the right and 2.0 cm on the left. There is no hydronephrosis or urinary tract  calculus. The bladder and adrenal glands are within normal limits. Stomach/Bowel: Stomach is within normal limits. Appendix appears normal. No evidence of bowel wall thickening, distention, or inflammatory changes. There are scattered colonic diverticula. Vascular/Lymphatic: There is aneurysmal dilatation of the infrarenal abdominal aorta measuring 4.5 cm (previously 4.3 cm) there is also aneurysmal dilatation of the left common iliac artery measuring 4.0 cm (unchanged). There severe atherosclerotic calcifications throughout the aorta. No enlarged lymph nodes are seen. Reproductive: Prostate gland is mildly enlarged. Other: There are small fat containing bilateral inguinal hernias. No ascites. Musculoskeletal: Degenerative changes affect the spine. IMPRESSION: 1. No acute localizing process in the abdomen or pelvis. 2. 4.5 cm infrarenal abdominal aortic aneurysm, previously 4.3 cm. Recommend follow-up CT/MR every 6 months and vascular consultation. This recommendation follows ACR consensus guidelines: White Paper of the ACR Incidental Findings Committee II on Vascular Findings. J Am Coll Radiol 2013; 10:789-794. 3. 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 4. Colonic diverticulosis without evidence for diverticulitis. Aortic Atherosclerosis (ICD10-I70.0). Electronically Signed   By: Darliss Cheney M.D.   On: 09/23/2022 21:25   DG Chest 2 View  Result Date: 09/23/2022 CLINICAL DATA:  Weakness EXAM: CHEST - 2 VIEW COMPARISON:  08/10/2022 FINDINGS: Postsurgical changes of median sternotomy and left axillary surgery. Chronic left basilar scarring. No focal airspace consolidation. Increased peripheral peribronchial opacities, right greater than left. IMPRESSION: Increased peripheral peribronchial opacities, right greater than left, which may represent active infection or sequela of recent pneumonia. Electronically Signed   By: Deatra Robinson M.D.   On: 09/23/2022 19:53     Medical Consultants:    None.   Subjective:    REMIEL CORTI is coughing every time he eats or drinks.  His niece confirms it  Objective:    Vitals:   09/24/22 0926 09/24/22 1349 09/24/22 2104 09/25/22 0348  BP: 123/85 124/70 107/72 134/87  Pulse: (!) 58 64 77 75  Resp: 15 17 16 16   Temp: (!) 97.5 F (36.4 C) 97.6 F (36.4 C) 97.8 F (36.6 C) (!) 97.5 F (36.4 C)  TempSrc: Oral  Axillary Oral  SpO2: 96% 96% 95% 96%  Weight:      Height:       SpO2: 96 %   Intake/Output Summary (Last 24 hours) at 09/25/2022 0911 Last data filed at 09/25/2022 0204 Gross per 24 hour  Intake 120 ml  Output 1100 ml  Net -980 ml   Filed Weights   09/24/22 0100  Weight: 61.3 kg    Exam: General exam: In no acute distress. Respiratory system: Good air movement and diffuse crackles more prominent on the  right than on the left Cardiovascular system: S1 & S2 heard, RRR. No JVD. Gastrointestinal system: Abdomen is nondistended, soft and nontender.  Extremities: No pedal edema. Skin: No rashes, lesions or ulcers Psychiatry: Judgement and insight appear normal. Mood & affect appropriate.    Data Reviewed:    Labs: Basic Metabolic Panel: Recent Labs  Lab 09/23/22 1854 09/24/22 0547 09/25/22 0531  NA 140 143 140  K 4.0 3.9 3.0*  CL 104 108 107  CO2 24 24 22   GLUCOSE 116* 99 90  BUN 43* 40* 36*  CREATININE 2.28* 2.05* 1.85*  CALCIUM 11.7* 11.3* 11.2*   GFR Estimated Creatinine Clearance: 23.5 mL/min (A) (by C-G formula based on SCr of 1.85 mg/dL (H)). Liver Function Tests: Recent Labs  Lab 09/23/22 1854  AST 21  ALT 9  ALKPHOS 76  BILITOT 1.3*  PROT 5.6*  ALBUMIN 2.5*   No results for input(s): "LIPASE", "AMYLASE" in the last 168 hours. No results for input(s): "AMMONIA" in the last 168 hours. Coagulation profile No results for input(s): "INR", "PROTIME" in the last 168 hours. COVID-19 Labs  No results for input(s): "DDIMER", "FERRITIN", "LDH", "CRP" in the last 72 hours.  Lab  Results  Component Value Date   SARSCOV2NAA NEGATIVE 08/10/2022   SARSCOV2NAA NEGATIVE 09/24/2020   SARSCOV2NAA NEGATIVE 03/17/2020    CBC: Recent Labs  Lab 09/23/22 1854 09/24/22 0547 09/25/22 0531  WBC 25.1* 23.1* 20.9*  NEUTROABS 21.4*  --  17.4*  HGB 13.8 14.3 13.1  HCT 41.9 43.7 40.1  MCV 88.0 88.6 87.7  PLT 195 192 191   Cardiac Enzymes: No results for input(s): "CKTOTAL", "CKMB", "CKMBINDEX", "TROPONINI" in the last 168 hours. BNP (last 3 results) No results for input(s): "PROBNP" in the last 8760 hours. CBG: No results for input(s): "GLUCAP" in the last 168 hours. D-Dimer: No results for input(s): "DDIMER" in the last 72 hours. Hgb A1c: No results for input(s): "HGBA1C" in the last 72 hours. Lipid Profile: No results for input(s): "CHOL", "HDL", "LDLCALC", "TRIG", "CHOLHDL", "LDLDIRECT" in the last 72 hours. Thyroid function studies: Recent Labs    09/24/22 0547  TSH 0.976   Anemia work up: No results for input(s): "VITAMINB12", "FOLATE", "FERRITIN", "TIBC", "IRON", "RETICCTPCT" in the last 72 hours. Sepsis Labs: Recent Labs  Lab 09/23/22 1854 09/23/22 2014 09/24/22 0547 09/25/22 0531  PROCALCITON  --   --  5.52 6.10  WBC 25.1*  --  23.1* 20.9*  LATICACIDVEN  --  1.2  --   --    Microbiology Recent Results (from the past 240 hour(s))  Culture, blood (Routine X 2) w Reflex to ID Panel     Status: None (Preliminary result)   Collection Time: 09/24/22 11:14 AM   Specimen: BLOOD  Result Value Ref Range Status   Specimen Description   Final    BLOOD BLOOD LEFT ARM AEROBIC BOTTLE ONLY Performed at [facility], [address] [place], [place] [address]    Special Requests   Final    BOTTLES DRAWN AEROBIC ONLY Blood Culture adequate volume Performed at [facility], [address] [place], [place] [address]    Culture   Final    NO GROWTH < 24 HOURS Performed at [facility], [address],  [place], [place] [address]    Report Status PENDING  Incomplete  Culture, blood (Routine X 2) w Reflex to ID Panel     Status: None (Preliminary result)   Collection Time: 09/24/22  11:14 AM   Specimen: BLOOD  Result Value Ref Range Status   Specimen Description   Final    BLOOD BLOOD RIGHT ARM AEROBIC BOTTLE ONLY Performed at [facility], [address] [place], [place] [address]    Special Requests   Final    BOTTLES DRAWN AEROBIC ONLY Blood Culture adequate volume Performed at [facility], [address] [place], [place] [address]    Culture   Final    NO GROWTH < 24 HOURS Performed at [facility], [address] [place], [place] [address]    Report Status PENDING  Incomplete     Medications:    azithromycin  500 mg Oral Q24H   enoxaparin (LOVENOX) injection  30 mg Subcutaneous Q24H   feeding supplement  237 mL Oral BID BM   multivitamin with minerals  1 tablet Oral Daily   Continuous Infusions:  sodium chloride 75 mL/hr at 09/24/22 1122   cefTRIAXone (ROCEPHIN)  IV 1 g (09/25/22 0117)      LOS: 1 day   Marinda Elk  [facility]  09/25/2022, 9:11 AM

## 2022-09-25 NOTE — Evaluation (Signed)
Clinical/Bedside Swallow Evaluation Patient Details  Name: Juan Benjamin MRN: [id number] Date of Birth: 01/20/1931  Today's Date: 09/25/2022 Time: SLP Start Time (ACUTE ONLY): 1005 SLP Stop Time (ACUTE ONLY): 1035 SLP Time Calculation (min) (ACUTE ONLY): 30 min  Past Medical History:  Past Medical History:  Diagnosis Date   AAA (abdominal aortic aneurysm) (HCC)    Aortic stenosis    Carotid artery occlusion    Chronic occlusion of left, R CEA   CKD (chronic kidney disease) stage 4, GFR 15-29 ml/min (HCC) 09/24/2022   Coronary artery disease    GERD (gastroesophageal reflux disease)    Hyperlipidemia    Hypertension    Hypothyroidism    PAF (paroxysmal atrial fibrillation) (HCC)    Pneumonia    Prostate cancer (HCC)    Past Surgical History:  Past Surgical History:  Procedure Laterality Date   BILIARY STENT PLACEMENT  09/29/2020   Procedure: BILIARY STENT PLACEMENT;  Surgeon: Lemar Lofty., MD;  Location: [facility] ENDOSCOPY;  Service: Gastroenterology;;   BILIARY STENT PLACEMENT N/A 12/15/2020   Procedure: BILIARY STENT PLACEMENT;  Surgeon: Lemar Lofty., MD;  Location: [facility] ENDOSCOPY;  Service: Gastroenterology;  Laterality: N/A;   BIOPSY  09/29/2020   Procedure: BIOPSY;  Surgeon: Meridee Score Netty Starring., MD;  Location: [facility] ENDOSCOPY;  Service: Gastroenterology;;   CAROTID ARTERY SURGERY     Right    CHOLECYSTECTOMY N/A 09/26/2020   Procedure: LAPAROSCOPIC SUBTOTAL CHOLECYSTECTOMY;  Surgeon: Diamantina Monks, MD;  Location: [facility] OR;  Service: General;  Laterality: N/A;   CORONARY ARTERY BYPASS GRAFT     CABG (4 vessel, 2000.  No report in [place])   ENDOSCOPIC RETROGRADE CHOLANGIOPANCREATOGRAPHY (ERCP) WITH PROPOFOL N/A 09/29/2020   Procedure: ENDOSCOPIC RETROGRADE CHOLANGIOPANCREATOGRAPHY (ERCP) WITH PROPOFOL;  Surgeon: Meridee Score Netty Starring., MD;  Location: [facility] ENDOSCOPY;  Service: Gastroenterology;  Laterality: N/A;   ENDOSCOPIC RETROGRADE CHOLANGIOPANCREATOGRAPHY (ERCP)  WITH PROPOFOL N/A 12/15/2020   Procedure: ENDOSCOPIC RETROGRADE CHOLANGIOPANCREATOGRAPHY (ERCP) WITH PROPOFOL;  Surgeon: Meridee Score Netty Starring., MD;  Location: [facility] ENDOSCOPY;  Service: Gastroenterology;  Laterality: N/A;   ENDOSCOPIC RETROGRADE CHOLANGIOPANCREATOGRAPHY (ERCP) WITH PROPOFOL N/A 03/12/2021   Procedure: ENDOSCOPIC RETROGRADE CHOLANGIOPANCREATOGRAPHY (ERCP) WITH PROPOFOL;  Surgeon: Meridee Score Netty Starring., MD;  Location: [facility] ENDOSCOPY;  Service: Gastroenterology;  Laterality: N/A;   HEMOSTASIS CLIP PLACEMENT  12/15/2020   Procedure: HEMOSTASIS CLIP PLACEMENT;  Surgeon: Meridee Score Netty Starring., MD;  Location: [facility] ENDOSCOPY;  Service: Gastroenterology;;   MELANOMA EXCISION WITH SENTINEL LYMPH NODE BIOPSY Left 03/21/2020   Procedure: WIDE LOCAL EXCISION LEFT UPPER ARM MELANOMA ADVANCEMENT FLAP CLOSURE FOR DEFECT 4X8CM SENTINEL LYMPH NODE MAPPING AND BIOPSY;  Surgeon: Almond Lint, MD;  Location: [facility] OR;  Service: General;  Laterality: Left;   PANCREATIC STENT PLACEMENT  09/29/2020   Procedure: PANCREATIC STENT PLACEMENT;  Surgeon: Lemar Lofty., MD;  Location: [facility] ENDOSCOPY;  Service: Gastroenterology;;   REMOVAL OF STONES  09/29/2020   Procedure: REMOVAL OF STONES;  Surgeon: Lemar Lofty., MD;  Location: [facility] ENDOSCOPY;  Service: Gastroenterology;;   REMOVAL OF STONES  12/15/2020   Procedure: REMOVAL OF STONES;  Surgeon: Lemar Lofty., MD;  Location: [facility] ENDOSCOPY;  Service: Gastroenterology;;   REMOVAL OF STONES  03/12/2021   Procedure: REMOVAL OF SLUDGE;  Surgeon: Lemar Lofty., MD;  Location: [facility] ENDOSCOPY;  Service: Gastroenterology;;   RENAL ARTERY STENT  07/02/1999   RIGHT SIDE   SPHINCTEROTOMY  09/29/2020   Procedure: SPHINCTEROTOMY;  Surgeon: Mansouraty, Netty Starring., MD;  Location: [facility] ENDOSCOPY;  Service: Gastroenterology;;  STENT REMOVAL  12/15/2020   Procedure: STENT REMOVAL;  Surgeon: Lemar Lofty., MD;  Location: [facility] ENDOSCOPY;  Service:  Gastroenterology;;   Francine Graven REMOVAL  03/12/2021   Procedure: STENT REMOVAL;  Surgeon: Lemar Lofty., MD;  Location: [facility] ENDOSCOPY;  Service: Gastroenterology;;   HPI:  Juan Benjamin is a 89 y.o. male who presented to ED from home due to niece's concerns for generalized weakness, poor PO intake.  Prior medical history significant for AAA, AS, HTN, hypothyroidism.  Swallow evaluation reordered due to niece's concern for patient coughing during all of his meals.  Chest imaging concerning for pneumonia and prior CT 07/2022 showed Mild bilateral lower lobe and right middle lobe ground-glass  opacities and atelectasis may reflect pneumonia.  2. Bilateral lower lobe bronchiectasis likely reflects chronic  aspiration. Patient has had prior endoscopy that showed a hiatal hernia and gastritis in 2022.    Assessment / Plan / Recommendation  Clinical Impression  Patient today willing to participate in bedside swallow evaluation with niece, Clydie Braun, present.  He is edentulous and has dentures at home.  Most notably patient with severe xerostomia and white-tannish patches throughout oral cavity.  After extensive brushing swishing and expectorating with water per SLP instructions, he was able to clear approximately 90%.  However minimal amount of white coating was retained in the lateral sulcus, causing SLP to question if he may have oral candidiasis.  Clydie Braun reports patient had candidiasis when he had his gallbladder surgery.  Patient endorses discomfort of his gumline.  [name] was observed seeming a variety of textures including ice chips, thin liquid, nectar liquid, applesauce, and with some graham cracker.  Despite being edentulous he appeared to have rotary mastication with full oral clearance.  Liquids appear to be more problematic for this patient as he demonstrates multiple swallows and consistent coughing and wet voice after swallowing thin liquids.  Nectar thick liquids were tolerated did much  better and patient endorsed they were easier to swallow. SLP Visit Diagnosis: Dysphagia, unspecified (R13.10)    Aspiration Risk  Risk for inadequate nutrition/hydration;Moderate aspiration risk    Diet Recommendation Dysphagia 3 (Mech soft);Nectar-thick liquid Thin water between meals  Liquid Administration via: Cup;No straw Medication Administration: Whole meds with puree Supervision: Full supervision/cueing for compensatory strategies Compensations: Slow rate;Small sips/bites Postural Changes: Seated upright at 90 degrees;Remain upright for at least 30 minutes after po intake    Other  Recommendations Oral Care Recommendations: Oral care BID    Recommendations for follow up therapy are one component of a multi-disciplinary discharge planning process, led by the attending physician.  Recommendations may be updated based on patient status, additional functional criteria and insurance authorization.  Follow up Recommendations Follow physician's recommendations for discharge plan and follow up therapies      Assistance Recommended at Discharge    Functional Status Assessment Patient has had a recent decline in their functional status and demonstrates the ability to make significant improvements in function in a reasonable and predictable amount of time.  Frequency and Duration min 1 x/week  1 week       Prognosis Prognosis for improved oropharyngeal function: Fair Barriers to Reach Goals: Time post onset      Swallow Study   General HPI: JAXDEN BLYDEN is a 89 y.o. male who presented to ED from home due to niece's concerns for generalized weakness, poor PO intake.  Prior medical history significant for AAA, AS, HTN, hypothyroidism.  Swallow evaluation reordered due to niece's concern  for patient coughing during all of his meals.  Chest imaging concerning for pneumonia and prior CT 07/2022 showed Mild bilateral lower lobe and right middle lobe ground-glass  opacities and atelectasis  may reflect pneumonia.  2. Bilateral lower lobe bronchiectasis likely reflects chronic  aspiration. Patient has had prior endoscopy that showed a hiatal hernia and gastritis in 2022. Type of Study: Bedside Swallow Evaluation Previous Swallow Assessment: yes 4/30/202 Diet Prior to this Study: Dysphagia 3 (mechanical soft);Thin liquids (Level 0) Temperature Spikes Noted: No Respiratory Status: Room air History of Recent Intubation: No Behavior/Cognition: Alert;Cooperative;Confused Oral Cavity Assessment: Dried secretions;Excessive secretions;Erythema;Dry Oral Care Completed by SLP: Other (Comment) (Extensive oral care conducted by patient per SLP directions.  Remarkable improvement with condition of mouth and supraglottic pharynx after oral care) Oral Cavity - Dentition: Edentulous Vision: Functional for self-feeding Self-Feeding Abilities: Able to feed self Patient Positioning: Upright in bed Baseline Vocal Quality: Normal Volitional Cough: Strong (Productive cough) Volitional Swallow: Unable to elicit    Oral/Motor/Sensory Function Overall Oral Motor/Sensory Function: Within functional limits   Ice Chips Ice chips: Within functional limits Presentation: Spoon   Thin Liquid Thin Liquid: Impaired Presentation: Cup;Self Fed;Spoon Pharyngeal  Phase Impairments: Multiple swallows;Cough - Immediate;Cough - Delayed    Nectar Thick Nectar Thick Liquid: Impaired Presentation: Cup;Self Fed Pharyngeal Phase Impairments: Cough - Delayed;Multiple swallows   Honey Thick Honey Thick Liquid: Not tested   Puree Puree: Impaired Presentation: Self Fed;Spoon Pharyngeal Phase Impairments: Multiple swallows   Solid     Solid: Impaired Presentation: Self Fed Pharyngeal Phase Impairments: Multiple swallows Other Comments: Prolonged but adequate mastication.  Patient used applesauce to moisten solids.      Chales Abrahams 09/25/2022,11:34 AM  Rolena Infante, MS [facility] SLP Acute Rehab Services Office  [phone]

## 2022-09-25 NOTE — NC FL2 (Signed)
[employer] MEDICAID FL2 LEVEL OF CARE FORM     IDENTIFICATION  Patient Name: Juan Benjamin Birthdate: 02-Apr-1931 Sex: male Admission Date (Current Location): 09/23/2022  [facility] and [place] Number:  Producer, television/film/video and Address:  [facility],  [address] [place], [place] [address]      Provider Number: [id number]  Attending Physician Name and Address:  Marinda Elk, MD  Relative Name and Phone Number:  Herby Abraham [phone]    Current Level of Care: Hospital Recommended Level of Care: Skilled Nursing Facility Prior Approval Number:    Date Approved/Denied:   PASRR Number: [id number]  Discharge Plan: Hospital    Current Diagnoses: Patient Active Problem List   Diagnosis Date Noted   AAA (abdominal aortic aneurysm) (HCC) 09/24/2022   Generalized weakness 09/24/2022   Hypercalcemia 09/24/2022   Pressure injury of skin 09/24/2022   Leukocytosis 09/24/2022   CKD (chronic kidney disease) stage 4, GFR 15-29 ml/min (HCC) 09/24/2022   Protein-calorie malnutrition, severe 09/24/2022   AKI (acute kidney injury) (HCC) 09/24/2022   History of ERCP 11/03/2020   History of biliary stent insertion 11/03/2020   History of cholecystectomy 11/03/2020   Presence of pancreatic duct stent 11/03/2020   Change in bowel habits 11/03/2020   Aortic stenosis    Prostate cancer (HCC)    Pneumonia    Gallbladder sludge    Bile leak    Acute cholecystitis 09/27/2020   Abdominal pain 09/25/2020   New onset a-fib [facility]) 09/25/2020   Educated about COVID-19 virus infection 02/22/2020   Murmur 12/13/2010   Coronary artery disease    Hypertension    Carotid artery occlusion    Hyperlipidemia     Orientation RESPIRATION BLADDER Height & Weight     Self, Place  Normal Incontinent Weight: 135 lb 2.3 oz (61.3 kg) Height:  5\' 7"  (170.2 cm)  BEHAVIORAL SYMPTOMS/MOOD NEUROLOGICAL BOWEL NUTRITION STATUS      Continent Diet (See discharge summary)  AMBULATORY  STATUS COMMUNICATION OF NEEDS Skin   Limited Assist Verbally PU Stage and Appropriate Care (Buttocks Lower Stage 1) PU Stage 1 Dressing:  (PRN)                     Personal Care Assistance Level of Assistance  Bathing, Feeding, Dressing Bathing Assistance: Limited assistance Feeding assistance: Limited assistance Dressing Assistance: Limited assistance     Functional Limitations Info  Sight, Hearing, Speech Sight Info: Adequate Hearing Info: Impaired Speech Info: Adequate    SPECIAL CARE FACTORS FREQUENCY  PT (By licensed PT), OT (By licensed OT)     PT Frequency: 5x/wk OT Frequency: 5x/wk            Contractures Contractures Info: Not present    Additional Factors Info  Code Status, Allergies Code Status Info: DNR Allergies Info: Bee Venom, Wasp Venom, Crestor (Rosuvastatin), Gemfibrozil, Indomethacin, Lipitor (Atorvastatin), Lisinopril, Rofecoxib, Sildenafil, Simvastatin           Current Medications (09/25/2022):  This is the current hospital active medication list Current Facility-Administered Medications  Medication Dose Route Frequency Provider Last Rate Last Admin   acetaminophen (TYLENOL) tablet 650 mg  650 mg Oral Q6H PRN Hillary Bow, DO   650 mg at 09/24/22 2110   Or   acetaminophen (TYLENOL) suppository 650 mg  650 mg Rectal Q6H PRN Hillary Bow, DO       allopurinol (ZYLOPRIM) tablet 100 mg  100 mg Oral QHS Marinda Elk, MD  aspirin EC tablet 81 mg  81 mg Oral Daily Marinda Elk, MD       azithromycin [facility]) tablet 500 mg  500 mg Oral Q24H Osvaldo Shipper, MD   500 mg at 09/24/22 2109   cefTRIAXone (ROCEPHIN) 1 g in sodium chloride 0.9 % 100 mL IVPB  1 g Intravenous Q24H Osvaldo Shipper, MD 200 mL/hr at 09/25/22 0117 1 g at 09/25/22 0117   enoxaparin (LOVENOX) injection 30 mg  30 mg Subcutaneous Q24H Lyda Perone M, DO   30 mg at 09/24/22 1455   feeding supplement (ENSURE ENLIVE / ENSURE PLUS) liquid 237 mL  237 mL  Oral BID BM Osvaldo Shipper, MD   237 mL at 09/24/22 1456   food thickener (SIMPLYTHICK (NECTAR/LEVEL 2/MILDLY THICK)) 1 packet  1 packet Oral PRN Marinda Elk, MD       multivitamin with minerals tablet 1 tablet  1 tablet Oral Daily Osvaldo Shipper, MD   1 tablet at 09/24/22 1116   ondansetron (ZOFRAN) tablet 4 mg  4 mg Oral Q6H PRN Hillary Bow, DO       Or   ondansetron [facility]) injection 4 mg  4 mg Intravenous Q6H PRN Hillary Bow, DO       rosuvastatin (CRESTOR) tablet 10 mg  10 mg Oral QHS Marinda Elk, MD         Discharge Medications: Please see discharge summary for a list of discharge medications.  Relevant Imaging Results:  Relevant Lab Results:   Additional Information SSN: [id number]  Otelia Santee, LCSW

## 2022-09-25 NOTE — Progress Notes (Signed)
Speech Language Pathology Treatment: Dysphagia  Patient Details Name: Juan Benjamin MRN: [id number] DOB: 05-16-1931 Today's Date: 09/25/2022 Time: 1040-1100 SLP Time Calculation (min) (ACUTE ONLY): 20 min  Assessment / Plan / Recommendation Clinical Impression  Discussion commenced regarding patient's dysphagia and options/alternatives. Clydie Braun niece, indicates that this has been chronic but progressive in the last several months. She advises patient will not eat what has caused him to have problems swallowing in the past. During evaluation patient drank more nectar thick water than he did with thin causing SLP to question if this was due to improved swallow ability with this consistency. She also states every time he eats he coughs and patient reports more difficulties with liquids than solids. He however does not report any concern saying he coughs sometimes, causing SLP to suspect this may be near baseline for him. Patient with evident white coating in his mouth with trace remaining after extensive oral care causing SLP question if he may have oral candidiasis . SLP discussed with Clydie Braun option of of proceeding with instrumental swallow evaluation, i.e. MBS. Also advised suspicion that it would not change the patient's care plan. Suspect this dysphagia is more of a deconditioning and potentially cognitive related deficit resulting in worsening aspiration with liquids. Further discussed PEG tube not proving outcomes for patient is with cognitive visits over the age of 31, to which Clydie Braun reported appreciation for information and indicated this would not be pursued. At this time current plan is for patient to have nectar thick liquids with his meals and thin water between meals to help with hydration. Signs posted and orders written, SLP will follow-up next date.   ? Oral candidiasis?     HPI HPI: Juan Benjamin is a 89 y.o. male who presented to ED from home due to niece's concerns for generalized  weakness, poor PO intake.  Prior medical history significant for AAA, AS, HTN, hypothyroidism.  Swallow evaluation reordered due to niece's concern for patient coughing during all of his meals.  Chest imaging concerning for pneumonia and prior CT 07/2022 showed Mild bilateral lower lobe and right middle lobe ground-glass  opacities and atelectasis may reflect pneumonia.  2. Bilateral lower lobe bronchiectasis likely reflects chronic  aspiration. Patient has had prior endoscopy that showed a hiatal hernia and gastritis in 2022.      SLP Plan  Continue with current plan of care      Recommendations for follow up therapy are one component of a multi-disciplinary discharge planning process, led by the attending physician.  Recommendations may be updated based on patient status, additional functional criteria and insurance authorization.    Recommendations  Diet recommendations: Dysphagia 3 (mechanical soft);Nectar-thick liquid Liquids provided via: Cup Medication Administration: Whole meds with puree Supervision: Full supervision/cueing for compensatory strategies Compensations: Slow rate;Small sips/bites Postural Changes and/or Swallow Maneuvers: Seated upright 90 degrees;Upright 30-60 min after meal                  Oral care BID     Dysphagia, unspecified (R13.10);Dysphagia, oropharyngeal phase (R13.12)     Continue with current plan of care    Rolena Infante, MS [facility] SLP Acute Rehab Services Office [phone]  Chales Abrahams  09/25/2022, 11:47 AM

## 2022-09-25 NOTE — TOC Initial Note (Signed)
Transition of Care [facility]) - Initial/Assessment Note    Patient Details  Name: Juan Benjamin MRN: [id number] Date of Birth: 03/24/1931  Transition of Care [facility]) CM/SW Contact:    Otelia Santee, LCSW Phone Number: 09/25/2022, 11:42 AM  Clinical Narrative:                 Met with pt and pt's niece, Clydie Braun, to discuss discharge disposition. Clydie Braun is agreeable to recommendation for pt to transfer to [facility] at discharge. She shares that using the word 'rehab' vs skilled nursing facility may help pt to feel more comfortable with transferring to facility at discharge. She is hopeful pt will be able to progress while at SNF to be able to return home. She is not currently interested in pt going into LTC.  Pt's niece is interested in information for caregiver agencies and resources for pt once he returns home. Resources will be brought to room prior to pt being discharged.  Pt has not been to SNF in the past and pt's niece does not have a current preference for facility however, family have mentioned [address] and [address] as possibilities.  Referrals have been sent out for SNF placement and currently awaiting bed offers.    Expected Discharge Plan: Skilled Nursing Facility Barriers to Discharge: Continued Medical Work up, SNF Pending bed offer   Patient Goals and CMS Choice Patient states their goals for this hospitalization and ongoing recovery are:: For pt to transfer to SNF CMS Medicare.gov Compare Post Acute Care list provided to:: Patient Represenative (must comment) Choice offered to / list presented to : [facility] POA / Guardian [place] ownership interest in [facility].provided to:: [facility] POA / Guardian    Expected Discharge Plan and Services In-house Referral: Clinical Social Work Discharge Planning Services: NA Post Acute Care Choice: Skilled Nursing Facility Living arrangements for the past 2 months: Single Family Home                 DME Arranged: N/A DME  Agency: NA                  Prior Living Arrangements/Services Living arrangements for the past 2 months: Single Family Home Lives with:: Self Patient language and need for interpreter reviewed:: Yes Do you feel safe going back to the place where you live?: Yes      Need for Family Participation in Patient Care: Yes (Comment) Care giver support system in place?: No (comment) Current home services: DME Criminal Activity/Legal Involvement Pertinent to Current Situation/Hospitalization: No - Comment as needed  Activities of Daily Living Home Assistive Devices/Equipment: None ADL Screening (condition at time of admission) Patient's cognitive ability adequate to safely complete daily activities?: Yes Is the patient deaf or have difficulty hearing?: No Does the patient have difficulty seeing, even when wearing glasses/contacts?: No Does the patient have difficulty concentrating, remembering, or making decisions?: No Patient able to express need for assistance with ADLs?: Yes Does the patient have difficulty dressing or bathing?: No Independently performs ADLs?: Yes (appropriate for developmental age) Does the patient have difficulty walking or climbing stairs?: No Weakness of Legs: None Weakness of Arms/Hands: None  Permission Sought/Granted Permission sought to share information with : Family Supports, Oceanographer granted to share information with : Yes, Verbal Permission Granted  Share Information with NAME: Herby Abraham  Permission granted to share info w AGENCY: SNF's  Permission granted to share info w Relationship: Niece  Permission granted  to share info w Contact Information: [phone]  Emotional Assessment Appearance:: Appears stated age Attitude/Demeanor/Rapport: Unable to Assess Affect (typically observed): Unable to Assess Orientation: : Oriented to Self Alcohol / Substance Use: Not Applicable Psych Involvement: No  (comment)  Admission diagnosis:  Hypercalcemia [E83.52] Generalized weakness [R53.1] AKI (acute kidney injury) (HCC) [N17.9] Leukocytosis, unspecified type [D72.829] Community acquired pneumonia, unspecified laterality [J18.9] Abdominal aortic aneurysm (AAA) without rupture, unspecified part (HCC) [I71.40] Patient Active Problem List   Diagnosis Date Noted   AAA (abdominal aortic aneurysm) (HCC) 09/24/2022   Generalized weakness 09/24/2022   Hypercalcemia 09/24/2022   Pressure injury of skin 09/24/2022   Leukocytosis 09/24/2022   CKD (chronic kidney disease) stage 4, GFR 15-29 ml/min (HCC) 09/24/2022   Protein-calorie malnutrition, severe 09/24/2022   AKI (acute kidney injury) (HCC) 09/24/2022   History of ERCP 11/03/2020   History of biliary stent insertion 11/03/2020   History of cholecystectomy 11/03/2020   Presence of pancreatic duct stent 11/03/2020   Change in bowel habits 11/03/2020   Aortic stenosis    Prostate cancer (HCC)    Pneumonia    Gallbladder sludge    Bile leak    Acute cholecystitis 09/27/2020   Abdominal pain 09/25/2020   New onset a-fib [facility]) 09/25/2020   Educated about COVID-19 virus infection 02/22/2020   Murmur 12/13/2010   Coronary artery disease    Hypertension    Carotid artery occlusion    Hyperlipidemia    PCP:  Richmond Campbell., PA-C Pharmacy:   [phone] - [place], [place] - [address] [place] [address] Phone: [phone] Fax: [phone]  [facility] - [place], [place] - [address] [place] [address] Phone: [phone] Fax: [phone]     Social Determinants of Health (SDOH) Social History: SDOH Screenings   Food Insecurity: No Food Insecurity (09/24/2022)  Housing: Low Risk  (09/24/2022)  Transportation Needs: No Transportation Needs (09/24/2022)  Utilities: Not At Risk  (09/24/2022)  Tobacco Use: Medium Risk (09/24/2022)   SDOH Interventions: Food Insecurity Interventions: Intervention Not Indicated Housing Interventions: Intervention Not Indicated Transportation Interventions: Intervention Not Indicated Utilities Interventions: Intervention Not Indicated   Readmission Risk Interventions    09/25/2022   11:39 AM  Readmission Risk Prevention Plan  Transportation Screening Complete  PCP or Specialist Appt within 5-7 Days Complete  Home Care Screening Complete  Medication Review (RN CM) Complete

## 2022-09-26 DIAGNOSIS — N179 Acute kidney failure, unspecified: Secondary | ICD-10-CM | POA: Diagnosis not present

## 2022-09-26 DIAGNOSIS — R531 Weakness: Secondary | ICD-10-CM | POA: Diagnosis not present

## 2022-09-26 DIAGNOSIS — J189 Pneumonia, unspecified organism: Secondary | ICD-10-CM | POA: Diagnosis not present

## 2022-09-26 DIAGNOSIS — I714 Abdominal aortic aneurysm, without rupture, unspecified: Secondary | ICD-10-CM | POA: Diagnosis not present

## 2022-09-26 LAB — CBC WITH DIFFERENTIAL/PLATELET
Abs Immature Granulocytes: 0.17 10*3/uL — ABNORMAL HIGH (ref 0.00–0.07)
Basophils Absolute: 0.1 10*3/uL (ref 0.0–0.1)
Basophils Relative: 1 %
Eosinophils Absolute: 0.2 10*3/uL (ref 0.0–0.5)
Eosinophils Relative: 1 %
HCT: 41 % (ref 39.0–52.0)
Hemoglobin: 13.2 g/dL (ref 13.0–17.0)
Immature Granulocytes: 1 %
Lymphocytes Relative: 8 %
Lymphs Abs: 1.3 10*3/uL (ref 0.7–4.0)
MCH: 28.7 pg (ref 26.0–34.0)
MCHC: 32.2 g/dL (ref 30.0–36.0)
MCV: 89.1 fL (ref 80.0–100.0)
Monocytes Absolute: 1.4 10*3/uL — ABNORMAL HIGH (ref 0.1–1.0)
Monocytes Relative: 9 %
Neutro Abs: 13.4 10*3/uL — ABNORMAL HIGH (ref 1.7–7.7)
Neutrophils Relative %: 80 %
Platelets: 215 10*3/uL (ref 150–400)
RBC: 4.6 MIL/uL (ref 4.22–5.81)
RDW: 14.5 % (ref 11.5–15.5)
WBC: 16.5 10*3/uL — ABNORMAL HIGH (ref 4.0–10.5)
nRBC: 0 % (ref 0.0–0.2)

## 2022-09-26 LAB — BASIC METABOLIC PANEL
Anion gap: 10 (ref 5–15)
BUN: 33 mg/dL — ABNORMAL HIGH (ref 8–23)
CO2: 25 mmol/L (ref 22–32)
Calcium: 11.6 mg/dL — ABNORMAL HIGH (ref 8.9–10.3)
Chloride: 108 mmol/L (ref 98–111)
Creatinine, Ser: 1.93 mg/dL — ABNORMAL HIGH (ref 0.61–1.24)
GFR, Estimated: 33 mL/min — ABNORMAL LOW (ref >60)
Glucose, Bld: 96 mg/dL (ref 70–99)
Potassium: 3.1 mmol/L — ABNORMAL LOW (ref 3.5–5.1)
Sodium: 143 mmol/L (ref 135–145)

## 2022-09-26 LAB — PHOSPHORUS: Phosphorus: 2.9 mg/dL (ref 2.5–4.6)

## 2022-09-26 MED ORDER — PROPRANOLOL HCL 20 MG PO TABS
40.0000 mg | ORAL_TABLET | Freq: Every evening | ORAL | Status: DC
  Administered 2022-09-26 – 2022-09-29 (×4): 40 mg via ORAL
  Filled 2022-09-26 (×4): qty 2

## 2022-09-26 MED ORDER — FLUCONAZOLE 100MG IVPB
100.0000 mg | INTRAVENOUS | Status: DC
  Filled 2022-09-26: qty 50

## 2022-09-26 MED ORDER — SODIUM CHLORIDE 0.9 % IV SOLN
3.0000 g | Freq: Two times a day (BID) | INTRAVENOUS | Status: DC
  Administered 2022-09-26 (×2): 3 g via INTRAVENOUS
  Filled 2022-09-26 (×3): qty 8

## 2022-09-26 MED ORDER — FLUCONAZOLE IN SODIUM CHLORIDE 200-0.9 MG/100ML-% IV SOLN
200.0000 mg | Freq: Once | INTRAVENOUS | Status: AC
  Administered 2022-09-26: 200 mg via INTRAVENOUS
  Filled 2022-09-26: qty 100

## 2022-09-26 NOTE — Progress Notes (Signed)
[facility] PROGRESS NOTE    Progress Note  KWABENA STRUTZ  ZOX:[id number] DOB: January 19, 1931 DOA: 09/23/2022 PCP: Richmond Campbell., PA-C     Brief Narrative:   OMARIAN JAQUITH is an 89 y.o. male past medical history of AAA, aortic stenosis essential hypertension paroxysmal atrial fibrillation on anticoagulation vomiting due to generalized weakness with 3 days of diarrhea no abdominal pain  Assessment/Plan:   Generalized weakness question due to aspiration pneumonia. Procalcitonin is significantly elevated.  Continue IV Unasyn and azithromycin. Tmax 198.8, CBC with differential is pending this morning CT of the chest showed patchy groundglass opacities in the right lower lobe and some cavitary nodules Speech of related the patient recommended a dysphagia 3 diet he is moderate risk for aspiration PT recommended skilled nursing facility.  Leukocytosis: Question due to pneumonia higher than last month. Procalcitonin is elevated.  Oral thrush: Start oral Diflucan. Relates some dysphagia  Moderate aortic stenosis 2D echo showed moderate to severe aortic stenosis. 2D echo was unchanged from previous.  Hypercalcemia Vitamin D of 50 TSH of 0.9, vitamin D 50, PTH 32 and PTH related peptide are pending. Phosphorus is pending.  Chronic kidney disease stage IV: Seems to be at baseline.  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  Essential hypertension: Blood pressure is heart rate close to 100 restart propranolol.  Stage I sacral decubitus ulcer present on admission: Continue wound care. RN Pressure Injury Documentation: Pressure Injury 09/24/22 Buttocks Lower Stage 1 -  Intact skin with non-blanchable redness of a localized area usually over a bony prominence. redness (Active)  09/24/22 0136  Location: Buttocks  Location Orientation: Lower  Staging: Stage 1 -  Intact skin with non-blanchable redness of a localized area usually  over a bony prominence.  Wound Description (Comments): redness  Present on Admission: Yes  Dressing Type Foam - Lift dressing to assess site every shift 09/24/22 2030    DVT prophylaxis: lovenox Family Communication:niece Status is: Inpatient Remains inpatient appropriate because: Generalized weakness.    Code Status:     Code Status Orders  (From admission, onward)           Start     Ordered   09/24/22 0251  Full code  Continuous       Question:  By:  Answer:  Default: patient does not have capacity for decision making, no surrogate or prior directive available   09/24/22 0254           Code Status History     Date Active Date Inactive Code Status Order ID Comments User Context   09/25/2020 0314 10/04/2020 1802 Full Code [id number]  Hillary Bow, DO ED         IV Access:   Peripheral IV   Procedures and diagnostic studies:   CT CHEST WO CONTRAST  Result Date: 09/25/2022 CLINICAL DATA:  Weakness.  Respiratory illness.  Vomiting. EXAM: CT CHEST WITHOUT CONTRAST TECHNIQUE: Multidetector CT imaging of the chest was performed following the standard protocol without IV contrast. RADIATION DOSE REDUCTION: This exam was performed according to the departmental dose-optimization program which includes automated exposure control, adjustment of the mA and/or kV according to patient size and/or use of iterative reconstruction technique. COMPARISON:  08/10/2022 FINDINGS: Cardiovascular: Coronary, aortic arch, and branch vessel atherosclerotic vascular disease. Prior CABG. Chronically diminutive left common carotid artery. Mediastinum/Nodes: Unremarkable Lungs/Pleura: Centrilobular emphysema. Patchy ground-glass and airspace opacities in the right lower lobe. Cavitary nodular components including a  1.5 by 1.2 cm density on image 96 series 5 and a 1.2 by 1.0 cm similar focal nodular density with internal cavitation on image 105 series 5, roughly similar to 4/20 9/24 although more  blurred on that exam due to motion artifact. 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 Upper Abdomen: Pneumobilia. Stable appearance of the renal upper poles. Faint stranding around the gallbladder, cannot exclude gallbladder inflammation. Musculoskeletal: Mild thoracic spondylosis. IMPRESSION: 1. Patchy ground-glass and airspace opacities in the right lower lobe, with some cavitary nodular components. Appearance compatible with atypical infection and is roughly similar to 09/23/2022, but mostly new from 08/10/2022. 2. 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 3. Pneumobilia. 4. Faint stranding around the gallbladder, cannot exclude gallbladder inflammation. Correlate with any right upper quadrant abdominal symptoms. 5. Chronically diminutive left common carotid artery. 6. Coronary, aortic arch, and branch vessel atherosclerotic vascular disease. Prior CABG. 7. Emphysema. Aortic Atherosclerosis (ICD10-I70.0) and Emphysema (ICD10-J43.9). Electronically Signed   By: Gaylyn Rong M.D.   On: 09/25/2022 15:46   ECHOCARDIOGRAM COMPLETE  Result Date: 09/24/2022    ECHOCARDIOGRAM REPORT   Patient Name:   KENNAN DETTER Date of Exam: 09/24/2022 Medical Rec #:  [id number]        Height:       67.0 in Accession #:    [id number]       Weight:       135.1 lb Date of Birth:  02/27/1931        BSA:          1.712 m Patient Age:    89 years         BP:           126/62 mmHg Patient Gender: M                HR:           67 bpm. Exam Location:  Inpatient Procedure: 2D Echo, Color Doppler and Cardiac Doppler Indications:    Aortic stenosis  History:        Patient has prior history of Echocardiogram examinations. CAD;                 Risk Factors:Hypertension and Dyslipidemia.  Sonographer:    Mike Gip Referring Phys: [phone] JARED M GARDNER IMPRESSIONS  1. Left ventricular ejection fraction, by estimation, is 55%. The left ventricle has low normal  function. The left ventricle has no regional wall motion abnormalities. There is moderate asymmetric left ventricular hypertrophy of the septal segment. Indeterminate diastolic filling due to E-A fusion.  2. Right ventricular systolic function is normal. The right ventricular size is normal. There is normal pulmonary artery systolic pressure.  3. The mitral valve is grossly normal. No evidence of mitral valve regurgitation.  4. Suboptimal LVOT VTI assessment precludes accuate LV SV indexing, DVI assessment or continuity equation evaluation. Visual aortic valve appears calcified with reduced opening. Mean gradient 26 mm Hg with acceleration tim 111 ms. Suspect at least moderate to severe AS. The aortic valve is calcified. Aortic valve regurgitation is mild. Moderate to severe aortic valve stenosis. Aortic valve mean gradient measures 26.0 mmHg. Comparison(s): No significant change from prior study. FINDINGS  Left Ventricle: Left ventricular ejection fraction, by estimation, is 55%. The left ventricle has low normal function. The left ventricle has no regional wall motion abnormalities. The left ventricular internal cavity size was normal in size. There is moderate asymmetric left ventricular hypertrophy of the septal  segment. Indeterminate diastolic filling due to E-A fusion. Right Ventricle: The right ventricular size is normal. Right vetricular wall thickness was not well visualized. Right ventricular systolic function is normal. There is normal pulmonary artery systolic pressure. The tricuspid regurgitant velocity is 2.01 m/s, and with an assumed right atrial pressure of 3 mmHg, the estimated right ventricular systolic pressure is 19.2 mmHg. Left Atrium: Left atrial size was normal in size. Right Atrium: Right atrial size was normal in size. Pericardium: There is no evidence of pericardial effusion. Mitral Valve: The mitral valve is grossly normal. No evidence of mitral valve regurgitation. Tricuspid Valve: The  tricuspid valve is normal in structure. Tricuspid valve regurgitation is not demonstrated. Aortic Valve: Suboptimal LVOT VTI assessment precludes accuate LV SV indexing, DVI assessment or continuity equation evaluation. Visual aortic valve appears calcified with reduced opening. Mean gradient 26 mm Hg with acceleration tim 111 ms. Suspect at least moderate to severe AS. The aortic valve is calcified. Aortic valve regurgitation is mild. Aortic regurgitation PHT measures 587 msec. Moderate to severe aortic stenosis is present. Aortic valve mean gradient measures 26.0 mmHg. Aortic valve peak gradient measures 39.3 mmHg. Aortic valve area, by VTI measures 0.53 cm. Pulmonic Valve: The pulmonic valve was grossly normal. Pulmonic valve regurgitation is mild. Aorta: The aortic root and ascending aorta are structurally normal, with no evidence of dilitation. IAS/Shunts: The interatrial septum was not well visualized.  LEFT VENTRICLE PLAX 2D LVIDd:         3.80 cm     Diastology LVIDs:         2.80 cm     LV e' medial:    4.24 cm/s LV PW:         1.10 cm     LV E/e' medial:  18.2 LV IVS:        1.30 cm     LV e' lateral:   7.18 cm/s LVOT diam:     1.90 cm     LV E/e' lateral: 10.8 LV SV:         38 LV SV Index:   22 LVOT Area:     2.84 cm  LV Volumes (MOD) LV vol d, MOD A2C: 79.1 ml LV vol d, MOD A4C: 89.7 ml LV vol s, MOD A2C: 32.7 ml LV vol s, MOD A4C: 38.9 ml LV SV MOD A2C:     46.4 ml LV SV MOD A4C:     89.7 ml LV SV MOD BP:      53.2 ml RIGHT VENTRICLE            IVC RV Basal diam:  3.60 cm    IVC diam: 1.50 cm RV S prime:     7.72 cm/s TAPSE (M-mode): 1.2 cm LEFT ATRIUM             Index        RIGHT ATRIUM           Index LA diam:        3.50 cm 2.04 cm/m   RA Area:     18.30 cm LA Vol (A2C):   35.1 ml 20.50 ml/m  RA Volume:   48.00 ml  28.04 ml/m LA Vol (A4C):   34.7 ml 20.27 ml/m LA Biplane Vol: 37.2 ml 21.73 ml/m  AORTIC VALVE AV Area (Vmax):    0.62 cm AV Area (Vmean):   0.60 cm AV Area (VTI):     0.53  cm AV Vmax:  313.33 cm/s AV Vmean:          219.667 cm/s AV VTI:            0.716 m AV Peak Grad:      39.3 mmHg AV Mean Grad:      26.0 mmHg LVOT Vmax:         69.00 cm/s LVOT Vmean:        46.700 cm/s LVOT VTI:          0.133 m LVOT/AV VTI ratio: 0.19 AI PHT:            587 msec  AORTA Ao Root diam: 3.80 cm Ao Asc diam:  3.00 cm MITRAL VALVE               TRICUSPID VALVE MV Area (PHT): 6.60 cm    TR Peak grad:   16.2 mmHg MV Decel Time: 115 msec    TR Vmax:        201.00 cm/s MV E velocity: 77.25 cm/s MV A velocity: 99.90 cm/s  SHUNTS MV E/A ratio:  0.77        Systemic VTI:  0.13 m                            Systemic Diam: 1.90 cm Riley Lam MD Electronically signed by Riley Lam MD Signature Date/Time: 09/24/2022/12:35:20 PM    Final      Medical Consultants:   None.   Subjective:    JOSEGUADALUPE STAN relates he is he is about the same.  Objective:    Vitals:   09/25/22 0348 09/25/22 1340 09/25/22 2042 09/26/22 0403  BP: 134/87 (!) 142/70 108/74 (!) 135/92  Pulse: 75 85 62 91  Resp: 16 16 18 18   Temp: (!) 97.5 F (36.4 C) 97.9 F (36.6 C) 98.2 F (36.8 C) (!) 97.4 F (36.3 C)  TempSrc: Oral Oral Oral Oral  SpO2: 96% 95% 92% 97%  Weight:      Height:       SpO2: 97 %   Intake/Output Summary (Last 24 hours) at 09/26/2022 0814 Last data filed at 09/26/2022 0404 Gross per 24 hour  Intake 1100 ml  Output 550 ml  Net 550 ml    Filed Weights   09/24/22 0100  Weight: 61.3 kg    Exam: General exam: In no acute distress. Respiratory system: Good air movement and diffuse close mainly on the right Cardiovascular system: S1 & S2 heard, RRR. No JVD. Gastrointestinal system: Abdomen is nondistended, soft and nontender.  Extremities: No pedal edema. Skin: No rashes, lesions or ulcers Psychiatry: Judgement and insight appear normal. Mood & affect appropriate.   Data Reviewed:    Labs: Basic Metabolic Panel: Recent Labs  Lab 09/23/22 1854  09/24/22 0547 09/25/22 0531 09/25/22 0959  NA 140 143 140 140  K 4.0 3.9 3.0* 3.6  CL 104 108 107 107  CO2 24 24 22  21*  GLUCOSE 116* 99 90 104*  BUN 43* 40* 36* 35*  CREATININE 2.28* 2.05* 1.85* 1.92*  CALCIUM 11.7* 11.3* 11.2* 11.2*  PHOS  --   --   --  3.0    GFR Estimated Creatinine Clearance: 22.6 mL/min (A) (by C-G formula based on SCr of 1.92 mg/dL (H)). Liver Function Tests: Recent Labs  Lab 09/23/22 1854 09/25/22 0959  AST 21  --   ALT 9  --   ALKPHOS 76  --   BILITOT 1.3*  --  PROT 5.6*  --   ALBUMIN 2.5* 2.3*    No results for input(s): "LIPASE", "AMYLASE" in the last 168 hours. No results for input(s): "AMMONIA" in the last 168 hours. Coagulation profile No results for input(s): "INR", "PROTIME" in the last 168 hours. COVID-19 Labs  No results for input(s): "DDIMER", "FERRITIN", "LDH", "CRP" in the last 72 hours.  Lab Results  Component Value Date   SARSCOV2NAA NEGATIVE 08/10/2022   SARSCOV2NAA NEGATIVE 09/24/2020   SARSCOV2NAA NEGATIVE 03/17/2020    CBC: Recent Labs  Lab 09/23/22 1854 09/24/22 0547 09/25/22 0531  WBC 25.1* 23.1* 20.9*  NEUTROABS 21.4*  --  17.4*  HGB 13.8 14.3 13.1  HCT 41.9 43.7 40.1  MCV 88.0 88.6 87.7  PLT 195 192 191    Cardiac Enzymes: No results for input(s): "CKTOTAL", "CKMB", "CKMBINDEX", "TROPONINI" in the last 168 hours. BNP (last 3 results) No results for input(s): "PROBNP" in the last 8760 hours. CBG: No results for input(s): "GLUCAP" in the last 168 hours. D-Dimer: No results for input(s): "DDIMER" in the last 72 hours. Hgb A1c: No results for input(s): "HGBA1C" in the last 72 hours. Lipid Profile: No results for input(s): "CHOL", "HDL", "LDLCALC", "TRIG", "CHOLHDL", "LDLDIRECT" in the last 72 hours. Thyroid function studies: Recent Labs    09/24/22 0547  TSH 0.976    Anemia work up: No results for input(s): "VITAMINB12", "FOLATE", "FERRITIN", "TIBC", "IRON", "RETICCTPCT" in the last 72  hours. Sepsis Labs: Recent Labs  Lab 09/23/22 1854 09/23/22 2014 09/24/22 0547 09/25/22 0531  PROCALCITON  --   --  5.52 6.10  WBC 25.1*  --  23.1* 20.9*  LATICACIDVEN  --  1.2  --   --     Microbiology Recent Results (from the past 240 hour(s))  Culture, blood (Routine X 2) w Reflex to ID Panel     Status: None (Preliminary result)   Collection Time: 09/24/22 11:14 AM   Specimen: BLOOD  Result Value Ref Range Status   Specimen Description   Final    BLOOD BLOOD LEFT ARM AEROBIC BOTTLE ONLY Performed at [facility], [address] [place], [place] [address]    Special Requests   Final    BOTTLES DRAWN AEROBIC ONLY Blood Culture adequate volume Performed at [facility], [address] [place], [place] [address]    Culture   Final    NO GROWTH < 24 HOURS Performed at [facility], [address] [place], [place] [address]    Report Status PENDING  Incomplete  Culture, blood (Routine X 2) w Reflex to ID Panel     Status: None (Preliminary result)   Collection Time: 09/24/22 11:14 AM   Specimen: BLOOD  Result Value Ref Range Status   Specimen Description   Final    BLOOD BLOOD RIGHT ARM AEROBIC BOTTLE ONLY Performed at [facility], [address], [place], [place] [address]    Special Requests   Final    BOTTLES DRAWN AEROBIC ONLY Blood Culture adequate volume Performed at [facility], [address] [place], [place] [address]    Culture   Final    NO GROWTH < 24 HOURS Performed at [facility], [address], [place], [place] [address]    Report Status PENDING  Incomplete     Medications:    allopurinol  100 mg Oral QHS   aspirin EC  81 mg Oral Daily   azithromycin  500 mg  Oral Q24H   enoxaparin (LOVENOX) injection  30 mg Subcutaneous Q24H   feeding supplement  237 mL Oral BID BM   multivitamin with minerals  1 tablet Oral Daily   rosuvastatin  10 mg Oral QHS    Continuous Infusions:  cefTRIAXone (ROCEPHIN)  IV 1 g (09/25/22 2333)      LOS: 2 days   Marinda Elk  [facility]  09/26/2022, 8:14 AM

## 2022-09-26 NOTE — Progress Notes (Signed)
Physical Therapy Treatment Patient Details Name: Juan Benjamin MRN: [id number] DOB: [birth year] Today's Date: 09/26/2022   History of Present Illness Juan Benjamin is a 89 y.o. male admitted 09/23/22 with generalized weakness.  Niece visited patient, was concerned due to generalized weakness and debility so pt brought in to ED. Pt reported that he had some episodes of diarrhea, 3 episodes per day, no abdominal pain, feeling weaker and more fatigued but no vomiting. Pt reported decreased p.o. intake and that he feels lightheaded when he is standing. Pt with medical history including but not limited to  AAA, AS, HTN, hypothyroidism    PT Comments    Assisted pt to transfer from supine to sit, needed +1 hand held assist to sit EOB. Assisted pt to transfer to standing to ambulate in hallway 100 ft. Tactile and Vcs needed to steady for balance and safety navigating hallways. Pt needed assistance to manage walker as pt tended to lift walker and turn leaving it. Needed assistance to  manage walker to transfer to recliner in room. Pt is progressing with mobility with increased ambulated distance but still needs RW for balance and tendency to "get ahead of himself", HIGH FALL RISK. Pt was with family member at the beginning and end of the session. Pt plans to D/C to SNF.   Recommendations for follow up therapy are one component of a multi-disciplinary discharge planning process, led by the attending physician.  Recommendations may be updated based on patient status, additional functional criteria and insurance authorization.  Follow Up Recommendations  Can patient physically be transported by private vehicle: Yes    Assistance Recommended at Discharge Frequent or constant Supervision/Assistance  Patient can return home with the following A little help with walking and/or transfers;A little help with bathing/dressing/bathroom;Assistance with cooking/housework;Help with stairs or ramp for entrance    Equipment Recommendations  Rolling walker (2 wheels)    Recommendations for Other Services       Precautions / Restrictions Precautions Precautions: Fall Restrictions Weight Bearing Restrictions: No     Mobility  Bed Mobility Overal bed mobility: Needs Assistance Bed Mobility: Supine to Sit     Supine to sit: Min guard, Min assist     General bed mobility comments: needed hand held assist to sit up    Transfers Overall transfer level: Needs assistance Equipment used: Rolling walker (2 wheels) Transfers: Sit to/from Stand Sit to Stand: Min assist, Min guard   Step pivot transfers: Min assist       General transfer comment: Light min A to intiate and steady, Vc for safety    Ambulation/Gait Ambulation/Gait assistance: Min guard Gait Distance (Feet): 100 Feet Assistive device: Rolling walker (2 wheels) Gait Pattern/deviations: Decreased stride length, Step-through pattern Gait velocity: decreased     General Gait Details: Cues for RW proximity and min A to guide RW   Stairs             Wheelchair Mobility    Modified Rankin (Stroke Patients Only)       Balance                                            Cognition Arousal/Alertness: Awake/alert Behavior During Therapy: WFL for tasks assessed/performed Overall Cognitive Status: No family/caregiver present to determine baseline cognitive functioning Area of Impairment: Orientation, Memory, Safety/judgement, Awareness, Following commands, Problem solving  Orientation Level: Place, Situation, Time   Memory: Decreased short-term memory Following Commands: Follows one step commands with increased time Safety/Judgement: Decreased awareness of safety, Decreased awareness of deficits Awareness: Intellectual Problem Solving: Requires tactile cues, Requires verbal cues          Exercises      General Comments        Pertinent Vitals/Pain Pain  Assessment Pain Assessment: Faces Faces Pain Scale: No hurt Pain Intervention(s): Monitored during session, Repositioned    Home Living                          Prior Function            PT Goals (current goals can now be found in the care plan section) Acute Rehab PT Goals Patient Stated Goal: "go back to my room" (pt in room) PT Goal Formulation: Patient unable to participate in goal setting Time For Goal Achievement: 10/08/22 Potential to Achieve Goals: Fair Progress towards PT goals: Progressing toward goals    Frequency    Min 1X/week      PT Plan      Co-evaluation              AM-PAC PT "6 Clicks" Mobility   Outcome Measure  Help needed turning from your back to your side while in a flat bed without using bedrails?: A Little Help needed moving from lying on your back to sitting on the side of a flat bed without using bedrails?: A Little Help needed moving to and from a bed to a chair (including a wheelchair)?: A Lot Help needed standing up from a chair using your arms (e.g., wheelchair or bedside chair)?: A Lot Help needed to walk in hospital room?: A Lot Help needed climbing 3-5 steps with a railing? : A Lot 6 Click Score: 14    End of Session Equipment Utilized During Treatment: Gait belt Activity Tolerance: Patient tolerated treatment well Patient left: in chair;with call bell/phone within reach;with chair alarm set;with family/visitor present   PT Visit Diagnosis: Other abnormalities of gait and mobility (R26.89);Muscle weakness (generalized) (M62.81)     Time: 1610-9604 PT Time Calculation (min) (ACUTE ONLY): 18 min  Charges:  $Gait Training: 8-22 mins                     Sharlene Motts, [address]

## 2022-09-26 NOTE — TOC Progression Note (Signed)
Transition of Care [facility]) - Progression Note    Patient Details  Name: Juan Benjamin MRN: [id number] Date of Birth: 06-30-1930  Transition [facility] Care [facility] CM/SW Contact  Otelia Santee, LCSW Phone Number: 09/26/2022, 2:44 PM  Clinical Narrative:    Bed offers have been reviewed with pt's niece. Pt's niece reviewing SNF's prior to making decision. Currently awaiting response from [facility] for bed offer.    Expected Discharge Plan: Skilled Nursing Facility Barriers to Discharge: Continued Medical Work up, SNF Pending bed offer  Expected Discharge Plan and Services In-house Referral: Clinical Social Work Discharge Planning Services: NA Post Acute Care Choice: Skilled Nursing Facility Living arrangements for the past 2 months: Single Family Home                 DME Arranged: N/A DME Agency: NA                   Social Determinants of Health (SDOH) Interventions SDOH Screenings   Food Insecurity: No Food Insecurity (09/24/2022)  Housing: Low Risk  (09/24/2022)  Transportation Needs: No Transportation Needs (09/24/2022)  Utilities: Not At Risk (09/24/2022)  Tobacco Use: Medium Risk (09/25/2022)    Readmission Risk Interventions    09/25/2022   11:39 AM  Readmission Risk Prevention Plan  Transportation Screening Complete  PCP or Specialist Appt within 5-7 Days Complete  Home Care Screening Complete  Medication Review (RN CM) Complete

## 2022-09-27 DIAGNOSIS — N179 Acute kidney failure, unspecified: Secondary | ICD-10-CM | POA: Diagnosis not present

## 2022-09-27 DIAGNOSIS — I714 Abdominal aortic aneurysm, without rupture, unspecified: Secondary | ICD-10-CM | POA: Diagnosis not present

## 2022-09-27 DIAGNOSIS — J189 Pneumonia, unspecified organism: Secondary | ICD-10-CM | POA: Diagnosis not present

## 2022-09-27 DIAGNOSIS — R531 Weakness: Secondary | ICD-10-CM | POA: Diagnosis not present

## 2022-09-27 LAB — PTH, INTACT AND CALCIUM
Calcium, Total (PTH): 11.6 mg/dL — ABNORMAL HIGH (ref 8.6–10.2)
PTH: 17 pg/mL (ref 15–65)

## 2022-09-27 LAB — CULTURE, BLOOD (ROUTINE X 2)

## 2022-09-27 MED ORDER — POTASSIUM CHLORIDE 20 MEQ PO PACK
40.0000 meq | PACK | Freq: Two times a day (BID) | ORAL | Status: AC
  Administered 2022-09-27 (×2): 40 meq via ORAL
  Filled 2022-09-27 (×2): qty 2

## 2022-09-27 MED ORDER — FLUCONAZOLE 100 MG PO TABS
100.0000 mg | ORAL_TABLET | Freq: Every day | ORAL | Status: DC
  Administered 2022-09-27 – 2022-09-30 (×4): 100 mg via ORAL
  Filled 2022-09-27 (×4): qty 1

## 2022-09-27 MED ORDER — HALOPERIDOL LACTATE 5 MG/ML IJ SOLN
2.0000 mg | Freq: Once | INTRAMUSCULAR | Status: AC
  Administered 2022-09-27: 2 mg via INTRAVENOUS
  Filled 2022-09-27: qty 1

## 2022-09-27 MED ORDER — AMOXICILLIN-POT CLAVULANATE 400-57 MG/5ML PO SUSR
400.0000 mg | Freq: Two times a day (BID) | ORAL | Status: DC
  Administered 2022-09-27 – 2022-09-28 (×4): 400 mg via ORAL
  Filled 2022-09-27 (×5): qty 5

## 2022-09-27 MED ORDER — MELATONIN 3 MG PO TABS
3.0000 mg | ORAL_TABLET | Freq: Every day | ORAL | Status: DC
  Administered 2022-09-27 – 2022-09-28 (×2): 3 mg via ORAL
  Filled 2022-09-27 (×2): qty 1

## 2022-09-27 NOTE — Progress Notes (Signed)
Occupational Therapy Treatment Patient Details Name: Juan Benjamin MRN: [id number] DOB: [birth year] Today's Date: 09/27/2022   History of present illness Juan Benjamin is a 89 y.o. male admitted 09/23/22 with generalized weakness.  Niece visited patient, was concerned due to generalized weakness and debility so pt brought in to ED. Pt reported that he had some episodes of diarrhea, 3 episodes per day, no abdominal pain, feeling weaker and more fatigued but no vomiting. Pt reported decreased p.o. intake and that he feels lightheaded when he is standing. Pt with medical history including but not limited to  AAA, AS, HTN, hypothyroidism   OT comments  Patient with fair progress toward patient focused goals.  Better safety noted with RW, but cues continue to be needed to manage it safely.  Patient needing closer to Mod A for lower body ADL from a sit to stand level due to poor balance.  OT can continue to follow in the acute setting to address deficits, and Patient will benefit from continued inpatient follow up therapy, <3 hours/day.  Unclear what his ultimate disposition will be home with additional supports versus LTC.    Recommendations for follow up therapy are one component of a multi-disciplinary discharge planning process, led by the attending physician.  Recommendations may be updated based on patient status, additional functional criteria and insurance authorization.    Assistance Recommended at Discharge Frequent or constant Supervision/Assistance  Patient can return home with the following  A little help with bathing/dressing/bathroom;A little help with walking and/or transfers;Assistance with cooking/housework;Direct supervision/assist for financial management;Direct supervision/assist for medications management;Assist for transportation   Equipment Recommendations  None recommended by OT    Recommendations for Other Services      Precautions / Restrictions  Precautions Precautions: Fall Restrictions Weight Bearing Restrictions: No       Mobility Bed Mobility Overal bed mobility: Needs Assistance Bed Mobility: Supine to Sit, Sit to Supine     Supine to sit: Min assist Sit to supine: Min assist        Transfers Overall transfer level: Needs assistance Equipment used: Rolling walker (2 wheels) Transfers: Sit to/from Stand Sit to Stand: Min assist     Step pivot transfers: Min assist, Mod assist           Balance Overall balance assessment: Needs assistance Sitting-balance support: No upper extremity supported, Feet supported Sitting balance-Leahy Scale: Fair     Standing balance support: Reliant on assistive device for balance Standing balance-Leahy Scale: Poor Standing balance comment: one LOB needing assist to recover                           ADL either performed or assessed with clinical judgement   ADL       Grooming: Wash/dry hands;Min guard;Standing               Lower Body Dressing: Moderate assistance   Toilet Transfer: Minimal assistance;Ambulation;Rolling walker (2 wheels);Cueing for safety                  Extremity/Trunk Assessment Upper Extremity Assessment Upper Extremity Assessment: Generalized weakness   Lower Extremity Assessment Lower Extremity Assessment: Defer to PT evaluation   Cervical / Trunk Assessment Cervical / Trunk Assessment: Kyphotic    Vision   Vision Assessment?: No apparent visual deficits   Perception Perception Perception: Not tested   Praxis Praxis Praxis: Not tested    Cognition Arousal/Alertness: Awake/alert Behavior During Therapy: [facility] for  tasks assessed/performed Overall Cognitive Status: History of cognitive impairments - at baseline                                           Pertinent Vitals/ Pain       Pain Assessment Pain Assessment: Faces Faces Pain Scale: No hurt Pain Intervention(s): Monitored during  session                                                          Frequency  Min 2X/week        Progress Toward Goals  OT Goals(current goals can now be found in the care plan section)  Progress towards OT goals: Progressing toward goals  Acute Rehab OT Goals OT Goal Formulation: With patient Time For Goal Achievement: 10/08/22 Potential to Achieve Goals: Fair  Plan Discharge plan remains appropriate    Co-evaluation                 AM-PAC OT "6 Clicks" Daily Activity     Outcome Measure   Help from another person eating meals?: None Help from another person taking care of personal grooming?: A Little Help from another person toileting, which includes using toliet, bedpan, or urinal?: A Little Help from another person bathing (including washing, rinsing, drying)?: A Lot Help from another person to put on and taking off regular upper body clothing?: A Little Help from another person to put on and taking off regular lower body clothing?: A Lot 6 Click Score: 17    End of Session Equipment Utilized During Treatment: Gait belt;Rolling walker (2 wheels)  OT Visit Diagnosis: Unsteadiness on feet (R26.81);Muscle weakness (generalized) (M62.81);Other symptoms and signs involving cognitive function   Activity Tolerance Patient tolerated treatment well   Patient Left in bed;with call bell/phone within reach;with bed alarm set   Nurse Communication Mobility status        Time: 1610-9604 OT Time Calculation (min): 14 min  Charges: OT General Charges $OT Visit: 1 Visit OT Treatments $Self Care/Home Management : 8-22 mins  09/27/2022  RP, OTR/L  Acute Rehabilitation Services  Office:  [phone]   Suzanna Obey 09/27/2022, 2:22 PM

## 2022-09-27 NOTE — TOC Progression Note (Addendum)
Transition of Care [facility]) - Progression Note    Patient Details  Name: Juan Benjamin MRN: [id number] Date of Birth: 1931/05/13  Transition of Care [facility]) CM/SW Contact  Otelia Santee, LCSW Phone Number: 09/27/2022, 11:33 AM  Clinical Narrative:    Pt's niece has accepted bed offer for [facility]. Insurance Berkley Harvey has been requested through [employer]. Awaiting auth approval.   Update 1:40pm- Pt's insurance Berkley Harvey has been approved for SNF starting Monday 09/30/22. Auth ID: [id number].   Expected Discharge Plan: Skilled Nursing Facility Barriers to Discharge: Continued Medical Work up, SNF Pending bed offer  Expected Discharge Plan and Services In-house Referral: Clinical Social Work Discharge Planning Services: NA Post Acute Care Choice: Skilled Nursing Facility Living arrangements for the past 2 months: Single Family Home                 DME Arranged: N/A DME Agency: NA                   Social Determinants of Health (SDOH) Interventions SDOH Screenings   Food Insecurity: No Food Insecurity (09/24/2022)  Housing: Low Risk  (09/24/2022)  Transportation Needs: No Transportation Needs (09/24/2022)  Utilities: Not At Risk (09/24/2022)  Tobacco Use: Medium Risk (09/25/2022)    Readmission Risk Interventions    09/25/2022   11:39 AM  Readmission Risk Prevention Plan  Transportation Screening Complete  PCP or Specialist Appt within 5-7 Days Complete  Home Care Screening Complete  Medication Review (RN CM) Complete

## 2022-09-27 NOTE — Progress Notes (Signed)
Speech Language Pathology Treatment: Dysphagia  Patient Details Name: Juan Benjamin MRN: [id number] DOB: Dec 04, 1930 Today's Date: 09/27/2022 Time: 4098-1191 SLP Time Calculation (min) (ACUTE ONLY): 19 min  Assessment / Plan / Recommendation Clinical Impression  Pt seen for dysphagia management. He is fully alert with niece in the room with him but he is more confused today than 2 days prior. At times he seems to stare off. Clydie Braun, niece, reports pt ate some breakfast and consumed some Ensure. He is not drinking the potassium as Clydie Braun reports it "burns". Oral cavity clean today and pt with intelligible speech, although weak phonation and rapid rate. PO observation included Nectar thick soda, thin soda and water. Pt only wiling to consume 4 boluses total as Clydie Braun reports she has been pushing liquids this morning. Patient clinically tolerated any soda and nectar thick soda without any evaluation compromise after swallow of water via cup delayed mild coughing which may indicate airway infiltration. Swallow was overall timely and voice changes more respiratory changes observed but intake was very minimal. In discussion with Clydie Braun, dehydration risk is elevated with a patient displeasure with nectar thick liquid.   In light of this we will recommend to advance diet to allow thin liquids-preferably carbonated beverages with meals. Carbonated beverages may improve adequacy of laryngeal closure and timing of swallow, subsequently airway protection and patient will drink them). Again reviewed chronicity of relation dysphagia and that on patients with advanced age and dementia. Initiated discussion regarding comfort feeding with plan reported and gratitude for SLP involvement in his care plan.   Clydie Braun prefers patient to stay on dysphagia 3 diet given that he will not be able to order all of his meals. At this time SLP will sign off as evaluation, education and options discussed, completed to adequate understanding  per Clydie Braun. Thank you for allowing [place] to assist with this pt's care plan.     HPI HPI: Juan Benjamin is a 89 y.o. male who presented to ED from home due to niece's concerns for generalized weakness, poor PO intake.  Prior medical history significant for AAA, AS, HTN, hypothyroidism.  Swallow evaluation reordered due to niece's concern for patient coughing during all of his meals.  Chest imaging concerning for pneumonia and prior CT 07/2022 showed Mild bilateral lower lobe and right middle lobe ground-glass  opacities and atelectasis may reflect pneumonia.  2. Bilateral lower lobe bronchiectasis likely reflects chronic  aspiration. Patient has had prior endoscopy that showed a hiatal hernia and gastritis in 2022.      SLP Plan  All goals met      Recommendations for follow up therapy are one component of a multi-disciplinary discharge planning process, led by the attending physician.  Recommendations may be updated based on patient status, additional functional criteria and insurance authorization.    Recommendations  Diet recommendations: Dysphagia 3 (mechanical soft);Thin liquid Liquids provided via: Cup;Straw Medication Administration: Whole meds with puree (or with nectar) Supervision: Full supervision/cueing for compensatory strategies Compensations: Slow rate;Small sips/bites Postural Changes and/or Swallow Maneuvers: Seated upright 90 degrees;Upright 30-60 min after meal                  Oral care BID     Dysphagia, unspecified (R13.10);Dysphagia, oropharyngeal phase (R13.12)     All goals met    Rolena Infante, MS [facility] SLP Acute Rehab Services Office [phone]  Chales Abrahams  09/27/2022, 11:52 AM

## 2022-09-27 NOTE — Progress Notes (Signed)
[facility] PROGRESS NOTE    Progress Note  Juan Benjamin  VHQ:[id number] DOB: 1931/01/15 DOA: 09/23/2022 PCP: Richmond Campbell., PA-C     Brief Narrative:   Juan Benjamin is an 89 y.o. male past medical history of AAA, aortic stenosis essential hypertension paroxysmal atrial fibrillation on anticoagulation vomiting due to generalized weakness with 3 days of diarrhea no abdominal pain  Assessment/Plan:   Generalized weakness question due to aspiration pneumonia. Transition to oral Augmentin and azithromycin. Tmax 98.8, leukocytosis improving. Speech of related the patient recommended a dysphagia 3 diet he is moderate risk for aspiration PT recommended skilled nursing facility probably tomorrow or Monday  Leukocytosis: Likely due to pneumonia.  Oral thrush: Continue oral Diflucan.  Hypokalemia Replete orally recheck tomorrow morning.  Acute confusional state: Start melatonin at night.  Moderate aortic stenosis 2D echo showed moderate to severe aortic stenosis. 2D echo was unchanged from previous.  Hypercalcemia Vitamin D of 50 TSH of 0.9, vitamin D 50, PTH 32 and PTH related peptide are pending. Phosphorus is pending.  Chronic kidney disease stage IV: Seems to be at baseline.  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  Essential hypertension: Blood pressure is heart rate close to 100 restart propranolol.  Stage I sacral decubitus ulcer present on admission: Continue wound care. RN Pressure Injury Documentation: Pressure Injury 09/24/22 Buttocks Lower Stage 1 -  Intact skin with non-blanchable redness of a localized area usually over a bony prominence. redness (Active)  09/24/22 0136  Location: Buttocks  Location Orientation: Lower  Staging: Stage 1 -  Intact skin with non-blanchable redness of a localized area usually over a bony prominence.  Wound Description (Comments): redness  Present on Admission: Yes   Dressing Type Foam - Lift dressing to assess site every shift 09/26/22 1931    DVT prophylaxis: lovenox Family Communication:niece Status is: Inpatient Remains inpatient appropriate because: Generalized weakness.    Code Status:     Code Status Orders  (From admission, onward)           Start     Ordered   09/24/22 0251  Full code  Continuous       Question:  By:  Answer:  Default: patient does not have capacity for decision making, no surrogate or prior directive available   09/24/22 0254           Code Status History     Date Active Date Inactive Code Status Order ID Comments User Context   09/25/2020 0314 10/04/2020 1802 Full Code [id number]  Hillary Bow, DO ED         IV Access:   Peripheral IV   Procedures and diagnostic studies:   CT CHEST WO CONTRAST  Result Date: 09/25/2022 CLINICAL DATA:  Weakness.  Respiratory illness.  Vomiting. EXAM: CT CHEST WITHOUT CONTRAST TECHNIQUE: Multidetector CT imaging of the chest was performed following the standard protocol without IV contrast. RADIATION DOSE REDUCTION: This exam was performed according to the departmental dose-optimization program which includes automated exposure control, adjustment of the mA and/or kV according to patient size and/or use of iterative reconstruction technique. COMPARISON:  08/10/2022 FINDINGS: Cardiovascular: Coronary, aortic arch, and branch vessel atherosclerotic vascular disease. Prior CABG. Chronically diminutive left common carotid artery. Mediastinum/Nodes: Unremarkable Lungs/Pleura: Centrilobular emphysema. Patchy ground-glass and airspace opacities in the right lower lobe. Cavitary nodular components including a 1.5 by 1.2 cm density on image 96 series 5 and a 1.2 by 1.0 cm similar  focal nodular density with internal cavitation on image 105 series 5, roughly similar to 4/20 9/24 although more blurred on that exam due to motion artifact. 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 Upper Abdomen: Pneumobilia. Stable appearance of the renal upper poles. Faint stranding around the gallbladder, cannot exclude gallbladder inflammation. Musculoskeletal: Mild thoracic spondylosis. IMPRESSION: 1. Patchy ground-glass and airspace opacities in the right lower lobe, with some cavitary nodular components. Appearance compatible with atypical infection and is roughly similar to 09/23/2022, but mostly new from 08/10/2022. 2. 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 3. Pneumobilia. 4. Faint stranding around the gallbladder, cannot exclude gallbladder inflammation. Correlate with any right upper quadrant abdominal symptoms. 5. Chronically diminutive left common carotid artery. 6. Coronary, aortic arch, and branch vessel atherosclerotic vascular disease. Prior CABG. 7. Emphysema. Aortic Atherosclerosis (ICD10-I70.0) and Emphysema (ICD10-J43.9). Electronically Signed   By: Gaylyn Rong M.D.   On: 09/25/2022 15:46     Medical Consultants:   None.   Subjective:    Juan Benjamin no complaints except for coughing a lot.  Objective:    Vitals:   09/26/22 1335 09/26/22 1756 09/26/22 1933 09/27/22 0500  BP: 107/65 122/68 137/76 130/77  Pulse: 71 80 62 68  Resp: 16  16 18   Temp: 97.7 F (36.5 C)  98 F (36.7 C) 97.7 F (36.5 C)  TempSrc: Oral   Oral  SpO2: 96%  96% 98%  Weight:      Height:       SpO2: 98 %   Intake/Output Summary (Last 24 hours) at 09/27/2022 1610 Last data filed at 09/27/2022 0100 Gross per 24 hour  Intake 200 ml  Output 200 ml  Net 0 ml    Filed Weights   09/24/22 0100  Weight: 61.3 kg    Exam: General exam: In no acute distress. Respiratory system: Good air movement and clear to auscultation. Cardiovascular system: S1 & S2 heard, RRR. No JVD. Gastrointestinal system: Abdomen is nondistended, soft and nontender.  Extremities: No pedal edema. Skin: No rashes, lesions or  ulcers Psychiatry: Judgement and insight appear normal. Mood & affect appropriate. Data Reviewed:    Labs: Basic Metabolic Panel: Recent Labs  Lab 09/23/22 1854 09/24/22 0547 09/25/22 0531 09/25/22 0959 09/26/22 0609 09/26/22 0612  NA 140 143 140 140  --  143  K 4.0 3.9 3.0* 3.6  --  3.1*  CL 104 108 107 107  --  108  CO2 24 24 22  21*  --  25  GLUCOSE 116* 99 90 104*  --  96  BUN 43* 40* 36* 35*  --  33*  CREATININE 2.28* 2.05* 1.85* 1.92*  --  1.93*  CALCIUM 11.7* 11.3* 11.2* 11.2*  11.6*  --  11.6*  PHOS  --   --   --  3.0 2.9  --     GFR Estimated Creatinine Clearance: 22.5 mL/min (A) (by C-G formula based on SCr of 1.93 mg/dL (H)). Liver Function Tests: Recent Labs  Lab 09/23/22 1854 09/25/22 0959  AST 21  --   ALT 9  --   ALKPHOS 76  --   BILITOT 1.3*  --   PROT 5.6*  --   ALBUMIN 2.5* 2.3*    No results for input(s): "LIPASE", "AMYLASE" in the last 168 hours. No results for input(s): "AMMONIA" in the last 168 hours. Coagulation profile No results for input(s): "INR", "PROTIME" in the last 168 hours. COVID-19 Labs  No results for  input(s): "DDIMER", "FERRITIN", "LDH", "CRP" in the last 72 hours.  Lab Results  Component Value Date   SARSCOV2NAA NEGATIVE 08/10/2022   SARSCOV2NAA NEGATIVE 09/24/2020   SARSCOV2NAA NEGATIVE 03/17/2020    CBC: Recent Labs  Lab 09/23/22 1854 09/24/22 0547 09/25/22 0531 09/26/22 0609  WBC 25.1* 23.1* 20.9* 16.5*  NEUTROABS 21.4*  --  17.4* 13.4*  HGB 13.8 14.3 13.1 13.2  HCT 41.9 43.7 40.1 41.0  MCV 88.0 88.6 87.7 89.1  PLT 195 192 191 215    Cardiac Enzymes: No results for input(s): "CKTOTAL", "CKMB", "CKMBINDEX", "TROPONINI" in the last 168 hours. BNP (last 3 results) No results for input(s): "PROBNP" in the last 8760 hours. CBG: No results for input(s): "GLUCAP" in the last 168 hours. D-Dimer: No results for input(s): "DDIMER" in the last 72 hours. Hgb A1c: No results for input(s): "HGBA1C" in the last  72 hours. Lipid Profile: No results for input(s): "CHOL", "HDL", "LDLCALC", "TRIG", "CHOLHDL", "LDLDIRECT" in the last 72 hours. Thyroid function studies: No results for input(s): "TSH", "T4TOTAL", "T3FREE", "THYROIDAB" in the last 72 hours.  Invalid input(s): "FREET3"  Anemia work up: No results for input(s): "VITAMINB12", "FOLATE", "FERRITIN", "TIBC", "IRON", "RETICCTPCT" in the last 72 hours. Sepsis Labs: Recent Labs  Lab 09/23/22 1854 09/23/22 2014 09/24/22 0547 09/25/22 0531 09/26/22 0609  PROCALCITON  --   --  5.52 6.10  --   WBC 25.1*  --  23.1* 20.9* 16.5*  LATICACIDVEN  --  1.2  --   --   --     Microbiology Recent Results (from the past 240 hour(s))  Culture, blood (Routine X 2) w Reflex to ID Panel     Status: None (Preliminary result)   Collection Time: 09/24/22 11:14 AM   Specimen: BLOOD  Result Value Ref Range Status   Specimen Description   Final    BLOOD BLOOD LEFT ARM AEROBIC BOTTLE ONLY Performed at [facility], [address] [place], [place] [address]    Special Requests   Final    BOTTLES DRAWN AEROBIC ONLY Blood Culture adequate volume Performed at [facility], [address] [place], [place] [address]    Culture   Final    NO GROWTH 2 DAYS Performed at [facility], [address] [place], [place] [address]    Report Status PENDING  Incomplete  Culture, blood (Routine X 2) w Reflex to ID Panel     Status: None (Preliminary result)   Collection Time: 09/24/22 11:14 AM   Specimen: BLOOD  Result Value Ref Range Status   Specimen Description   Final    BLOOD BLOOD RIGHT ARM AEROBIC BOTTLE ONLY Performed at [facility], [address] [place], [place] [address]    Special Requests   Final    BOTTLES DRAWN AEROBIC ONLY Blood Culture adequate volume Performed at [facility], [address] [place], [place] [address]    Culture   Final    NO GROWTH 2  DAYS Performed at [facility], [address] [place], [place] [address]    Report Status PENDING  Incomplete     Medications:    allopurinol  100 mg Oral QHS   aspirin EC  81 mg Oral Daily   azithromycin  500 mg Oral Q24H   enoxaparin (LOVENOX) injection  30 mg Subcutaneous Q24H   feeding supplement  237 mL Oral BID BM   fluconazole  100 mg Oral Daily  multivitamin with minerals  1 tablet Oral Daily   propranolol  40 mg Oral QPM   rosuvastatin  10 mg Oral QHS   Continuous Infusions:  ampicillin-sulbactam (UNASYN) IV 3 g (09/26/22 2219)      LOS: 3 days   Marinda Elk  [facility]  09/27/2022, 9:22 AM

## 2022-09-27 NOTE — Progress Notes (Addendum)
    Patient Name: KIMO WALLO           DOB: March 18, 1931  MRN: [id number]      Admission Date: 09/23/2022  Attending Provider: Marinda Elk, MD  Primary Diagnosis: Generalized weakness   Level of care: Med-Surg    CROSS COVER NOTE   Date of Service   09/27/2022   BENNY TUMOLO, 89 y.o. male, was admitted on 09/23/2022 for Generalized weakness.    HPI/Events of Note   Notified by nursing staff that patient is agitated, restless, and confused.   Confusion is not new, but patient is now uncooperative and refusing to follow commands despite multiple attempts at redirection by nursing staff.  RN reports patient has been pulling medical equipment off and has attempted to get out of bed multiple times.  High fall risk.   Safety sitter has been recommended, however not available at this time.  Will trial Haldol for agitation.    Interventions/ Plan   IV Haldol        Anthoney Harada, DNP, ACNPC- AG [facility] [place]

## 2022-09-28 DIAGNOSIS — N179 Acute kidney failure, unspecified: Secondary | ICD-10-CM | POA: Diagnosis not present

## 2022-09-28 DIAGNOSIS — I714 Abdominal aortic aneurysm, without rupture, unspecified: Secondary | ICD-10-CM | POA: Diagnosis not present

## 2022-09-28 DIAGNOSIS — R531 Weakness: Secondary | ICD-10-CM | POA: Diagnosis not present

## 2022-09-28 DIAGNOSIS — J189 Pneumonia, unspecified organism: Secondary | ICD-10-CM | POA: Diagnosis not present

## 2022-09-28 LAB — CULTURE, BLOOD (ROUTINE X 2): Culture: NO GROWTH

## 2022-09-28 LAB — BASIC METABOLIC PANEL
Anion gap: 11 (ref 5–15)
BUN: 29 mg/dL — ABNORMAL HIGH (ref 8–23)
CO2: 23 mmol/L (ref 22–32)
Calcium: 11.2 mg/dL — ABNORMAL HIGH (ref 8.9–10.3)
Chloride: 109 mmol/L (ref 98–111)
Creatinine, Ser: 1.82 mg/dL — ABNORMAL HIGH (ref 0.61–1.24)
GFR, Estimated: 35 mL/min — ABNORMAL LOW (ref >60)
Glucose, Bld: 83 mg/dL (ref 70–99)
Potassium: 3.4 mmol/L — ABNORMAL LOW (ref 3.5–5.1)
Sodium: 143 mmol/L (ref 135–145)

## 2022-09-28 MED ORDER — SODIUM CHLORIDE 0.9 % IV SOLN: INTRAVENOUS | Status: AC

## 2022-09-28 MED ORDER — GERHARDT'S BUTT CREAM
TOPICAL_CREAM | Freq: Three times a day (TID) | CUTANEOUS | Status: DC
  Filled 2022-09-28: qty 1

## 2022-09-28 MED ORDER — POTASSIUM CHLORIDE 20 MEQ PO PACK
40.0000 meq | PACK | Freq: Two times a day (BID) | ORAL | Status: AC
  Administered 2022-09-28 (×2): 40 meq via ORAL
  Filled 2022-09-28 (×2): qty 2

## 2022-09-28 NOTE — Progress Notes (Signed)
[facility] PROGRESS NOTE    Progress Note  Juan Benjamin  ZOX:[id number] DOB: 03/24/1931 DOA: 09/23/2022 PCP: Richmond Campbell., PA-C     Brief Narrative:   Juan Benjamin is an 89 y.o. male past medical history of AAA, aortic stenosis essential hypertension paroxysmal atrial fibrillation on anticoagulation vomiting due to generalized weakness with 3 days of diarrhea no abdominal pain  Assessment/Plan:   Generalized weakness question due to aspiration pneumonia. Transition to oral Augmentin and azithromycin. Tmax 98.8, leukocytosis improving. Speech of related the patient recommended a dysphagia 3 diet he is moderate risk for aspiration PT recommended skilled nursing facility probably tomorrow or Monday. Out of bed to chair, turn patient every 2 hours.  Leukocytosis: Likely due to pneumonia.  Oral thrush: Continue oral Diflucan.  Hypokalemia Replete orally basic metabolic panels pending this morning.  Acute confusional state: Start melatonin at night.  Moderate aortic stenosis 2D echo showed moderate to severe aortic stenosis. 2D echo was unchanged from previous.  Hypercalcemia: Vitamin D of 50 TSH of 0.9, vitamin D 50, PTH 32 and PTH related peptide are pending. Phosphorus unremarkable. Will need to follow-up with PCP as an outpatient.  Repeat basic metabolic panel at this time. Question due to dehydration.  Chronic kidney disease stage IV: Seems to be at baseline.  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  Essential hypertension: Blood pressure is heart rate close to 100 restart propranolol.  Stage I sacral decubitus ulcer present on admission: Continue wound care. RN Pressure Injury Documentation: Pressure Injury 09/24/22 Buttocks Lower Stage 1 -  Intact skin with non-blanchable redness of a localized area usually over a bony prominence. redness (Active)  09/24/22 0136  Location: Buttocks  Location  Orientation: Lower  Staging: Stage 1 -  Intact skin with non-blanchable redness of a localized area usually over a bony prominence.  Wound Description (Comments): redness  Present on Admission: Yes  Dressing Type Foam - Lift dressing to assess site every shift 09/27/22 2000    DVT prophylaxis: lovenox Family Communication:niece Status is: Inpatient Remains inpatient appropriate because: Generalized weakness.    Code Status:     Code Status Orders  (From admission, onward)           Start     Ordered   09/24/22 0251  Full code  Continuous       Question:  By:  Answer:  Default: patient does not have capacity for decision making, no surrogate or prior directive available   09/24/22 0254           Code Status History     Date Active Date Inactive Code Status Order ID Comments User Context   09/25/2020 0314 10/04/2020 1802 Full Code [id number]  Hillary Bow, DO ED         IV Access:   Peripheral IV   Procedures and diagnostic studies:   No results found.   Medical Consultants:   None.   Subjective:    Juan Benjamin tolerate his diet this morning feels better.  Objective:    Vitals:   09/27/22 0500 09/27/22 1404 09/27/22 1941 09/28/22 0421  BP: 130/77 107/64 (!) 149/72 127/65  Pulse: 68 64 (!) 58 (!) 55  Resp: 18 (!) 22 18 16   Temp: 97.7 F (36.5 C) 98.2 F (36.8 C) 98.1 F (36.7 C) 98 F (36.7 C)  TempSrc: Oral Oral    SpO2: 98% 94% 95% 90%  Weight:  Height:       SpO2: 90 %   Intake/Output Summary (Last 24 hours) at 09/28/2022 0917 Last data filed at 09/27/2022 1000 Gross per 24 hour  Intake 120 ml  Output --  Net 120 ml    Filed Weights   09/24/22 0100  Weight: 61.3 kg    Exam: General exam: In no acute distress. Respiratory system: Good air movement and clear to auscultation. Cardiovascular system: S1 & S2 heard, RRR. No JVD. Gastrointestinal system: Abdomen is nondistended, soft and nontender.  Extremities: No  pedal edema. Skin: Sacral decubitus ulcer pressure ulcer Psychiatry: Judgement and insight appear normal. Mood & affect appropriate. Data Reviewed:    Labs: Basic Metabolic Panel: Recent Labs  Lab 09/23/22 1854 09/24/22 0547 09/25/22 0531 09/25/22 0959 09/26/22 0609 09/26/22 0612  NA 140 143 140 140  --  143  K 4.0 3.9 3.0* 3.6  --  3.1*  CL 104 108 107 107  --  108  CO2 24 24 22  21*  --  25  GLUCOSE 116* 99 90 104*  --  96  BUN 43* 40* 36* 35*  --  33*  CREATININE 2.28* 2.05* 1.85* 1.92*  --  1.93*  CALCIUM 11.7* 11.3* 11.2* 11.2*  11.6*  --  11.6*  PHOS  --   --   --  3.0 2.9  --     GFR Estimated Creatinine Clearance: 22.5 mL/min (A) (by C-G formula based on SCr of 1.93 mg/dL (H)). Liver Function Tests: Recent Labs  Lab 09/23/22 1854 09/25/22 0959  AST 21  --   ALT 9  --   ALKPHOS 76  --   BILITOT 1.3*  --   PROT 5.6*  --   ALBUMIN 2.5* 2.3*    No results for input(s): "LIPASE", "AMYLASE" in the last 168 hours. No results for input(s): "AMMONIA" in the last 168 hours. Coagulation profile No results for input(s): "INR", "PROTIME" in the last 168 hours. COVID-19 Labs  No results for input(s): "DDIMER", "FERRITIN", "LDH", "CRP" in the last 72 hours.  Lab Results  Component Value Date   SARSCOV2NAA NEGATIVE 08/10/2022   SARSCOV2NAA NEGATIVE 09/24/2020   SARSCOV2NAA NEGATIVE 03/17/2020    CBC: Recent Labs  Lab 09/23/22 1854 09/24/22 0547 09/25/22 0531 09/26/22 0609  WBC 25.1* 23.1* 20.9* 16.5*  NEUTROABS 21.4*  --  17.4* 13.4*  HGB 13.8 14.3 13.1 13.2  HCT 41.9 43.7 40.1 41.0  MCV 88.0 88.6 87.7 89.1  PLT 195 192 191 215    Cardiac Enzymes: No results for input(s): "CKTOTAL", "CKMB", "CKMBINDEX", "TROPONINI" in the last 168 hours. BNP (last 3 results) No results for input(s): "PROBNP" in the last 8760 hours. CBG: No results for input(s): "GLUCAP" in the last 168 hours. D-Dimer: No results for input(s): "DDIMER" in the last 72 hours. Hgb  A1c: No results for input(s): "HGBA1C" in the last 72 hours. Lipid Profile: No results for input(s): "CHOL", "HDL", "LDLCALC", "TRIG", "CHOLHDL", "LDLDIRECT" in the last 72 hours. Thyroid function studies: No results for input(s): "TSH", "T4TOTAL", "T3FREE", "THYROIDAB" in the last 72 hours.  Invalid input(s): "FREET3"  Anemia work up: No results for input(s): "VITAMINB12", "FOLATE", "FERRITIN", "TIBC", "IRON", "RETICCTPCT" in the last 72 hours. Sepsis Labs: Recent Labs  Lab 09/23/22 1854 09/23/22 2014 09/24/22 0547 09/25/22 0531 09/26/22 0609  PROCALCITON  --   --  5.52 6.10  --   WBC 25.1*  --  23.1* 20.9* 16.5*  LATICACIDVEN  --  1.2  --   --   --  Microbiology Recent Results (from the past 240 hour(s))  Culture, blood (Routine X 2) w Reflex to ID Panel     Status: None (Preliminary result)   Collection Time: 09/24/22 11:14 AM   Specimen: BLOOD  Result Value Ref Range Status   Specimen Description   Final    BLOOD BLOOD LEFT ARM AEROBIC BOTTLE ONLY Performed at [facility], [address] [place], [place] [address]    Special Requests   Final    BOTTLES DRAWN AEROBIC ONLY Blood Culture adequate volume Performed at [facility], [address] [place], [place] [address]    Culture   Final    NO GROWTH 3 DAYS Performed at [facility], [address], [place], [place] [address]    Report Status PENDING  Incomplete  Culture, blood (Routine X 2) w Reflex to ID Panel     Status: None (Preliminary result)   Collection Time: 09/24/22 11:14 AM   Specimen: BLOOD  Result Value Ref Range Status   Specimen Description   Final    BLOOD BLOOD RIGHT ARM AEROBIC BOTTLE ONLY Performed at [facility], [address], [place], [place] [address]    Special Requests   Final    BOTTLES DRAWN AEROBIC ONLY Blood Culture adequate volume Performed at [facility], [address], [place],  [place] [address]    Culture   Final    NO GROWTH 3 DAYS Performed at [facility], [address], [place], [place] [address]    Report Status PENDING  Incomplete     Medications:    allopurinol  100 mg Oral QHS   amoxicillin-clavulanate  400 mg Oral Q12H   aspirin EC  81 mg Oral Daily   enoxaparin (LOVENOX) injection  30 mg Subcutaneous Q24H   feeding supplement  237 mL Oral BID BM   fluconazole  100 mg Oral Daily   melatonin  3 mg Oral QHS   multivitamin with minerals  1 tablet Oral Daily   propranolol  40 mg Oral QPM   rosuvastatin  10 mg Oral QHS   Continuous Infusions:      LOS: 4 days   Marinda Elk  [facility]  09/28/2022, 9:17 AM

## 2022-09-29 DIAGNOSIS — I714 Abdominal aortic aneurysm, without rupture, unspecified: Secondary | ICD-10-CM | POA: Diagnosis not present

## 2022-09-29 DIAGNOSIS — J189 Pneumonia, unspecified organism: Secondary | ICD-10-CM | POA: Diagnosis not present

## 2022-09-29 DIAGNOSIS — R531 Weakness: Secondary | ICD-10-CM | POA: Diagnosis not present

## 2022-09-29 DIAGNOSIS — N179 Acute kidney failure, unspecified: Secondary | ICD-10-CM | POA: Diagnosis not present

## 2022-09-29 LAB — CULTURE, BLOOD (ROUTINE X 2)
Culture: NO GROWTH
Special Requests: ADEQUATE
Special Requests: ADEQUATE

## 2022-09-29 MED ORDER — POTASSIUM CHLORIDE 20 MEQ PO PACK
40.0000 meq | PACK | Freq: Three times a day (TID) | ORAL | Status: AC
  Administered 2022-09-29: 40 meq via ORAL
  Filled 2022-09-29 (×2): qty 2

## 2022-09-29 MED ORDER — AMOXICILLIN-POT CLAVULANATE 400-57 MG/5ML PO SUSR
400.0000 mg | Freq: Two times a day (BID) | ORAL | Status: AC
  Administered 2022-09-29 (×2): 400 mg via ORAL
  Filled 2022-09-29 (×2): qty 5

## 2022-09-29 NOTE — Progress Notes (Signed)
[facility] PROGRESS NOTE    Progress Note  Juan Benjamin  YQM:[id number] DOB: May 05, 1931 DOA: 09/23/2022 PCP: Richmond Campbell., PA-C     Brief Narrative:   Juan Benjamin is an 89 y.o. male past medical history of AAA, aortic stenosis essential hypertension paroxysmal atrial fibrillation on anticoagulation vomiting due to generalized weakness with 3 days of diarrhea no abdominal pain  Assessment/Plan:   Generalized weakness question due to aspiration pneumonia. He will complete his antibiotic regimen in house. He has remained afebrile. Speech evaluated the patient recommended dysphagia 3 diet. Physical therapy evaluated the patient, he will need to go to skilled nursing facility hopefully on 09/30/2022. Out of bed to chair, turn patient every 2 hours. Family agreed to meet with palliative care at facility.  Leukocytosis: Likely due to pneumonia.  Oral thrush: Continue oral Diflucan total of 7 days.  Hypokalemia Potassium still low replete orally.  Acute confusional state: Start melatonin at night.  No events overnight after starting melatonin.  Moderate aortic stenosis 2D echo showed moderate to severe aortic stenosis. 2D echo was unchanged from previous.  Hypercalcemia: Vitamin D of 50 TSH of 0.9, vitamin D 50, PTH 32 and PTH related peptide are pending. Phosphorus unremarkable. Will need to follow-up with PCP as an outpatient.  Repeat basic metabolic panel at this time. Question due to dehydration.  Chronic kidney disease stage IV: Seems to be at baseline.  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  Essential hypertension: Blood pressure is heart rate close to 100 restart propranolol.  Stage I sacral decubitus ulcer present on admission: Continue wound care. RN Pressure Injury Documentation: Pressure Injury 09/24/22 Buttocks Lower Stage 1 -  Intact skin with non-blanchable redness of a localized area usually  over a bony prominence. redness (Active)  09/24/22 0136  Location: Buttocks  Location Orientation: Lower  Staging: Stage 1 -  Intact skin with non-blanchable redness of a localized area usually over a bony prominence.  Wound Description (Comments): redness  Present on Admission: Yes  Dressing Type Foam - Lift dressing to assess site every shift 09/27/22 2000    DVT prophylaxis: lovenox Family Communication:niece Status is: Inpatient Remains inpatient appropriate because: Generalized weakness.    Code Status:     Code Status Orders  (From admission, onward)           Start     Ordered   09/24/22 0251  Full code  Continuous       Question:  By:  Answer:  Default: patient does not have capacity for decision making, no surrogate or prior directive available   09/24/22 0254           Code Status History     Date Active Date Inactive Code Status Order ID Comments User Context   09/25/2020 0314 10/04/2020 1802 Full Code [id number]  Hillary Bow, DO ED         IV Access:   Peripheral IV   Procedures and diagnostic studies:   No results found.   Medical Consultants:   None.   Subjective:    Juan Benjamin no new complaints this morning.  Objective:    Vitals:   09/28/22 0421 09/28/22 1208 09/28/22 2032 09/29/22 0438  BP: 127/65 125/69 (!) 146/78 (!) 136/91  Pulse: (!) 55 (!) 55 (!) 53 (!) 54  Resp: 16 18 18 18   Temp: 98 F (36.7 C) (!) 97.3 F (36.3 C) (!) 97.3 F (36.3  C) 97.6 F (36.4 C)  TempSrc:  Oral Oral   SpO2: 90% 90% 91% 94%  Weight:      Height:       SpO2: 94 %   Intake/Output Summary (Last 24 hours) at 09/29/2022 0908 Last data filed at 09/28/2022 1815 Gross per 24 hour  Intake 387.61 ml  Output 20 ml  Net 367.61 ml    Filed Weights   09/24/22 0100  Weight: 61.3 kg    Exam: General exam: In no acute distress. Respiratory system: Good air movement and clear to auscultation. Cardiovascular system: S1 & S2 heard,  RRR. No JVD. Gastrointestinal system: Abdomen is nondistended, soft and nontender.  Extremities: No pedal edema. Skin: Sacral decubitus ulcer pressure ulcer Psychiatry: Judgement and insight appear normal. Mood & affect appropriate. Data Reviewed:    Labs: Basic Metabolic Panel: Recent Labs  Lab 09/24/22 0547 09/25/22 0531 09/25/22 0959 09/26/22 0609 09/26/22 0612 09/28/22 1134  NA 143 140 140  --  143 143  K 3.9 3.0* 3.6  --  3.1* 3.4*  CL 108 107 107  --  108 109  CO2 24 22 21*  --  25 23  GLUCOSE 99 90 104*  --  96 83  BUN 40* 36* 35*  --  33* 29*  CREATININE 2.05* 1.85* 1.92*  --  1.93* 1.82*  CALCIUM 11.3* 11.2* 11.2*  11.6*  --  11.6* 11.2*  PHOS  --   --  3.0 2.9  --   --     GFR Estimated Creatinine Clearance: 23.9 mL/min (A) (by C-G formula based on SCr of 1.82 mg/dL (H)). Liver Function Tests: Recent Labs  Lab 09/23/22 1854 09/25/22 0959  AST 21  --   ALT 9  --   ALKPHOS 76  --   BILITOT 1.3*  --   PROT 5.6*  --   ALBUMIN 2.5* 2.3*    No results for input(s): "LIPASE", "AMYLASE" in the last 168 hours. No results for input(s): "AMMONIA" in the last 168 hours. Coagulation profile No results for input(s): "INR", "PROTIME" in the last 168 hours. COVID-19 Labs  No results for input(s): "DDIMER", "FERRITIN", "LDH", "CRP" in the last 72 hours.  Lab Results  Component Value Date   SARSCOV2NAA NEGATIVE 08/10/2022   SARSCOV2NAA NEGATIVE 09/24/2020   SARSCOV2NAA NEGATIVE 03/17/2020    CBC: Recent Labs  Lab 09/23/22 1854 09/24/22 0547 09/25/22 0531 09/26/22 0609  WBC 25.1* 23.1* 20.9* 16.5*  NEUTROABS 21.4*  --  17.4* 13.4*  HGB 13.8 14.3 13.1 13.2  HCT 41.9 43.7 40.1 41.0  MCV 88.0 88.6 87.7 89.1  PLT 195 192 191 215    Cardiac Enzymes: No results for input(s): "CKTOTAL", "CKMB", "CKMBINDEX", "TROPONINI" in the last 168 hours. BNP (last 3 results) No results for input(s): "PROBNP" in the last 8760 hours. CBG: No results for input(s):  "GLUCAP" in the last 168 hours. D-Dimer: No results for input(s): "DDIMER" in the last 72 hours. Hgb A1c: No results for input(s): "HGBA1C" in the last 72 hours. Lipid Profile: No results for input(s): "CHOL", "HDL", "LDLCALC", "TRIG", "CHOLHDL", "LDLDIRECT" in the last 72 hours. Thyroid function studies: No results for input(s): "TSH", "T4TOTAL", "T3FREE", "THYROIDAB" in the last 72 hours.  Invalid input(s): "FREET3"  Anemia work up: No results for input(s): "VITAMINB12", "FOLATE", "FERRITIN", "TIBC", "IRON", "RETICCTPCT" in the last 72 hours. Sepsis Labs: Recent Labs  Lab 09/23/22 1854 09/23/22 2014 09/24/22 0547 09/25/22 0531 09/26/22 1610  PROCALCITON  --   --  5.52 6.10  --   WBC 25.1*  --  23.1* 20.9* 16.5*  LATICACIDVEN  --  1.2  --   --   --     Microbiology Recent Results (from the past 240 hour(s))  Culture, blood (Routine X 2) w Reflex to ID Panel     Status: None (Preliminary result)   Collection Time: 09/24/22 11:14 AM   Specimen: BLOOD  Result Value Ref Range Status   Specimen Description   Final    BLOOD BLOOD LEFT ARM AEROBIC BOTTLE ONLY Performed at [facility], [address] [place], [place] [address]    Special Requests   Final    BOTTLES DRAWN AEROBIC ONLY Blood Culture adequate volume Performed at [facility], [address] [place], [place] [address]    Culture   Final    NO GROWTH 4 DAYS Performed at [facility], [address], [place], [place] [address]    Report Status PENDING  Incomplete  Culture, blood (Routine X 2) w Reflex to ID Panel     Status: None (Preliminary result)   Collection Time: 09/24/22 11:14 AM   Specimen: BLOOD  Result Value Ref Range Status   Specimen Description   Final    BLOOD BLOOD RIGHT ARM AEROBIC BOTTLE ONLY Performed at [facility], [address] [place], [place] [address]    Special Requests   Final    BOTTLES DRAWN AEROBIC ONLY Blood  Culture adequate volume Performed at [facility], [address] [place], [place] [address]    Culture   Final    NO GROWTH 4 DAYS Performed at [facility], [address] [place], [place] [address]    Report Status PENDING  Incomplete     Medications:    allopurinol  100 mg Oral QHS   amoxicillin-clavulanate  400 mg Oral Q12H   aspirin EC  81 mg Oral Daily   enoxaparin (LOVENOX) injection  30 mg Subcutaneous Q24H   feeding supplement  237 mL Oral BID BM   fluconazole  100 mg Oral Daily   Gerhardt's butt cream   Topical TID   melatonin  3 mg Oral QHS   multivitamin with minerals  1 tablet Oral Daily   propranolol  40 mg Oral QPM   rosuvastatin  10 mg Oral QHS   Continuous Infusions:      LOS: 5 days   Marinda Elk  [facility]  09/29/2022, 9:08 AM

## 2022-09-29 NOTE — Progress Notes (Signed)
       Overnight   NAME: Juan Benjamin MRN: [id number] DOB : May 09, 1931    Date of Service   09/29/2022   HPI/Events of Note    Notified for rapid response due to HR  HR found to be upper 40s to 50s Patient had received propranolol +/- 3 hours prior   Patient awakes to previously noted mentation and responds appropriately  BP remains acceptable SPO2 had dropped prior to call and [place] O2 is in use at 2 LPM [place]  Patient does mouth breathe   Interventions/ Plan   SPO2 monitoring ordered       Chinita Greenland BSN MSNA MSN ACNPC-AG Acute Care Nurse Practitioner [facility]

## 2022-09-30 DIAGNOSIS — J189 Pneumonia, unspecified organism: Secondary | ICD-10-CM | POA: Diagnosis not present

## 2022-09-30 DIAGNOSIS — I714 Abdominal aortic aneurysm, without rupture, unspecified: Secondary | ICD-10-CM | POA: Diagnosis not present

## 2022-09-30 DIAGNOSIS — R531 Weakness: Secondary | ICD-10-CM | POA: Diagnosis not present

## 2022-09-30 DIAGNOSIS — N179 Acute kidney failure, unspecified: Secondary | ICD-10-CM | POA: Diagnosis not present

## 2022-09-30 MED ORDER — FLUCONAZOLE 100 MG PO TABS: 100.0000 mg | ORAL_TABLET | Freq: Every day | ORAL | Status: DC

## 2022-09-30 MED ORDER — ALBUTEROL SULFATE (2.5 MG/3ML) 0.083% IN NEBU
2.5000 mg | INHALATION_SOLUTION | Freq: Four times a day (QID) | RESPIRATORY_TRACT | Status: DC | PRN
  Administered 2022-09-30: 2.5 mg via RESPIRATORY_TRACT
  Filled 2022-09-30: qty 3

## 2022-09-30 NOTE — Progress Notes (Signed)
RT note: PRN aerosol given with 2.5 mg Albuterol via blow-by with 6 lpm 02 nebulizer/aerosol mask, pt. removing 3 lpm n/c frequently along with gown, some audible/loose Rales/Crackles heard which cleared some during aerosol therapy, n/c replaced s/p, bedside P.O. Sat reading 99%/P- 67.

## 2022-09-30 NOTE — Progress Notes (Signed)
Report called to [facility].

## 2022-09-30 NOTE — Discharge Summary (Signed)
Physician Discharge Summary  JESUSANTONIO FILARDI ZOX:[id number] DOB: 12/23/1930 DOA: 09/23/2022  PCP: Richmond Campbell., PA-C  Admit date: 09/23/2022 Discharge date: 09/30/2022  Admitted From: Home Disposition:  SNF  Recommendations for Outpatient Follow-up:  Follow up with PCP in 1-2 weeks, evaluate if antihypertensive medications need to be restarted as an outpatient. Palliative care to follow-up at facility as family will wrestling with the idea of moving towards hospice  Home Health:No Equipment/Devices:None  Discharge Condition:Stable CODE STATUS:DNR Diet recommendation: Heart Healthy  Brief/Interim Summary:  89 y.o. male past medical history of AAA, aortic stenosis essential hypertension paroxysmal atrial fibrillation on anticoagulation vomiting due to generalized weakness with 3 days of diarrhea no abdominal pain   Discharge Diagnoses:  Principal Problem:   Generalized weakness Active Problems:   Aortic stenosis   Hypercalcemia   Leukocytosis   AAA (abdominal aortic aneurysm) (HCC)   CKD (chronic kidney disease) stage 4, GFR 15-29 ml/min (HCC)   Hypertension   Pressure injury of skin   Protein-calorie malnutrition, severe   AKI (acute kidney injury) (HCC)  Generalized weakness question due to aspiration pneumonia: He completed antibiotic treatment in house he remained afebrile. Speech evaluated the patient recommended dysphagia 3 diet. PT evaluated the patient, he will go to skilled nursing facility. Family agreed to meet with plan of care at facility as they are debating whether patient should move towards hospice care.  Leukocytosis: ID pneumonia now resolved.  Oral thrush: He is to complete a 7-day course of Diflucan as an outpatient.  Hypokalemia: Repleted now resolved.  Acute confusional state: Given melatonin once it is resolved.  Moderate aortic stenosis: Noted.  Hypercalcemia: Likely due to hypovolemia.  Vitamin D, TSH PTH were unremarkable PTH  related peptide to be followed up as an outpatient.  Phosphorus is unremarkable.  Chronic kidney disease stage IV: Creatinine remained at baseline.  AAA: Follow-up with vascular surgery as an outpatient.  Essential hypertension: Metoprolol and Norvasc were held on admission, he will go home off of medication.    Discharge Instructions  Discharge Instructions     Diet - low sodium heart healthy   Complete by: As directed    Increase activity slowly   Complete by: As directed    No wound care   Complete by: As directed       Allergies as of 09/30/2022       Reactions   Bee Venom Anaphylaxis   Wasp Venom Anaphylaxis   Crestor [rosuvastatin] Other (See Comments)   Myalgia. Pt is taking this medication    Gemfibrozil Other (See Comments)   unknown   Indomethacin Other (See Comments)   unknown   Lipitor [atorvastatin] Other (See Comments)   myalgia   Lisinopril Cough   Rofecoxib Other (See Comments)   unknown   Sildenafil Other (See Comments)   unknown   Simvastatin Other (See Comments)   unknown        Medication List     STOP taking these medications    amLODipine 10 MG tablet Commonly known as: NORVASC   BIOFREEZE EX   Methylcobalamin 1000 MCG Tbdp   propranolol 40 MG tablet Commonly known as: INDERAL   rosuvastatin 10 MG tablet Commonly known as: CRESTOR   zinc oxide 11.3 % Crea cream Commonly known as: BALMEX       TAKE these medications    acetaminophen 325 MG tablet Commonly known as: TYLENOL Take 650 mg by mouth every 6 (six) hours as needed for moderate pain.  albuterol 108 (90 Base) MCG/ACT inhaler Commonly known as: VENTOLIN HFA Inhale into the lungs every 6 (six) hours as needed for wheezing or shortness of breath.   allopurinol 100 MG tablet Commonly known as: ZYLOPRIM Take 100 mg by mouth at bedtime.   aspirin EC 81 MG tablet Take 1 tablet (81 mg total) by mouth daily. What changed: when to take this    diphenhydrAMINE 25 MG tablet Commonly known as: BENADRYL Take 25 mg by mouth daily as needed (Bee sting).   fluconazole 100 MG tablet Commonly known as: DIFLUCAN Take 1 tablet (100 mg total) by mouth daily for 3 days.   omeprazole 20 MG capsule Commonly known as: PRILOSEC Take 20 mg by mouth daily.   PREPARATION H EX Place 1 Application rectally as needed (hemorrhoids).   Vitamin D 50 MCG (2000 UT) tablet Take 2,000 Units by mouth daily.        Contact information for after-discharge care     Destination     [facility] Preferred SNF .   Service: Skilled Nursing Contact information: [address] [place] [address] [phone]                    Allergies  Allergen Reactions   Bee Venom Anaphylaxis   Wasp Venom Anaphylaxis   Crestor [Rosuvastatin] Other (See Comments)    Myalgia. Pt is taking this medication    Gemfibrozil Other (See Comments)    unknown   Indomethacin Other (See Comments)    unknown   Lipitor [Atorvastatin] Other (See Comments)    myalgia   Lisinopril Cough   Rofecoxib Other (See Comments)    unknown   Sildenafil Other (See Comments)    unknown   Simvastatin Other (See Comments)    unknown    Consultations: None   Procedures/Studies: CT CHEST WO CONTRAST  Result Date: 09/25/2022 CLINICAL DATA:  Weakness.  Respiratory illness.  Vomiting. EXAM: CT CHEST WITHOUT CONTRAST TECHNIQUE: Multidetector CT imaging of the chest was performed following the standard protocol without IV contrast. RADIATION DOSE REDUCTION: This exam was performed according to the departmental dose-optimization program which includes automated exposure control, adjustment of the mA and/or kV according to patient size and/or use of iterative reconstruction technique. COMPARISON:  08/10/2022 FINDINGS: Cardiovascular: Coronary, aortic arch, and branch vessel atherosclerotic vascular disease. Prior CABG. Chronically  diminutive left common carotid artery. Mediastinum/Nodes: Unremarkable Lungs/Pleura: Centrilobular emphysema. Patchy ground-glass and airspace opacities in the right lower lobe. Cavitary nodular components including a 1.5 by 1.2 cm density on image 96 series 5 and a 1.2 by 1.0 cm similar focal nodular density with internal cavitation on image 105 series 5, roughly similar to 4/20 9/24 although more blurred on that exam due to motion artifact. 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 Upper Abdomen: Pneumobilia. Stable appearance of the renal upper poles. Faint stranding around the gallbladder, cannot exclude gallbladder inflammation. Musculoskeletal: Mild thoracic spondylosis. IMPRESSION: 1. Patchy ground-glass and airspace opacities in the right lower lobe, with some cavitary nodular components. Appearance compatible with atypical infection and is roughly similar to 09/23/2022, but mostly new from 08/10/2022. 2. 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 3. Pneumobilia. 4. Faint stranding around the gallbladder, cannot exclude gallbladder inflammation. Correlate with any right upper quadrant abdominal symptoms. 5. Chronically diminutive left common carotid artery. 6. Coronary, aortic arch, and branch vessel atherosclerotic vascular disease. Prior CABG. 7. Emphysema. Aortic Atherosclerosis (ICD10-I70.0) and Emphysema (ICD10-J43.9). Electronically  Signed   By: Gaylyn Rong M.D.   On: 09/25/2022 15:46   ECHOCARDIOGRAM COMPLETE  Result Date: 09/24/2022    ECHOCARDIOGRAM REPORT   Patient Name:   Juan Benjamin Date of Exam: 09/24/2022 Medical Rec #:  [id number]        Height:       67.0 in Accession #:    [id number]       Weight:       135.1 lb Date of Birth:  September 01, 1930        BSA:          1.712 m Patient Age:    89 years         BP:           126/62 mmHg Patient Gender: M                HR:           67 bpm. Exam Location:  Inpatient  Procedure: 2D Echo, Color Doppler and Cardiac Doppler Indications:    Aortic stenosis  History:        Patient has prior history of Echocardiogram examinations. CAD;                 Risk Factors:Hypertension and Dyslipidemia.  Sonographer:    Mike Gip Referring Phys: [phone] JARED M GARDNER IMPRESSIONS  1. Left ventricular ejection fraction, by estimation, is 55%. The left ventricle has low normal function. The left ventricle has no regional wall motion abnormalities. There is moderate asymmetric left ventricular hypertrophy of the septal segment. Indeterminate diastolic filling due to E-A fusion.  2. Right ventricular systolic function is normal. The right ventricular size is normal. There is normal pulmonary artery systolic pressure.  3. The mitral valve is grossly normal. No evidence of mitral valve regurgitation.  4. Suboptimal LVOT VTI assessment precludes accuate LV SV indexing, DVI assessment or continuity equation evaluation. Visual aortic valve appears calcified with reduced opening. Mean gradient 26 mm Hg with acceleration tim 111 ms. Suspect at least moderate to severe AS. The aortic valve is calcified. Aortic valve regurgitation is mild. Moderate to severe aortic valve stenosis. Aortic valve mean gradient measures 26.0 mmHg. Comparison(s): No significant change from prior study. FINDINGS  Left Ventricle: Left ventricular ejection fraction, by estimation, is 55%. The left ventricle has low normal function. The left ventricle has no regional wall motion abnormalities. The left ventricular internal cavity size was normal in size. There is moderate asymmetric left ventricular hypertrophy of the septal segment. Indeterminate diastolic filling due to E-A fusion. Right Ventricle: The right ventricular size is normal. Right vetricular wall thickness was not well visualized. Right ventricular systolic function is normal. There is normal pulmonary artery systolic pressure. The tricuspid regurgitant velocity is  2.01 m/s, and with an assumed right atrial pressure of 3 mmHg, the estimated right ventricular systolic pressure is 19.2 mmHg. Left Atrium: Left atrial size was normal in size. Right Atrium: Right atrial size was normal in size. Pericardium: There is no evidence of pericardial effusion. Mitral Valve: The mitral valve is grossly normal. No evidence of mitral valve regurgitation. Tricuspid Valve: The tricuspid valve is normal in structure. Tricuspid valve regurgitation is not demonstrated. Aortic Valve: Suboptimal LVOT VTI assessment precludes accuate LV SV indexing, DVI assessment or continuity equation evaluation. Visual aortic valve appears calcified with reduced opening. Mean gradient 26 mm Hg with acceleration tim 111 ms. Suspect at least moderate to severe AS. The aortic valve is calcified. Aortic  valve regurgitation is mild. Aortic regurgitation PHT measures 587 msec. Moderate to severe aortic stenosis is present. Aortic valve mean gradient measures 26.0 mmHg. Aortic valve peak gradient measures 39.3 mmHg. Aortic valve area, by VTI measures 0.53 cm. Pulmonic Valve: The pulmonic valve was grossly normal. Pulmonic valve regurgitation is mild. Aorta: The aortic root and ascending aorta are structurally normal, with no evidence of dilitation. IAS/Shunts: The interatrial septum was not well visualized.  LEFT VENTRICLE PLAX 2D LVIDd:         3.80 cm     Diastology LVIDs:         2.80 cm     LV e' medial:    4.24 cm/s LV PW:         1.10 cm     LV E/e' medial:  18.2 LV IVS:        1.30 cm     LV e' lateral:   7.18 cm/s LVOT diam:     1.90 cm     LV E/e' lateral: 10.8 LV SV:         38 LV SV Index:   22 LVOT Area:     2.84 cm  LV Volumes (MOD) LV vol d, MOD A2C: 79.1 ml LV vol d, MOD A4C: 89.7 ml LV vol s, MOD A2C: 32.7 ml LV vol s, MOD A4C: 38.9 ml LV SV MOD A2C:     46.4 ml LV SV MOD A4C:     89.7 ml LV SV MOD BP:      53.2 ml RIGHT VENTRICLE            IVC RV Basal diam:  3.60 cm    IVC diam: 1.50 cm RV S prime:      7.72 cm/s TAPSE (M-mode): 1.2 cm LEFT ATRIUM             Index        RIGHT ATRIUM           Index LA diam:        3.50 cm 2.04 cm/m   RA Area:     18.30 cm LA Vol (A2C):   35.1 ml 20.50 ml/m  RA Volume:   48.00 ml  28.04 ml/m LA Vol (A4C):   34.7 ml 20.27 ml/m LA Biplane Vol: 37.2 ml 21.73 ml/m  AORTIC VALVE AV Area (Vmax):    0.62 cm AV Area (Vmean):   0.60 cm AV Area (VTI):     0.53 cm AV Vmax:           313.33 cm/s AV Vmean:          219.667 cm/s AV VTI:            0.716 m AV Peak Grad:      39.3 mmHg AV Mean Grad:      26.0 mmHg LVOT Vmax:         69.00 cm/s LVOT Vmean:        46.700 cm/s LVOT VTI:          0.133 m LVOT/AV VTI ratio: 0.19 AI PHT:            587 msec  AORTA Ao Root diam: 3.80 cm Ao Asc diam:  3.00 cm MITRAL VALVE               TRICUSPID VALVE MV Area (PHT): 6.60 cm    TR Peak grad:   16.2 mmHg MV Decel Time: 115 msec    TR Vmax:  201.00 cm/s MV E velocity: 77.25 cm/s MV A velocity: 99.90 cm/s  SHUNTS MV E/A ratio:  0.77        Systemic VTI:  0.13 m                            Systemic Diam: 1.90 cm Riley Lam MD Electronically signed by Riley Lam MD Signature Date/Time: 09/24/2022/12:35:20 PM    Final    CT ABDOMEN PELVIS WO CONTRAST  Result Date: 09/23/2022 CLINICAL DATA:  Acute abdominal pain EXAM: CT ABDOMEN AND PELVIS WITHOUT CONTRAST TECHNIQUE: Multidetector CT imaging of the abdomen and pelvis was performed following the standard protocol without IV contrast. RADIATION DOSE REDUCTION: This exam was performed according to the departmental dose-optimization program which includes automated exposure control, adjustment of the mA and/or kV according to patient size and/or use of iterative reconstruction technique. COMPARISON:  CT abdomen and pelvis 06/19/2021 FINDINGS: Lower chest: Emphysematous changes are again seen. There is some scarring in the lung bases. Hepatobiliary: No focal liver abnormality is seen. No gallstones, gallbladder wall thickening,  or biliary dilatation. Pancreas: Unremarkable. No pancreatic ductal dilatation or surrounding inflammatory changes. Spleen: Normal in size without focal abnormality. Adrenals/Urinary Tract: Right renal atrophy is stable. There are numerous bilateral renal cysts measuring up to 5.7 cm on the right and 2.0 cm on the left. There is no hydronephrosis or urinary tract calculus. The bladder and adrenal glands are within normal limits. Stomach/Bowel: Stomach is within normal limits. Appendix appears normal. No evidence of bowel wall thickening, distention, or inflammatory changes. There are scattered colonic diverticula. Vascular/Lymphatic: There is aneurysmal dilatation of the infrarenal abdominal aorta measuring 4.5 cm (previously 4.3 cm) there is also aneurysmal dilatation of the left common iliac artery measuring 4.0 cm (unchanged). There severe atherosclerotic calcifications throughout the aorta. No enlarged lymph nodes are seen. Reproductive: Prostate gland is mildly enlarged. Other: There are small fat containing bilateral inguinal hernias. No ascites. Musculoskeletal: Degenerative changes affect the spine. IMPRESSION: 1. No acute localizing process in the abdomen or pelvis. 2. 4.5 cm infrarenal abdominal aortic aneurysm, previously 4.3 cm. Recommend follow-up CT/MR every 6 months and vascular consultation. This recommendation follows ACR consensus guidelines: White Paper of the ACR Incidental Findings Committee II on Vascular Findings. J Am Coll Radiol 2013; 10:789-794. 3. 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 4. Colonic diverticulosis without evidence for diverticulitis. Aortic Atherosclerosis (ICD10-I70.0). Electronically Signed   By: Darliss Cheney M.D.   On: 09/23/2022 21:25   DG Chest 2 View  Result Date: 09/23/2022 CLINICAL DATA:  Weakness EXAM: CHEST - 2 VIEW COMPARISON:  08/10/2022 FINDINGS: Postsurgical changes of median sternotomy and left axillary surgery. Chronic left basilar  scarring. No focal airspace consolidation. Increased peripheral peribronchial opacities, right greater than left. IMPRESSION: Increased peripheral peribronchial opacities, right greater than left, which may represent active infection or sequela of recent pneumonia. Electronically Signed   By: Deatra Robinson M.D.   On: 09/23/2022 19:53   (Echo, Carotid, EGD, Colonoscopy, ERCP)    Subjective: No complaints  Discharge Exam: Vitals:   09/30/22 0233 09/30/22 0629  BP: (!) 152/83 116/60  Pulse: 62 (!) 52  Resp: (!) 24 (!) 24  Temp:    SpO2: (!) 88% 93%   Vitals:   09/29/22 2232 09/30/22 0212 09/30/22 0233 09/30/22 0629  BP: (!) 157/69  (!) 152/83 116/60  Pulse: (!) 43  62 (!) 52  Resp: (!) 24  Marland Kitchen)  24 (!) 24  Temp:      TempSrc:      SpO2: (!) 89% 99% (!) 88% 93%  Weight:      Height:        General: Pt is alert, awake, not in acute distress Cardiovascular: RRR, S1/S2 +, no rubs, no gallops Respiratory: CTA bilaterally, no wheezing, no rhonchi Abdominal: Soft, NT, ND, bowel sounds + Extremities: no edema, no cyanosis    The results of significant diagnostics from this hospitalization (including imaging, microbiology, ancillary and laboratory) are listed below for reference.     Microbiology: Recent Results (from the past 240 hour(s))  Culture, blood (Routine X 2) w Reflex to ID Panel     Status: None   Collection Time: 09/24/22 11:14 AM   Specimen: BLOOD  Result Value Ref Range Status   Specimen Description   Final    BLOOD BLOOD LEFT ARM AEROBIC BOTTLE ONLY Performed at [facility], [address], [place], [place] [address]    Special Requests   Final    BOTTLES DRAWN AEROBIC ONLY Blood Culture adequate volume Performed at [facility], [address], [place], [place] [address]    Culture   Final    NO GROWTH 5 DAYS Performed at [facility], [address], [place], [place] [address]    Report Status 09/29/2022 FINAL   Final  Culture, blood (Routine X 2) w Reflex to ID Panel     Status: None   Collection Time: 09/24/22 11:14 AM   Specimen: BLOOD  Result Value Ref Range Status   Specimen Description   Final    BLOOD BLOOD RIGHT ARM AEROBIC BOTTLE ONLY Performed at [facility], [address] [place], [place] [address]    Special Requests   Final    BOTTLES DRAWN AEROBIC ONLY Blood Culture adequate volume Performed at [facility], [address], [place], [place] [address]    Culture   Final    NO GROWTH 5 DAYS Performed at [facility], [address] [place], [place] [address]    Report Status 09/29/2022 FINAL  Final     Labs: BNP (last 3 results) Recent Labs    08/10/22 1630  BNP 997.2*   Basic Metabolic Panel: Recent Labs  Lab 09/24/22 0547 09/25/22 0531 09/25/22 0959 09/26/22 0609 09/26/22 0612 09/28/22 1134  NA 143 140 140  --  143 143  K 3.9 3.0* 3.6  --  3.1* 3.4*  CL 108 107 107  --  108 109  CO2 24 22 21*  --  25 23  GLUCOSE 99 90 104*  --  96 83  BUN 40* 36* 35*  --  33* 29*  CREATININE 2.05* 1.85* 1.92*  --  1.93* 1.82*  CALCIUM 11.3* 11.2* 11.2*  11.6*  --  11.6* 11.2*  PHOS  --   --  3.0 2.9  --   --    Liver Function Tests: Recent Labs  Lab 09/23/22 1854 09/25/22 0959  AST 21  --   ALT 9  --   ALKPHOS 76  --   BILITOT 1.3*  --   PROT 5.6*  --   ALBUMIN 2.5* 2.3*   No results for input(s): "LIPASE", "AMYLASE" in the last 168 hours. No results for input(s): "AMMONIA" in the last 168 hours. CBC: Recent Labs  Lab 09/23/22 1854 09/24/22 0547 09/25/22 0531 09/26/22 0609  WBC 25.1* 23.1* 20.9* 16.5*  NEUTROABS 21.4*  --  17.4* 13.4*  HGB 13.8 14.3 13.1 13.2  HCT 41.9 43.7 40.1 41.0  MCV 88.0 88.6 87.7 89.1  PLT 195 192 191 215   Cardiac Enzymes: No results for input(s): "CKTOTAL", "CKMB", "CKMBINDEX", "TROPONINI" in the last 168 hours. BNP: Invalid input(s): "POCBNP" CBG: No results for input(s):  "GLUCAP" in the last 168 hours. D-Dimer No results for input(s): "DDIMER" in the last 72 hours. Hgb A1c No results for input(s): "HGBA1C" in the last 72 hours. Lipid Profile No results for input(s): "CHOL", "HDL", "LDLCALC", "TRIG", "CHOLHDL", "LDLDIRECT" in the last 72 hours. Thyroid function studies No results for input(s): "TSH", "T4TOTAL", "T3FREE", "THYROIDAB" in the last 72 hours.  Invalid input(s): "FREET3" Anemia work up No results for input(s): "VITAMINB12", "FOLATE", "FERRITIN", "TIBC", "IRON", "RETICCTPCT" in the last 72 hours. Urinalysis    Component Value Date/Time   COLORURINE YELLOW 09/23/2022 1908   APPEARANCEUR CLEAR 09/23/2022 1908   LABSPEC 1.014 09/23/2022 1908   PHURINE 5.0 09/23/2022 1908   GLUCOSEU NEGATIVE 09/23/2022 1908   HGBUR SMALL (A) 09/23/2022 1908   BILIRUBINUR NEGATIVE 09/23/2022 1908   KETONESUR NEGATIVE 09/23/2022 1908   PROTEINUR 100 (A) 09/23/2022 1908   UROBILINOGEN 0.2 11/06/2008 0645   NITRITE NEGATIVE 09/23/2022 1908   LEUKOCYTESUR NEGATIVE 09/23/2022 1908   Sepsis Labs Recent Labs  Lab 09/23/22 1854 09/24/22 0547 09/25/22 0531 09/26/22 0609  WBC 25.1* 23.1* 20.9* 16.5*   Microbiology Recent Results (from the past 240 hour(s))  Culture, blood (Routine X 2) w Reflex to ID Panel     Status: None   Collection Time: 09/24/22 11:14 AM   Specimen: BLOOD  Result Value Ref Range Status   Specimen Description   Final    BLOOD BLOOD LEFT ARM AEROBIC BOTTLE ONLY Performed at [facility], [address] [place], [place] [address]    Special Requests   Final    BOTTLES DRAWN AEROBIC ONLY Blood Culture adequate volume Performed at [facility], [address] [place], [place] [address]    Culture   Final    NO GROWTH 5 DAYS Performed at [facility], [address] [place], [place] [address]    Report Status 09/29/2022 FINAL  Final  Culture, blood (Routine X 2) w Reflex to ID Panel      Status: None   Collection Time: 09/24/22 11:14 AM   Specimen: BLOOD  Result Value Ref Range Status   Specimen Description   Final    BLOOD BLOOD RIGHT ARM AEROBIC BOTTLE ONLY Performed at [facility], [address], [place], [place] [address]    Special Requests   Final    BOTTLES DRAWN AEROBIC ONLY Blood Culture adequate volume Performed at [facility], [address] [place], [place] [address]    Culture   Final    NO GROWTH 5 DAYS Performed at [facility], [address] [place], [place] [address]    Report Status 09/29/2022 FINAL  Final     SIGNED:   Marinda Elk, MD  [facility] 09/30/2022, 8:57 AM Pager   If 7PM-7AM, please contact night-coverage [email or web address] Password [email or web address]

## 2022-09-30 NOTE — Progress Notes (Signed)
   09/29/22 2025  Assess: MEWS Score  Temp (!) 95.8 F (35.4 C)  BP (!) 125/59  MAP (mmHg) 81  Pulse Rate (!) 41  Resp (!) 24  Level of Consciousness Alert  SpO2 93 %  O2 Device Nasal Cannula  O2 Flow Rate (L/min) 2 L/min  Assess: MEWS Score  MEWS Temp 1  MEWS Systolic 0  MEWS Pulse 1  MEWS RR 1  MEWS LOC 0  MEWS Score 3  MEWS Score Color Yellow  Assess: if the MEWS score is Yellow or Red  Were vital signs taken at a resting state? Yes  Focused Assessment Change from prior assessment (see assessment flowsheet)  Does the patient meet 2 or more of the SIRS criteria? Yes  Does the patient have a confirmed or suspected source of infection? Yes  MEWS guidelines implemented  Yes, yellow  Treat  MEWS Interventions Considered administering scheduled or prn medications/treatments as ordered  Take Vital Signs  Increase Vital Sign Frequency  Yellow: Q2hr x1, continue Q4hrs until patient remains green for 12hrs  Escalate  MEWS: Escalate Yellow: Discuss with charge nurse and consider notifying provider and/or RRT  Notify: Charge Nurse/RN  Name of Charge Nurse/RN Notified Vera, RN  Provider Notification  Provider Name/Title Johann Capers  Date Provider Notified 09/29/22  Time Provider Notified 2011  Method of Notification Call  Notification Reason Change in status  Provider response At bedside  Date of Provider Response 09/29/22  Time of Provider Response 2025  Notify: Rapid Response  Date Rapid Response Notified 09/29/22  Time Rapid Response Notified 2011  Assess: SIRS CRITERIA  SIRS Temperature  1  SIRS Pulse 0  SIRS Respirations  1  SIRS WBC 0  SIRS Score Sum  2   Pt put on 2L [place] O2 and continous pulse ox. Pt alert, responsive, easily arousable but drifts back to sleep when not bothered.will continue to monitor.

## 2022-09-30 NOTE — TOC Transition Note (Signed)
Transition of Care [facility]) - CM/SW Discharge Note   Patient Details  Name: Juan Benjamin MRN: [id number] Date of Birth: [birth year]  Transition of Care [facility]) CM/SW Contact:  Erin Sons, LCSW Phone Number: 09/30/2022, 10:50 AM   Clinical Narrative:     Patient will DC to: [facility] Anticipated DC date: 09/30/22 Family notified: Niece Clydie Braun Transport by: Sharin Mons   Per MD patient ready for DC to [facility]. RN, patient, patient's family, and facility notified of DC. Discharge Summary and FL2 sent to facility. RN to call report prior to discharge [phone]  room 702p). DC packet on chart. Ambulance transport requested for patient.   CSW will sign off for now as social work intervention is no longer needed. Please consult [place] again if new needs arise.   Final next level of care: Skilled Nursing Facility Barriers to Discharge: No Barriers Identified   Patient Goals and CMS Choice CMS Medicare.gov Compare Post Acute Care list provided to:: Patient Represenative (must comment) Choice offered to / list presented to : [facility] POA / Guardian  Discharge Placement                Patient chooses bed at: [facility] Patient to be transferred to facility by: [facility] Name of family member notified: Niece Patient and family notified of of transfer: 09/30/22  Discharge Plan and Services Additional resources added to the After Visit Summary for   In-house Referral: Clinical Social Work Discharge Planning Services: NA Post Acute Care Choice: Skilled Nursing Facility          DME Arranged: N/A DME Agency: NA                  Social Determinants of Health (SDOH) Interventions SDOH Screenings   Food Insecurity: No Food Insecurity (09/24/2022)  Housing: Low Risk  (09/24/2022)  Transportation Needs: No Transportation Needs (09/24/2022)  Utilities: Not At Risk (09/24/2022)  Tobacco Use: Medium Risk (09/25/2022)     Readmission Risk Interventions    09/25/2022   11:39 AM  Readmission  Risk Prevention Plan  Transportation Screening Complete  PCP or Specialist Appt within 5-7 Days Complete  Home Care Screening Complete  Medication Review (RN CM) Complete

## 2022-10-01 ENCOUNTER — Inpatient Hospital Stay (HOSPITAL_COMMUNITY)
Admission: EM | Admit: 2022-10-01 | Discharge: 2022-10-26 | DRG: 951 | Disposition: E | Payer: PPO | Attending: Internal Medicine | Admitting: Internal Medicine

## 2022-10-01 ENCOUNTER — Encounter (HOSPITAL_COMMUNITY): Payer: Self-pay | Admitting: Emergency Medicine

## 2022-10-01 ENCOUNTER — Other Ambulatory Visit: Payer: Self-pay

## 2022-10-01 DIAGNOSIS — R54 Age-related physical debility: Secondary | ICD-10-CM | POA: Diagnosis present

## 2022-10-01 DIAGNOSIS — I129 Hypertensive chronic kidney disease with stage 1 through stage 4 chronic kidney disease, or unspecified chronic kidney disease: Secondary | ICD-10-CM | POA: Diagnosis present

## 2022-10-01 DIAGNOSIS — I714 Abdominal aortic aneurysm, without rupture, unspecified: Secondary | ICD-10-CM | POA: Diagnosis present

## 2022-10-01 DIAGNOSIS — J690 Pneumonitis due to inhalation of food and vomit: Secondary | ICD-10-CM | POA: Diagnosis present

## 2022-10-01 DIAGNOSIS — R627 Adult failure to thrive: Secondary | ICD-10-CM | POA: Diagnosis present

## 2022-10-01 DIAGNOSIS — A419 Sepsis, unspecified organism: Secondary | ICD-10-CM | POA: Diagnosis present

## 2022-10-01 DIAGNOSIS — Z888 Allergy status to other drugs, medicaments and biological substances status: Secondary | ICD-10-CM

## 2022-10-01 DIAGNOSIS — Z85820 Personal history of malignant melanoma of skin: Secondary | ICD-10-CM | POA: Diagnosis not present

## 2022-10-01 DIAGNOSIS — Z951 Presence of aortocoronary bypass graft: Secondary | ICD-10-CM

## 2022-10-01 DIAGNOSIS — E039 Hypothyroidism, unspecified: Secondary | ICD-10-CM | POA: Diagnosis present

## 2022-10-01 DIAGNOSIS — Z6821 Body mass index (BMI) 21.0-21.9, adult: Secondary | ICD-10-CM

## 2022-10-01 DIAGNOSIS — R64 Cachexia: Secondary | ICD-10-CM | POA: Diagnosis present

## 2022-10-01 DIAGNOSIS — J9621 Acute and chronic respiratory failure with hypoxia: Secondary | ICD-10-CM | POA: Diagnosis present

## 2022-10-01 DIAGNOSIS — Z91038 Other insect allergy status: Secondary | ICD-10-CM

## 2022-10-01 DIAGNOSIS — I480 Paroxysmal atrial fibrillation: Secondary | ICD-10-CM | POA: Diagnosis present

## 2022-10-01 DIAGNOSIS — R1310 Dysphagia, unspecified: Secondary | ICD-10-CM

## 2022-10-01 DIAGNOSIS — Z87891 Personal history of nicotine dependence: Secondary | ICD-10-CM

## 2022-10-01 DIAGNOSIS — Z515 Encounter for palliative care: Secondary | ICD-10-CM | POA: Diagnosis not present

## 2022-10-01 DIAGNOSIS — R6521 Severe sepsis with septic shock: Secondary | ICD-10-CM | POA: Diagnosis present

## 2022-10-01 DIAGNOSIS — Z91030 Bee allergy status: Secondary | ICD-10-CM

## 2022-10-01 DIAGNOSIS — N184 Chronic kidney disease, stage 4 (severe): Secondary | ICD-10-CM | POA: Diagnosis present

## 2022-10-01 DIAGNOSIS — E785 Hyperlipidemia, unspecified: Secondary | ICD-10-CM | POA: Diagnosis present

## 2022-10-01 DIAGNOSIS — Z7982 Long term (current) use of aspirin: Secondary | ICD-10-CM

## 2022-10-01 DIAGNOSIS — N179 Acute kidney failure, unspecified: Secondary | ICD-10-CM | POA: Diagnosis not present

## 2022-10-01 DIAGNOSIS — G9341 Metabolic encephalopathy: Secondary | ICD-10-CM | POA: Diagnosis present

## 2022-10-01 DIAGNOSIS — E43 Unspecified severe protein-calorie malnutrition: Secondary | ICD-10-CM | POA: Diagnosis present

## 2022-10-01 DIAGNOSIS — Z8679 Personal history of other diseases of the circulatory system: Secondary | ICD-10-CM

## 2022-10-01 DIAGNOSIS — Z66 Do not resuscitate: Secondary | ICD-10-CM | POA: Diagnosis present

## 2022-10-01 DIAGNOSIS — J9601 Acute respiratory failure with hypoxia: Secondary | ICD-10-CM

## 2022-10-01 DIAGNOSIS — R451 Restlessness and agitation: Secondary | ICD-10-CM | POA: Diagnosis present

## 2022-10-01 DIAGNOSIS — R410 Disorientation, unspecified: Principal | ICD-10-CM

## 2022-10-01 DIAGNOSIS — Z79899 Other long term (current) drug therapy: Secondary | ICD-10-CM

## 2022-10-01 DIAGNOSIS — I2510 Atherosclerotic heart disease of native coronary artery without angina pectoris: Secondary | ICD-10-CM | POA: Diagnosis present

## 2022-10-01 DIAGNOSIS — K219 Gastro-esophageal reflux disease without esophagitis: Secondary | ICD-10-CM | POA: Diagnosis present

## 2022-10-01 DIAGNOSIS — Z8546 Personal history of malignant neoplasm of prostate: Secondary | ICD-10-CM

## 2022-10-01 LAB — CBC WITH DIFFERENTIAL/PLATELET
Abs Immature Granulocytes: 0.23 10*3/uL — ABNORMAL HIGH (ref 0.00–0.07)
Basophils Absolute: 0.1 10*3/uL (ref 0.0–0.1)
Basophils Relative: 0 %
Eosinophils Absolute: 0 10*3/uL (ref 0.0–0.5)
Eosinophils Relative: 0 %
HCT: 36 % — ABNORMAL LOW (ref 39.0–52.0)
Hemoglobin: 11.2 g/dL — ABNORMAL LOW (ref 13.0–17.0)
Immature Granulocytes: 1 %
Lymphocytes Relative: 7 %
Lymphs Abs: 1.4 10*3/uL (ref 0.7–4.0)
MCH: 28.6 pg (ref 26.0–34.0)
MCHC: 31.1 g/dL (ref 30.0–36.0)
MCV: 92.1 fL (ref 80.0–100.0)
Monocytes Absolute: 0.9 10*3/uL (ref 0.1–1.0)
Monocytes Relative: 5 %
Neutro Abs: 16.3 10*3/uL — ABNORMAL HIGH (ref 1.7–7.7)
Neutrophils Relative %: 87 %
Platelets: 169 10*3/uL (ref 150–400)
RBC: 3.91 MIL/uL — ABNORMAL LOW (ref 4.22–5.81)
RDW: 15.5 % (ref 11.5–15.5)
WBC: 18.8 10*3/uL — ABNORMAL HIGH (ref 4.0–10.5)
nRBC: 0 % (ref 0.0–0.2)

## 2022-10-01 LAB — BASIC METABOLIC PANEL
Anion gap: 12 (ref 5–15)
BUN: 52 mg/dL — ABNORMAL HIGH (ref 8–23)
CO2: 20 mmol/L — ABNORMAL LOW (ref 22–32)
Calcium: 10.1 mg/dL (ref 8.9–10.3)
Chloride: 119 mmol/L — ABNORMAL HIGH (ref 98–111)
Creatinine, Ser: 3.14 mg/dL — ABNORMAL HIGH (ref 0.61–1.24)
GFR, Estimated: 18 mL/min — ABNORMAL LOW (ref >60)
Glucose, Bld: 136 mg/dL — ABNORMAL HIGH (ref 70–99)
Potassium: 3.4 mmol/L — ABNORMAL LOW (ref 3.5–5.1)
Sodium: 151 mmol/L — ABNORMAL HIGH (ref 135–145)

## 2022-10-01 MED ORDER — GLYCOPYRROLATE 0.2 MG/ML IJ SOLN: 0.2000 mg | INTRAMUSCULAR | Status: DC | PRN

## 2022-10-01 MED ORDER — GLYCOPYRROLATE 0.2 MG/ML IJ SOLN
0.2000 mg | INTRAMUSCULAR | Status: DC | PRN
  Administered 2022-10-02: 0.2 mg via INTRAVENOUS
  Filled 2022-10-01: qty 1

## 2022-10-01 MED ORDER — POLYVINYL ALCOHOL 1.4 % OP SOLN
1.0000 [drp] | Freq: Four times a day (QID) | OPHTHALMIC | Status: DC | PRN
  Filled 2022-10-01: qty 15

## 2022-10-01 MED ORDER — HALOPERIDOL LACTATE 5 MG/ML IJ SOLN: 2.5000 mg | INTRAMUSCULAR | Status: DC | PRN

## 2022-10-01 MED ORDER — SODIUM CHLORIDE 0.9 % IV SOLN: INTRAVENOUS | Status: DC

## 2022-10-01 MED ORDER — ONDANSETRON HCL 4 MG/2ML IJ SOLN: 4.0000 mg | Freq: Four times a day (QID) | INTRAMUSCULAR | Status: DC | PRN

## 2022-10-01 MED ORDER — ACETAMINOPHEN 325 MG PO TABS: 650.0000 mg | ORAL_TABLET | Freq: Four times a day (QID) | ORAL | Status: DC | PRN

## 2022-10-01 MED ORDER — GLYCOPYRROLATE 1 MG PO TABS
1.0000 mg | ORAL_TABLET | ORAL | Status: DC | PRN
  Filled 2022-10-01: qty 1

## 2022-10-01 MED ORDER — ONDANSETRON 4 MG PO TBDP: 4.0000 mg | ORAL_TABLET | Freq: Four times a day (QID) | ORAL | Status: DC | PRN

## 2022-10-01 MED ORDER — MORPHINE 100MG IN NS 100ML (1MG/ML) PREMIX INFUSION
0.0000 mg/h | INTRAVENOUS | Status: DC
  Administered 2022-10-01 – 2022-10-03 (×3): 5 mg/h via INTRAVENOUS
  Filled 2022-10-01 (×4): qty 100

## 2022-10-01 MED ORDER — MORPHINE BOLUS VIA INFUSION: 5.0000 mg | INTRAVENOUS | Status: DC | PRN

## 2022-10-01 MED ORDER — SODIUM CHLORIDE 0.9 % IV BOLUS
500.0000 mL | Freq: Once | INTRAVENOUS | Status: AC
  Administered 2022-10-01: 500 mL via INTRAVENOUS

## 2022-10-01 MED ORDER — MIDAZOLAM HCL 2 MG/2ML IJ SOLN: 2.0000 mg | INTRAMUSCULAR | Status: DC | PRN

## 2022-10-01 MED ORDER — ACETAMINOPHEN 650 MG RE SUPP: 650.0000 mg | Freq: Four times a day (QID) | RECTAL | Status: DC | PRN

## 2022-10-01 MED ORDER — MORPHINE SULFATE (PF) 2 MG/ML IV SOLN: 2.0000 mg | INTRAVENOUS | Status: DC | PRN

## 2022-10-01 NOTE — Assessment & Plan Note (Signed)
Dysphagia leading to chronic aspiration of food as well as upper airway secretions. The patient was discharged on 09/30/2022. He was requiring 3L O2 by nasal cannula upon discharge to SNF yesterday. He is requiring 6L upon admission. The patient will be admitted to comfort care as per the wishes of family. He will be kept on 2L by nasal cannula with morphine for air hunger.

## 2022-10-01 NOTE — ED Triage Notes (Signed)
Patient BIB EMS from SNF ([facility]) c/o AMS.  Per report worsening AMS this afternoon. Pt was 88% on 12 L upon EMS arrival in facility. Pt had 1mg  versed and 2 mg Morphine given by EMS for agitation and confusion. Pt was discharge yesterday in the hospital.

## 2022-10-01 NOTE — Assessment & Plan Note (Signed)
Noted. The patient is comfort care.

## 2022-10-01 NOTE — ED Provider Notes (Signed)
Emergency Department Provider Note   I have reviewed the triage vital signs and the nursing notes.   HISTORY  Chief Complaint Altered Mental Status   HPI Juan Benjamin is a 89 y.o. male with past history reviewed recently discharged from the hospital yesterday with presumed recurrent aspiration pneumonia and weakness.  He is accompanied by his niece and brought in by EMS.  EMS report that on scene they found the patient agitated, grabbing at his oxygen mask, hypotensive, and hypoxemic.  The niece tells me that he was discharged yesterday with plan to coordinate with hospice.  She states that he actually had some intervals of more clarity yesterday at which point he specifically asked for hospice.  He has a MOST form currently outlining limited interventions. No report from facility regarding vomiting or other acute event overnight.   Level 5 caveat: Unresponsive.   Past Medical History:  Diagnosis Date   AAA (abdominal aortic aneurysm) (HCC)    Aortic stenosis    Carotid artery occlusion    Chronic occlusion of left, R CEA   CKD (chronic kidney disease) stage 4, GFR 15-29 ml/min (HCC) 09/24/2022   Coronary artery disease    GERD (gastroesophageal reflux disease)    Hyperlipidemia    Hypertension    Hypothyroidism    PAF (paroxysmal atrial fibrillation) (HCC)    Pneumonia    Prostate cancer (HCC)     Review of Systems  Level 5 caveat: Patient is unresponsive.   ____________________________________________   PHYSICAL EXAM:  VITAL SIGNS: ED Triage Vitals  Enc Vitals Group     BP 10/18/2022 1725 (!) 54/41     Pulse Rate 09/30/2022 1725 (!) 39     Resp 09/29/2022 1725 (!) 26     Temp 10/11/2022 1725 (!) 91.9 F (33.3 C)     Temp Source 10/07/2022 1725 Rectal     SpO2 10/07/2022 1725 91 %     Weight 09/30/2022 1727 135 lb 2.3 oz (61.3 kg)     Height 10/14/2022 1727 5\' 7"  (1.702 m)   Constitutional: Not responding to verbal or tactile stimulus.  Eyes: Conjunctivae are  normal. Head: Atraumatic. Nose: No congestion/rhinnorhea. Mouth/Throat: Mucous membranes are dry.  Cardiovascular: Bradycardia. Good peripheral circulation. Grossly normal heart sounds.   Respiratory: Normal respiratory effort. No air hunger signs. Diminished breath sounds on the left.  Gastrointestinal: Soft and nontender. No distention.  Musculoskeletal: No gross deformities of extremities. Neurologic: Unresponsive.  Skin:  Skin is warm, dry and intact. No rash noted.   ____________________________________________   LABS (all labs ordered are listed, but only abnormal results are displayed)  Labs Reviewed  BASIC METABOLIC PANEL - Abnormal; Notable for the following components:      Result Value   Sodium 151 (*)    Potassium 3.4 (*)    Chloride 119 (*)    CO2 20 (*)    Glucose, Bld 136 (*)    BUN 52 (*)    Creatinine, Ser 3.14 (*)    GFR, Estimated 18 (*)    All other components within normal limits  CBC WITH DIFFERENTIAL/PLATELET - Abnormal; Notable for the following components:   WBC 18.8 (*)    RBC 3.91 (*)    Hemoglobin 11.2 (*)    HCT 36.0 (*)    Neutro Abs 16.3 (*)    Abs Immature Granulocytes 0.23 (*)    All other components within normal limits    ____________________________________________   PROCEDURES  Procedure(s) performed:  Procedures  None  ____________________________________________   INITIAL IMPRESSION / ASSESSMENT AND PLAN / ED COURSE  Pertinent labs & imaging results that were available during my care of the patient were reviewed by me and considered in my medical decision making (see chart for details).   This patient is Presenting for Evaluation of AMS, which does require a range of treatment options, and is a complaint that involves a high risk of morbidity and mortality.  The Differential Diagnoses includes but is not exclusive to alcohol, illicit or prescription medications, intracranial pathology such as stroke, intracerebral  hemorrhage, fever or infectious causes including sepsis, hypoxemia, uremia, trauma, endocrine related disorders such as diabetes, hypoglycemia, thyroid-related diseases, etc.   Critical Interventions-    Medications  0.9 %  sodium chloride infusion ( Intravenous New Bag/Given 10/02/2022 1858)  acetaminophen (TYLENOL) tablet 650 mg (has no administration in time range)    Or  acetaminophen (TYLENOL) suppository 650 mg (has no administration in time range)  glycopyrrolate (ROBINUL) tablet 1 mg (has no administration in time range)    Or  glycopyrrolate (ROBINUL) injection 0.2 mg (has no administration in time range)    Or  glycopyrrolate (ROBINUL) injection 0.2 mg (has no administration in time range)  polyvinyl alcohol (LIQUIFILM TEARS) 1.4 % ophthalmic solution 1 drop (has no administration in time range)  morphine bolus via infusion 5 mg (has no administration in time range)  morphine 100mg  in NS (1mg /mL) infusion - premix (5 mg/hr Intravenous New Bag/Given 10/11/2022 1902)  midazolam (VERSED) injection 2-4 mg (has no administration in time range)  ondansetron (ZOFRAN-ODT) disintegrating tablet 4 mg (has no administration in time range)    Or  ondansetron (ZOFRAN) injection 4 mg (has no administration in time range)  0.9 %  sodium chloride infusion ( Intravenous Not Given 10/05/2022 1858)  morphine (PF) 2 MG/ML injection 2-4 mg (has no administration in time range)  haloperidol lactate (HALDOL) injection 2.5-5 mg (has no administration in time range)  sodium chloride 0.9 % bolus 500 mL (500 mLs Intravenous New Bag/Given 10/19/2022 1857)    Reassessment after intervention: patient is resting comfortably.    I did obtain Additional Historical Information from EMS and daughter at bedside.   I decided to review pertinent External Data, and in summary d/c from [facility] service yesterday. Hospice referral at that time.    Clinical Laboratory Tests Ordered, included basic metabolic shows worsening  kidney function at 3.14 with hypernatremia to 151.  Patient also with significant leukocytosis now to 18.8 likely in the setting of infection.   Radiologic Tests: considered chest imaging but after discussion with family, plan to pursue comfort measures.   Cardiac Monitor Tracing which shows bradycardia.   Consult complete with [facility], Dr. Gerri Lins. Plan for admit for comfort care.   Medical Decision Making: Summary:  Patient presents to the emergency department for evaluation of profound hypoxemia, hypotension, agitation.  Patient given Versed and morphine for comfort and route and arrives with MOST form describing limited interventions.  I discussed the patient's case in detail with his niece, and medical decision maker, at bedside.  We had an open discussion regarding his hypothermia, hypotension, hypoxemia.  I suspect he may have aspirated as he is at high risk for this.  She states he is very clear and not only for his wish to avoid intubation but apparently yesterday specifically described wanting hospice and comfort.  She would like to avoid antibiotics and focus on keeping him comfortable and allowing him to  pass naturally. Will start comfort care measures and monitor.   Reevaluation with update and discussion with family at bedside. Patient resting comfortably. Plan for admit.   Patient's presentation is most consistent with acute presentation with potential threat to life or bodily function.   Disposition: admit  ____________________________________________  FINAL CLINICAL IMPRESSION(S) / ED DIAGNOSES  Final diagnoses:  Delirium  AKI (acute kidney injury) (HCC)  Acute respiratory failure with hypoxia (HCC)    Note:  This document was prepared using Dragon voice recognition software and may include unintentional dictation errors.  Alona Bene, MD, [facility] Emergency Medicine    Rosaly Labarbera, Arlyss Repress, MD 10/30/2022 2118

## 2022-10-01 NOTE — Assessment & Plan Note (Signed)
The patient was discharged on 3L O2. He presented today on 6L. He is now comfort care on 2L by [place] with morphine for air hunger.

## 2022-10-01 NOTE — H&P (Signed)
History and Physical    Patient: Juan Benjamin ZOX:[id number] DOB: 1930-10-09 DOA: 09/27/2022 DOS: the patient was seen and examined on 09/25/2022 PCP: Richmond Campbell., PA-C  Patient coming from: SNF  Chief Complaint:  Chief Complaint  Patient presents with   Altered Mental Status   HPI: Juan Benjamin is a 89 y.o. male with medical history significant for AAA, AS, HTN, hypothyroidism, PAF not on AC. recently discharged from the hospital yesterday with presumed recurrent aspiration pneumonia and weakness.  He is accompanied by his niece and brought in by EMS.  EMS report that on scene they found the patient agitated, grabbing at his oxygen mask, hypotensive, and hypoxemic.  The niece tells me that he was discharged yesterday with plan to coordinate with hospice.  She states that he actually had some intervals of more clarity yesterday at which point he specifically asked for hospice. The niece states that when she misunderstood him and repeated back to him that he wanted to go to the hospital, he literally spelled out the work "hospice".   It seems that the patient has continued to aspirate. He is currently requireing 6 L in the ED. He is moribund. Family is at bedside. They want comfort care only. This has been ordered. I will place order for palliative care.   Review of Systems: unable to review all systems due to the inability of the patient to answer questions. Past Medical History:  Diagnosis Date   AAA (abdominal aortic aneurysm) (HCC)    Aortic stenosis    Carotid artery occlusion    Chronic occlusion of left, R CEA   CKD (chronic kidney disease) stage 4, GFR 15-29 ml/min (HCC) 09/24/2022   Coronary artery disease    GERD (gastroesophageal reflux disease)    Hyperlipidemia    Hypertension    Hypothyroidism    PAF (paroxysmal atrial fibrillation) (HCC)    Pneumonia    Prostate cancer [facility])    Past Surgical History:  Procedure Laterality Date   BILIARY STENT PLACEMENT   09/29/2020   Procedure: BILIARY STENT PLACEMENT;  Surgeon: Lemar Lofty., MD;  Location: [facility] ENDOSCOPY;  Service: Gastroenterology;;   BILIARY STENT PLACEMENT N/A 12/15/2020   Procedure: BILIARY STENT PLACEMENT;  Surgeon: Lemar Lofty., MD;  Location: [facility] ENDOSCOPY;  Service: Gastroenterology;  Laterality: N/A;   BIOPSY  09/29/2020   Procedure: BIOPSY;  Surgeon: Meridee Score Netty Starring., MD;  Location: [facility] ENDOSCOPY;  Service: Gastroenterology;;   CAROTID ARTERY SURGERY     Right    CHOLECYSTECTOMY N/A 09/26/2020   Procedure: LAPAROSCOPIC SUBTOTAL CHOLECYSTECTOMY;  Surgeon: Diamantina Monks, MD;  Location: [facility] OR;  Service: General;  Laterality: N/A;   CORONARY ARTERY BYPASS GRAFT     CABG (4 vessel, 2000.  No report in [facility])   ENDOSCOPIC RETROGRADE CHOLANGIOPANCREATOGRAPHY (ERCP) WITH PROPOFOL N/A 09/29/2020   Procedure: ENDOSCOPIC RETROGRADE CHOLANGIOPANCREATOGRAPHY (ERCP) WITH PROPOFOL;  Surgeon: Meridee Score Netty Starring., MD;  Location: [facility] ENDOSCOPY;  Service: Gastroenterology;  Laterality: N/A;   ENDOSCOPIC RETROGRADE CHOLANGIOPANCREATOGRAPHY (ERCP) WITH PROPOFOL N/A 12/15/2020   Procedure: ENDOSCOPIC RETROGRADE CHOLANGIOPANCREATOGRAPHY (ERCP) WITH PROPOFOL;  Surgeon: Meridee Score Netty Starring., MD;  Location: [facility] ENDOSCOPY;  Service: Gastroenterology;  Laterality: N/A;   ENDOSCOPIC RETROGRADE CHOLANGIOPANCREATOGRAPHY (ERCP) WITH PROPOFOL N/A 03/12/2021   Procedure: ENDOSCOPIC RETROGRADE CHOLANGIOPANCREATOGRAPHY (ERCP) WITH PROPOFOL;  Surgeon: Meridee Score Netty Starring., MD;  Location: [facility] ENDOSCOPY;  Service: Gastroenterology;  Laterality: N/A;   HEMOSTASIS CLIP PLACEMENT  12/15/2020   Procedure: HEMOSTASIS CLIP PLACEMENT;  Surgeon: Corliss Parish  Montez Hageman., MD;  Location: [facility] ENDOSCOPY;  Service: Gastroenterology;;   MELANOMA EXCISION WITH SENTINEL LYMPH NODE BIOPSY Left 03/21/2020   Procedure: WIDE LOCAL EXCISION LEFT UPPER ARM MELANOMA ADVANCEMENT FLAP CLOSURE FOR DEFECT 4X8CM SENTINEL LYMPH  NODE MAPPING AND BIOPSY;  Surgeon: Almond Lint, MD;  Location: [facility] OR;  Service: General;  Laterality: Left;   PANCREATIC STENT PLACEMENT  09/29/2020   Procedure: PANCREATIC STENT PLACEMENT;  Surgeon: Lemar Lofty., MD;  Location: [facility] ENDOSCOPY;  Service: Gastroenterology;;   REMOVAL OF STONES  09/29/2020   Procedure: REMOVAL OF STONES;  Surgeon: Lemar Lofty., MD;  Location: [facility] ENDOSCOPY;  Service: Gastroenterology;;   REMOVAL OF STONES  12/15/2020   Procedure: REMOVAL OF STONES;  Surgeon: Lemar Lofty., MD;  Location: [facility] ENDOSCOPY;  Service: Gastroenterology;;   REMOVAL OF STONES  03/12/2021   Procedure: REMOVAL OF SLUDGE;  Surgeon: Lemar Lofty., MD;  Location: [facility] ENDOSCOPY;  Service: Gastroenterology;;   RENAL ARTERY STENT  07/02/1999   RIGHT SIDE   SPHINCTEROTOMY  09/29/2020   Procedure: SPHINCTEROTOMY;  Surgeon: Mansouraty, Netty Starring., MD;  Location: [facility] ENDOSCOPY;  Service: Gastroenterology;;   Francine Graven REMOVAL  12/15/2020   Procedure: STENT REMOVAL;  Surgeon: Lemar Lofty., MD;  Location: [facility] ENDOSCOPY;  Service: Gastroenterology;;   Francine Graven REMOVAL  03/12/2021   Procedure: STENT REMOVAL;  Surgeon: Lemar Lofty., MD;  Location: [facility] ENDOSCOPY;  Service: Gastroenterology;;   Social History:  reports that he quit smoking about 41 years ago. His smoking use included cigarettes. He has never used smokeless tobacco. He reports that he does not drink alcohol and does not use drugs.  Allergies  Allergen Reactions   Bee Venom Anaphylaxis   Wasp Venom Anaphylaxis   Crestor [Rosuvastatin] Other (See Comments)    Myalgia. Pt is taking this medication    Gemfibrozil Other (See Comments)    unknown   Indomethacin Other (See Comments)    unknown   Lipitor [Atorvastatin] Other (See Comments)    myalgia   Lisinopril Cough   Rofecoxib Other (See Comments)    unknown   Sildenafil Other (See Comments)    unknown   Simvastatin Other (See Comments)     unknown    Family History  Problem Relation Age of Onset   Bone cancer Mother    Colon cancer Neg Hx    Esophageal cancer Neg Hx    Inflammatory bowel disease Neg Hx    Liver disease Neg Hx    Pancreatic cancer Neg Hx    Rectal cancer Neg Hx    Stomach cancer Neg Hx     Prior to Admission medications   Medication Sig Start Date End Date Taking? Authorizing Provider  acetaminophen (TYLENOL) 325 MG tablet Take 650 mg by mouth every 6 (six) hours as needed for moderate pain.    [provider]  albuterol (VENTOLIN HFA) 108 (90 Base) MCG/ACT inhaler Inhale into the lungs every 6 (six) hours as needed for wheezing or shortness of breath.    [provider]  allopurinol (ZYLOPRIM) 100 MG tablet Take 100 mg by mouth at bedtime.     [provider]  aspirin 81 MG tablet Take 1 tablet (81 mg total) by mouth daily. Patient taking differently: Take 81 mg by mouth at bedtime. 02/24/20   Rollene Rotunda, MD  Cholecalciferol (VITAMIN D) 50 MCG (2000 UT) tablet Take 2,000 Units by mouth daily.    [provider]  diphenhydrAMINE (BENADRYL) 25 MG tablet Take 25  mg by mouth daily as needed (Bee sting).    [provider]  fluconazole (DIFLUCAN) 100 MG tablet Take 1 tablet (100 mg total) by mouth daily for 3 days. 09/30/22 11/01/22  Marinda Elk, MD  Hydrocortisone (PREPARATION H EX) Place 1 Application rectally as needed (hemorrhoids).    [provider]  omeprazole (PRILOSEC) 20 MG capsule Take 20 mg by mouth daily.    [provider]  ranitidine (ZANTAC) 150 MG tablet Take 1 tablet (150 mg total) by mouth 2 (two) times daily. Patient not taking: Reported on 01/20/2017 11/28/16 12/16/19  Cy Blamer, MD    Physical Exam: Vitals:   10/07/2022 1725 10/15/2022 1727 09/26/2022 1930 10/10/2022 2140  BP: (!) 54/41   (!) 86/43  Pulse: (!) 39  (!) 42 (!) 45  Resp: (!) 26  17 16   Temp: (!) 91.9 F (33.3 C)     TempSrc: Rectal     SpO2: 91%  98%  98%  Weight:  61.3 kg    Height:  5\' 7"  (1.702 m)     Exam:  Constitutional:  The patient is moribund. He is not able to answer questions or participate in history. Neck:  neck appears normal, no masses, normal ROM, supple no thyromegaly Respiratory:  No increased work of breathing. Lung sounds are distant. No wheezes, rales, or rhonchi No tactile fremitus Cardiovascular:  Regular rate and rhythm No murmurs, ectopy, or gallups. No lateral PMI. No thrills. Abdomen:  Abdomen is soft, non-tender, non-distended No hernias, masses, or organomegaly Normoactive bowel sounds.  Musculoskeletal:  No cyanosis, clubbing, or edema. The extremities are cachectic. Skin:  No rashes, lesions, ulcers palpation of skin: no induration or nodules The skin is thin and tenting. Neurologic:  Unable to evaluate due to the patient's inability to cooperate with exam. Psychiatric:  Mental status Unable to evaluate due to the patient's inability to cooperate with exam.  Data Reviewed:  CBC    Component Value Date/Time   WBC 18.8 (H) 10/21/2022 1740   RBC 3.91 (L) 10/08/2022 1740   HGB 11.2 (L) 10/05/2022 1740   HCT 36.0 (L) 10/19/2022 1740   PLT 169 10/09/2022 1740   MCV 92.1 10/09/2022 1740   MCH 28.6 10/17/2022 1740   MCHC 31.1 10/19/2022 1740   RDW 15.5 10/04/2022 1740   LYMPHSABS 1.4 10/24/2022 1740   MONOABS 0.9 10/20/2022 1740   EOSABS 0.0 10/02/2022 1740   BASOSABS 0.1 09/28/2022 1740      Latest Ref Rng & Units 10/07/2022    5:40 PM 09/28/2022   11:34 AM 09/26/2022    6:12 AM  BMP  Glucose 70 - 99 mg/dL 161  83  96   BUN 8 - 23 mg/dL 52  29  33   Creatinine 0.61 - 1.24 mg/dL 0.96  0.45  4.09   Sodium 135 - 145 mmol/L 151  143  143   Potassium 3.5 - 5.1 mmol/L 3.4  3.4  3.1   Chloride 98 - 111 mmol/L 119  109  108   CO2 22 - 32 mmol/L 20  23  25    Calcium 8.9 - 10.3 mg/dL 81.1  91.4  78.2     Assessment and Plan: Dysphagia Dysphagia leading to chronic aspiration of food  as well as upper airway secretions. The patient was discharged on 09/30/2022. He was requiring 3L O2 by nasal cannula upon discharge to SNF yesterday. He is requiring 6L upon admission. The patient will be admitted to comfort care  as per the wishes of family. He will be kept on 2L by nasal cannula with morphine for air hunger.  Protein-calorie malnutrition, severe Noted. The patient is comfort care.  Acute on chronic respiratory failure with hypoxia (HCC) The patient was discharged on 3L O2. He presented today on 6L. He is now comfort care on 2L by [place] with morphine for air hunger.  Aspiration pneumonia (HCC) The patient presents again with acute on chronic hypoxic  respiratory failure due to ongoing aspiration. His niece understands that his aspiration is due to dysphagia that is not treatable. The patient distinctly told her yesterday that he wanted hospice. He is now comfort care.  Sepsis (HCC) The patient has presented with hypothermia, hypotension, and tachypnea.  His WBC is 18.8 with creatinine of 3.14. He is now comfort care. Palliative care has been consulted.      Advance Care Planning:   Code Status: DNR Comfort care only.  Consults: Palliative care.  Family Communication: Niece and her spouse are at bedside.  Severity of Illness: The appropriate patient status for this patient is INPATIENT. Inpatient status is judged to be reasonable and necessary in order to provide the required intensity of service to ensure the patient's safety. The patient's presenting symptoms, physical exam findings, and initial radiographic and laboratory data in the context of their chronic comorbidities is felt to place them at high risk for further clinical deterioration. Furthermore, it is not anticipated that the patient will be medically stable for discharge from the hospital within 2 midnights of admission.   * I certify that at the point of admission it is my clinical judgment that the patient will  require inpatient hospital care spanning beyond 2 midnights from the point of admission due to high intensity of service, high risk for further deterioration and high frequency of surveillance required.*  Author: Ashritha Desrosiers, DO 2022/10/23 11:40 PM  For on call review [email or web address].

## 2022-10-02 DIAGNOSIS — Z515 Encounter for palliative care: Secondary | ICD-10-CM

## 2022-10-02 NOTE — Assessment & Plan Note (Signed)
The patient presents again with acute on chronic hypoxic  respiratory failure due to ongoing aspiration. His niece understands that his aspiration is due to dysphagia that is not treatable. The patient distinctly told her yesterday that he wanted hospice. He is now comfort care.

## 2022-10-02 NOTE — TOC Initial Note (Signed)
Transition of Care [facility]) - Initial/Assessment Note    Patient Details  Name: Juan Benjamin MRN: [id number] Date of Birth: 1930-06-13  Transition of Care [facility]) CM/SW Contact:    Beckie Busing, RN Phone Number: 10/02/2022, 3:45 PM  Clinical Narrative:                 [facility] acknowledges consult for Residential hospice referral for [facility]. Referral has been sent to [facility] at [facility]. Per Danford Bad there are no [facility] beds today , patient will be evaluated tomorrow. TOC following.   Expected Discharge Plan: Hospice Medical Facility Barriers to Discharge: Hospice Bed not available   Patient Goals and CMS Choice   CMS Medicare.gov Compare Post Acute Care list provided to:: Patient Represenative (must comment) Choice offered to / list presented to :  [facility] POA)      Expected Discharge Plan and Services In-house Referral: NA Discharge Planning Services: CM Consult Post Acute Care Choice: Residential Hospice Bed Living arrangements for the past 2 months: Single Family Home                 DME Arranged: N/A DME Agency: NA       HH Arranged: NA (Hospice Referral ([facility]))          Prior Living Arrangements/Services Living arrangements for the past 2 months: Single Family Home Lives with:: Self              Current home services: DME    Activities of Daily Living      Permission Sought/Granted                  Emotional Assessment              Admission diagnosis:  Delirium [R41.0] Acute respiratory failure with hypoxia (HCC) [J96.01] AKI (acute kidney injury) (HCC) [N17.9] Sepsis (HCC) [A41.9] Patient Active Problem List   Diagnosis Date Noted   End of life care 10/02/2022   Sepsis (HCC) 09/27/2022   Acute on chronic respiratory failure with hypoxia (HCC) 10/02/2022   Dysphagia 10/20/2022   AAA (abdominal aortic aneurysm) (HCC) 09/24/2022   Generalized weakness 09/24/2022   Hypercalcemia 09/24/2022   Pressure injury of  skin 09/24/2022   Leukocytosis 09/24/2022   CKD (chronic kidney disease) stage 4, GFR 15-29 ml/min (HCC) 09/24/2022   Protein-calorie malnutrition, severe 09/24/2022   AKI (acute kidney injury) (HCC) 09/24/2022   History of ERCP 11/03/2020   History of biliary stent insertion 11/03/2020   History of cholecystectomy 11/03/2020   Presence of pancreatic duct stent 11/03/2020   Change in bowel habits 11/03/2020   Aortic stenosis    Prostate cancer (HCC)    Aspiration pneumonia (HCC)    Gallbladder sludge    Bile leak    Acute cholecystitis 09/27/2020   Abdominal pain 09/25/2020   New onset a-fib [facility]) 09/25/2020   Educated about COVID-19 virus infection 02/22/2020   Murmur 12/13/2010   Coronary artery disease    Hypertension    Carotid artery occlusion    Hyperlipidemia    PCP:  Richmond Campbell., PA-C Pharmacy:   [facility] [phone] - [place], [place] - [address] [place] [address] Phone: [phone] Fax: [phone]  [facility] - [place], [place] - [address] [place] [address] Phone: [phone] Fax: [phone]     Social Determinants of Health (SDOH) Social  History: SDOH Screenings   Food Insecurity: No Food Insecurity (09/24/2022)  Housing: Low Risk  (09/24/2022)  Transportation Needs: No Transportation Needs (09/24/2022)  Utilities: Not At Risk (09/24/2022)  Tobacco Use: Medium Risk (2022/10/25)   SDOH Interventions:     Readmission Risk Interventions    10/02/2022    3:35 PM 09/25/2022   11:39 AM  Readmission Risk Prevention Plan  Transportation Screening Complete Complete  PCP or Specialist Appt within 5-7 Days  Complete  PCP or Specialist Appt within 3-5 Days Complete   Home Care Screening  Complete  Medication Review (RN CM)  Complete  HRI or Home Care Consult Complete   Social Work Consult for Recovery Care  Planning/Counseling Complete   Palliative Care Screening Complete   Medication Review [employer]) Complete

## 2022-10-02 NOTE — Progress Notes (Signed)
  Daily Progress Note   Patient Name: Juan Benjamin       Date: 10/02/2022 DOB: 1931-01-15  Age: 89 y.o. MRN#: [id number] Attending Physician: Dorcas Carrow, MD Primary Care Physician: Richmond Campbell., PA-C Admit Date: 2022-10-28 Length of Stay: 1 day  Discussed care with primary hospitalist today. Palliative medicine team consulted overnight for end of life care. Hospitalist has already discussed with patient's family today and placed referral to [facility] regarding evaluation for [facility] inpatient hospice evaluation. Hospitalist stated no PMT support/needs required at this time. Will cancel consult. Please reach out if our team can be of assistance with patient's care. Thank you.   Alvester Morin, DO Palliative Care Provider PMT # [phone]

## 2022-10-02 NOTE — ED Notes (Signed)
Md at bedside and states he does not want patient to be moved. Would like a bed at [facility]

## 2022-10-02 NOTE — Progress Notes (Signed)
PROGRESS NOTE    Juan Benjamin  ZOX:[id number] DOB: 26-May-1931 DOA: 10/25/2022 PCP: Richmond Campbell., PA-C    Brief Narrative:  89 year old gentleman with history of AAA, aortic stenosis, hypertension, hypothyroidism, paroxysmal A-fib not on anticoagulation who was recently admitted to the hospital with recurrent aspiration pneumonia and extreme weakness.  Patient was transferred to a skilled nursing facility with palliative care referral.  He went to a skilled nursing facility, was agitated, hypotensive and hypoxemic.  Patient himself when he is clear up his mind had asked them to take him to hospice where his wife was present 10 years ago. Arrived to the ER, on 6 L of oxygen, unresponsive.  Started on comfort care measures.   Assessment & Plan:   Recurrent aspiration pneumonia due to dysphagia Severe protein calorie malnutrition, adult failure to thrive Acute on chronic respiratory failure with hypoxemia, respiratory distress Frailty and debility Multiple underlying chronic medical issues Acute metabolic encephalopathy  Plan of care: Due to patient's severe medical conditions and inability to recover, shared decision making was done with patient's niece who is his healthcare power of attorney and patient started on comfort care pathway. All comfort care medications are available. Patient is currently on morphine infusion 5 mg/h, titrate to keep patient comfortable.  Unable to eat. Remains persistently encephalopathic, anticipate hospital death. Unrestricted visitor policy. RN to pronounce death if happens in the hospital. Family is interested to transfer [facility] to [facility], however today he is quite unresponsive.  Will start referral to inpatient hospice, however we may anticipate him to die in the hospital.  Will provide end-of-life care.   DVT prophylaxis: Comfort care   Code Status: Comfort care Family Communication: Niece at the bedside. Disposition Plan: Status is:  Inpatient Remains inpatient appropriate because: End-of-life care     Consultants:  Hospice  Procedures:  None  Antimicrobials:  None   Subjective: Patient seen and examined in the morning rounds.  His niece and family member were at the bedside.  Patient remains obtunded and comfortable.  Currently on morphine infusion 5 mg/h.  Objective: Vitals:   09/28/2022 1727 09/26/2022 1930 10/10/2022 2140 10/02/22 1404  BP:   (!) 86/43 (!) 67/36  Pulse:  (!) 42 (!) 45 (!) 49  Resp:  17 16 16   Temp:      TempSrc:      SpO2:  98% 98% 98%  Weight: 61.3 kg     Height: 5\' 7"  (1.702 m)      No intake or output data in the 24 hours ending 10/02/22 1423 Filed Weights   09/25/2022 1727  Weight: 61.3 kg    Examination:  General: Chronically sick looking.  Obtunded.  Currently medicated for comfort.  Intermittent irregular breathing patterns. Cardiovascular: S1-S2 normal. Respiratory: Comfortable on minimum oxygen.       Data Reviewed: I have personally reviewed following labs and imaging studies  CBC: Recent Labs  Lab 09/26/22 0609 10/08/2022 1740  WBC 16.5* 18.8*  NEUTROABS 13.4* 16.3*  HGB 13.2 11.2*  HCT 41.0 36.0*  MCV 89.1 92.1  PLT 215 169   Basic Metabolic Panel: Recent Labs  Lab 09/26/22 0609 09/26/22 0612 09/28/22 1134 09/25/2022 1740  NA  --  143 143 151*  K  --  3.1* 3.4* 3.4*  CL  --  108 109 119*  CO2  --  25 23 20*  GLUCOSE  --  96 83 136*  BUN  --  33* 29* 52*  CREATININE  --  1.93* 1.82* 3.14*  CALCIUM  --  11.6* 11.2* 10.1  PHOS 2.9  --   --   --    GFR: Estimated Creatinine Clearance: 13.8 mL/min (A) (by C-G formula based on SCr of 3.14 mg/dL (H)). Liver Function Tests: No results for input(s): "AST", "ALT", "ALKPHOS", "BILITOT", "PROT", "ALBUMIN" in the last 168 hours. No results for input(s): "LIPASE", "AMYLASE" in the last 168 hours. No results for input(s): "AMMONIA" in the last 168 hours. Coagulation Profile: No results for input(s): "INR",  "PROTIME" in the last 168 hours. Cardiac Enzymes: No results for input(s): "CKTOTAL", "CKMB", "CKMBINDEX", "TROPONINI" in the last 168 hours. BNP (last 3 results) No results for input(s): "PROBNP" in the last 8760 hours. HbA1C: No results for input(s): "HGBA1C" in the last 72 hours. CBG: No results for input(s): "GLUCAP" in the last 168 hours. Lipid Profile: No results for input(s): "CHOL", "HDL", "LDLCALC", "TRIG", "CHOLHDL", "LDLDIRECT" in the last 72 hours. Thyroid Function Tests: No results for input(s): "TSH", "T4TOTAL", "FREET4", "T3FREE", "THYROIDAB" in the last 72 hours. Anemia Panel: No results for input(s): "VITAMINB12", "FOLATE", "FERRITIN", "TIBC", "IRON", "RETICCTPCT" in the last 72 hours. Sepsis Labs: No results for input(s): "PROCALCITON", "LATICACIDVEN" in the last 168 hours.  Recent Results (from the past 240 hour(s))  Culture, blood (Routine X 2) w Reflex to ID Panel     Status: None   Collection Time: 09/24/22 11:14 AM   Specimen: BLOOD  Result Value Ref Range Status   Specimen Description   Final    BLOOD BLOOD LEFT ARM AEROBIC BOTTLE ONLY Performed at [facility], [address] [place], [place] [address]    Special Requests   Final    BOTTLES DRAWN AEROBIC ONLY Blood Culture adequate volume Performed at [facility], [address] [place], [place] [address]    Culture   Final    NO GROWTH 5 DAYS Performed at [facility], [address], [place], [place] [address]    Report Status 09/29/2022 FINAL  Final  Culture, blood (Routine X 2) w Reflex to ID Panel     Status: None   Collection Time: 09/24/22 11:14 AM   Specimen: BLOOD  Result Value Ref Range Status   Specimen Description   Final    BLOOD BLOOD RIGHT ARM AEROBIC BOTTLE ONLY Performed at [facility], [address] [place], [place] [address]    Special Requests   Final    BOTTLES DRAWN AEROBIC ONLY Blood Culture adequate  volume Performed at [facility], [address], [place], [place] [address]    Culture   Final    NO GROWTH 5 DAYS Performed at [facility], [address] [place], [place] [address]    Report Status 09/29/2022 FINAL  Final         Radiology Studies: No results found.      Scheduled Meds: Continuous Infusions:  morphine 5 mg/hr (10/02/22 1406)     LOS: 1 day    Time spent: 50 minutes    Dorcas Carrow, MD [facility] Pager [phone]

## 2022-10-02 NOTE — ED Notes (Signed)
Pt resting comfortably, comfort care measures. Orders continued. Family remains at bedside. Offers no complaints at this time.

## 2022-10-02 NOTE — ED Notes (Signed)
ED TO INPATIENT HANDOFF REPORT  Name/Age/Gender Juan Benjamin 89 y.o. male  Code Status    Code Status Orders  (From admission, onward)           Start     Ordered   10/16/2022 1833  Do not attempt resuscitation (DNR)  Continuous       Question Answer Comment  If patient has no pulse and is not breathing Do Not Attempt Resuscitation   If patient has a pulse and/or is breathing: Medical Treatment Goals COMFORT MEASURES: Keep clean/warm/dry, use medication by any route; positioning, wound care and other measures to relieve pain/suffering; use oxygen, suction/manual treatment of airway obstruction for comfort; do not transfer unless for comfort needs.   Consent: Discussion documented in EHR or advanced directives reviewed      10/15/2022 1834           Code Status History     Date Active Date Inactive Code Status Order ID Comments User Context   10/07/2022 1739 09/29/2022 1834 DNR [id number]  Maia Plan, MD ED   09/25/2022 1042 09/30/2022 1619 DNR [id number]  Marinda Elk, MD Inpatient   09/24/2022 0254 09/25/2022 1042 Full Code [id number]  Hillary Bow, DO Inpatient   09/25/2020 0314 10/04/2020 1802 Full Code [id number]  Hillary Bow, DO ED       Home/SNF/Other Comfort care  Chief Complaint Sepsis [facility]) [A41.9]  Level of Care/Admitting Diagnosis ED Disposition     ED Disposition  Admit   Condition  --   Comment  Hospital Area: [facility] [[id number]]  Level of Care: Med-Surg [16]  May admit patient to [facility] or [facility] if equivalent level of care is available:: Yes  Covid Evaluation: Asymptomatic - no recent exposure (last 10 days) testing not required  Diagnosis: Sepsis [facility]) [[id number]]  Admitting Physician: Fran Lowes [phone]  Attending Physician: Fran Lowes [phone]  Certification:: I certify this patient will need inpatient services for at least 2 midnights  Estimated Length of Stay: 3          Medical History Past  Medical History:  Diagnosis Date   AAA (abdominal aortic aneurysm) (HCC)    Aortic stenosis    Carotid artery occlusion    Chronic occlusion of left, R CEA   CKD (chronic kidney disease) stage 4, GFR 15-29 ml/min (HCC) 09/24/2022   Coronary artery disease    GERD (gastroesophageal reflux disease)    Hyperlipidemia    Hypertension    Hypothyroidism    PAF (paroxysmal atrial fibrillation) (HCC)    Pneumonia    Prostate cancer (HCC)     Allergies Allergies  Allergen Reactions   Bee Venom Anaphylaxis   Wasp Venom Anaphylaxis   Crestor [Rosuvastatin] Other (See Comments)    Myalgia. Pt is taking this medication    Gemfibrozil Other (See Comments)    unknown   Indomethacin Other (See Comments)    unknown   Lipitor [Atorvastatin] Other (See Comments)    myalgia   Lisinopril Cough   Rofecoxib Other (See Comments)    unknown   Sildenafil Other (See Comments)    unknown   Simvastatin Other (See Comments)    unknown    IV Location/Drains/Wounds Patient Lines/Drains/Airways Status     Active Line/Drains/Airways     Name Placement date Placement time Site Days   Peripheral IV 10/18/2022 20 G Left Antecubital 10/02/2022  1738  Antecubital  1   Closed System Drain Right;Anterior RUQ Bulb (  JP) 19 Fr. 09/26/20  0957  RUQ  736   GI Stent 4 Fr. 09/29/20  1718  --  733   GI Stent  12/15/20  1422  --  656   Incision - 4 Ports Abdomen Upper;Right Upper;Right Upper;Right Mid;Left 09/26/20  0936  -- 736   Pressure Injury 09/24/22 Buttocks Lower Stage 1 -  Intact skin with non-blanchable redness of a localized area usually over a bony prominence. redness 09/24/22  0136  -- 8            Labs/Imaging Results for orders placed or performed during the hospital encounter of 2022-10-10 (from the past 48 hour(s))  Basic metabolic panel     Status: Abnormal   Collection Time: 2022-10-10  5:40 PM  Result Value Ref Range   Sodium 151 (H) 135 - 145 mmol/L   Potassium 3.4 (L) 3.5 - 5.1 mmol/L    Chloride 119 (H) 98 - 111 mmol/L   CO2 20 (L) 22 - 32 mmol/L   Glucose, Bld 136 (H) 70 - 99 mg/dL    Comment: Glucose reference range applies only to samples taken after fasting for at least 8 hours.   BUN 52 (H) 8 - 23 mg/dL   Creatinine, Ser 2.13 (H) 0.61 - 1.24 mg/dL   Calcium 08.6 8.9 - 57.8 mg/dL   GFR, Estimated 18 (L) >60 mL/min    Comment: (NOTE) Calculated using the CKD-EPI Creatinine Equation (2021)    Anion gap 12 5 - 15    Comment: Performed at [facility], [address], [place], [place] [address]  CBC with Differential     Status: Abnormal   Collection Time: 2022/10/10  5:40 PM  Result Value Ref Range   WBC 18.8 (H) 4.0 - 10.5 K/uL   RBC 3.91 (L) 4.22 - 5.81 MIL/uL   Hemoglobin 11.2 (L) 13.0 - 17.0 g/dL   HCT 95.2 (L) 84.1 - 32.4 %   MCV 92.1 80.0 - 100.0 fL   MCH 28.6 26.0 - 34.0 pg   MCHC 31.1 30.0 - 36.0 g/dL   RDW 40.1 02.7 - 25.3 %   Platelets 169 150 - 400 K/uL   nRBC 0.0 0.0 - 0.2 %   Neutrophils Relative % 87 %   Neutro Abs 16.3 (H) 1.7 - 7.7 K/uL   Lymphocytes Relative 7 %   Lymphs Abs 1.4 0.7 - 4.0 K/uL   Monocytes Relative 5 %   Monocytes Absolute 0.9 0.1 - 1.0 K/uL   Eosinophils Relative 0 %   Eosinophils Absolute 0.0 0.0 - 0.5 K/uL   Basophils Relative 0 %   Basophils Absolute 0.1 0.0 - 0.1 K/uL   Immature Granulocytes 1 %   Abs Immature Granulocytes 0.23 (H) 0.00 - 0.07 K/uL    Comment: Performed at [facility], [address], [place], [place] [address]   No results found.  Pending Labs Unresulted Labs (From admission, onward)    None       Vitals/Pain Today's Vitals   Oct 10, 2022 1725 10-10-2022 1727 2022-10-10 1930 10-10-22 2140  BP: (!) 54/41   (!) 86/43  Pulse: (!) 39  (!) 42 (!) 45  Resp: (!) 26  17 16   Temp: (!) 91.9 F (33.3 C)     TempSrc: Rectal     SpO2: 91%  98% 98%  Weight:  61.3 kg    Height:  5\' 7"  (1.702 m)      Isolation Precautions No active  isolations  Medications Medications  0.9 %  sodium chloride infusion ( Intravenous New Bag/Given 2022-10-05 1858)  acetaminophen (TYLENOL) tablet 650 mg (has no administration in time range)    Or  acetaminophen (TYLENOL) suppository 650 mg (has no administration in time range)  glycopyrrolate (ROBINUL) tablet 1 mg ( Oral See Alternative 10/02/22 0327)    Or  glycopyrrolate (ROBINUL) injection 0.2 mg ( Subcutaneous See Alternative 10/02/22 0327)    Or  glycopyrrolate (ROBINUL) injection 0.2 mg (0.2 mg Intravenous Given 10/02/22 0327)  polyvinyl alcohol (LIQUIFILM TEARS) 1.4 % ophthalmic solution 1 drop (has no administration in time range)  morphine bolus via infusion 5 mg (has no administration in time range)  morphine 100mg  in NS (1mg /mL) infusion - premix (5 mg/hr Intravenous New Bag/Given 10/05/2022 1902)  midazolam (VERSED) injection 2-4 mg (has no administration in time range)  ondansetron (ZOFRAN-ODT) disintegrating tablet 4 mg (has no administration in time range)    Or  ondansetron (ZOFRAN) injection 4 mg (has no administration in time range)  0.9 %  sodium chloride infusion ( Intravenous Not Given 10/05/22 1858)  morphine (PF) 2 MG/ML injection 2-4 mg (has no administration in time range)  haloperidol lactate (HALDOL) injection 2.5-5 mg (has no administration in time range)  sodium chloride 0.9 % bolus 500 mL (0 mLs Intravenous Stopped Oct 05, 2022 2100)    Mobility non-ambulatory

## 2022-10-02 NOTE — Progress Notes (Signed)
Civil engineer, contracting [facility]) Hospital Liaison Note  Referral received for patient/family interest in [facility]. Chart under review by [facility] physician.   No beds today at [facility]. Evaluation will take place tomorrow.   Please call with any questions or concerns. Thank you  Dionicio Stall, [facility] Liaison [phone]

## 2022-10-02 NOTE — Progress Notes (Signed)
Do not send patient to [facility] cone for admit. Will provide end of life care here at [facility] and refer to [facility]. He may pass away in hospital..

## 2022-10-02 NOTE — Assessment & Plan Note (Signed)
The patient has presented with hypothermia, hypotension, and tachypnea.  His WBC is 18.8 with creatinine of 3.14. He is now comfort care. Palliative care has been consulted.

## 2022-10-03 DIAGNOSIS — Z515 Encounter for palliative care: Secondary | ICD-10-CM | POA: Diagnosis not present

## 2022-10-03 LAB — PTH-RELATED PEPTIDE: PTH-related peptide: <2 pmol/L

## 2022-10-10 IMAGING — DX DG CHEST 1V PORT
1 series · 1 of 1 positions shown · non-contrast
Comparison: 11/06/2008

CLINICAL DATA: Postprandial upper abdominal pain

EXAM:
PORTABLE CHEST 1 VIEW

[chest ap]
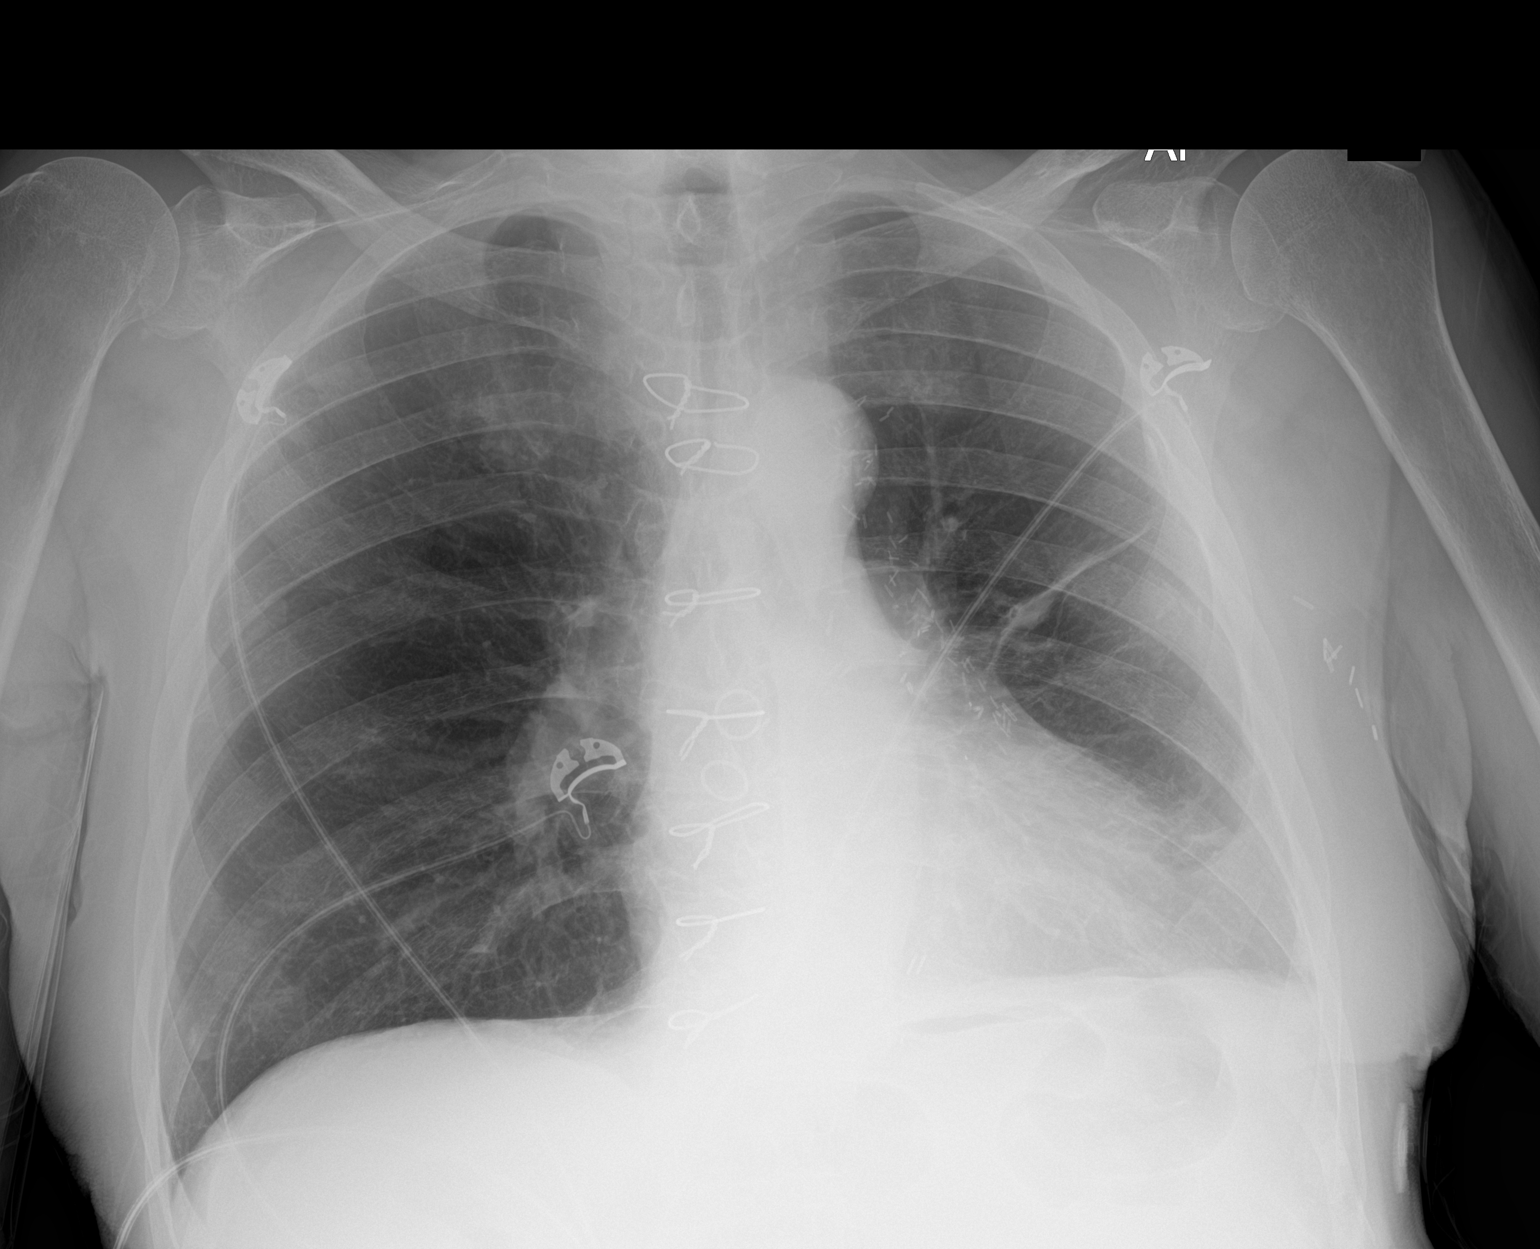

[1 of 1 positions shown; findings below may reference images not displayed]

FINDINGS: Single frontal view of the chest demonstrates postsurgical changes
from CABG. Cardiac silhouette is unremarkable. Chronic scarring
within the left mid and lower lung zones. No acute airspace disease,
effusion, or pneumothorax. No acute bony abnormalities.
IMPRESSION: 1. No acute intrathoracic process.

## 2022-10-19 IMAGING — CT CT ABD-PELV W/O CM
2 of 4 series · 15 of 46 positions shown, 17 images · non-contrast
Comparison: 09/24/2020

CLINICAL DATA: Status post cholecystectomy 09/26/2020.

EXAM:
CT ABDOMEN AND PELVIS WITHOUT CONTRAST
TECHNIQUE: Multidetector CT imaging of the abdomen and pelvis was performed
following the standard protocol without IV contrast.

[Series 3: a/p w/o 5mm · axial · non-contrast · 0.88mm/px · z∈[+277,+702]mm · 12 of 97 slices shown, 14 images]
[im 8/97  soft-tissue]
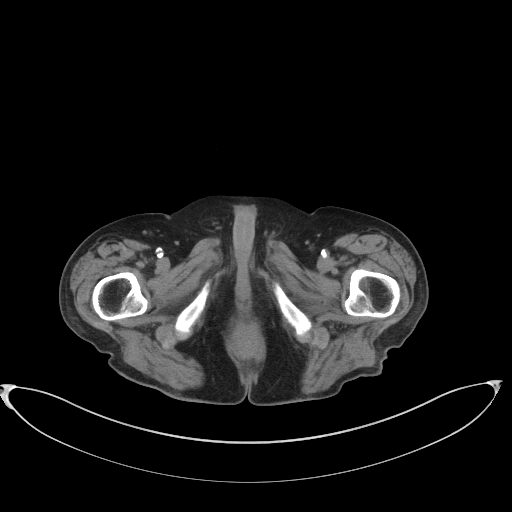
[im 8/97  bone]
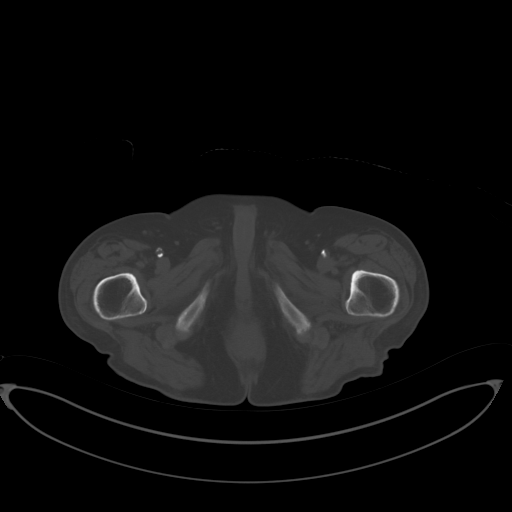
[im 16/97  soft-tissue]
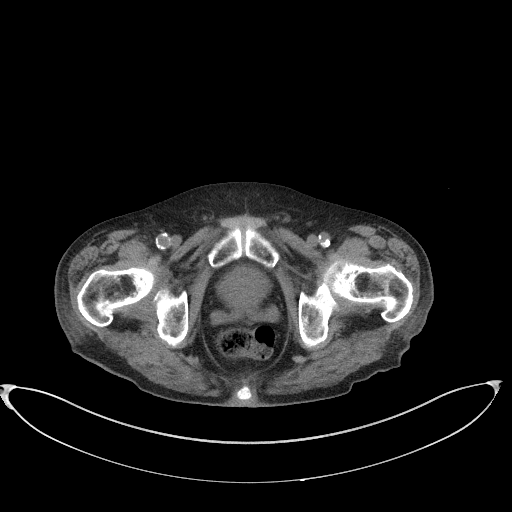
[im 24/97  soft-tissue]
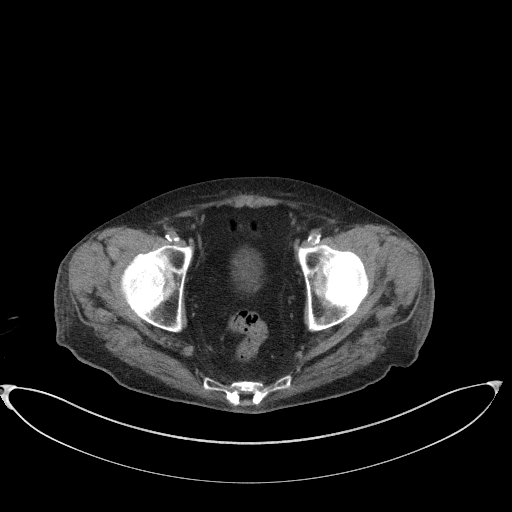
[im 31/97  soft-tissue]
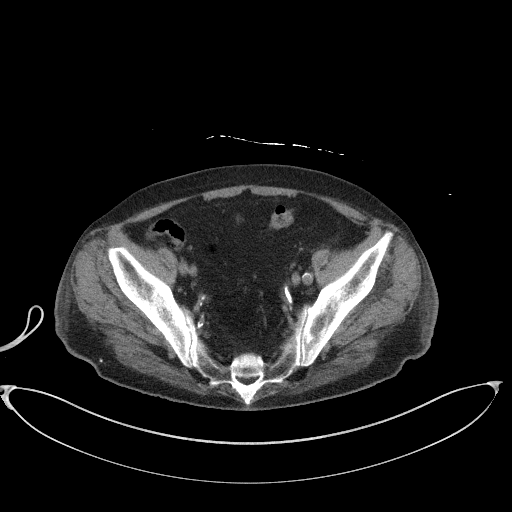
[im 39/97  soft-tissue]
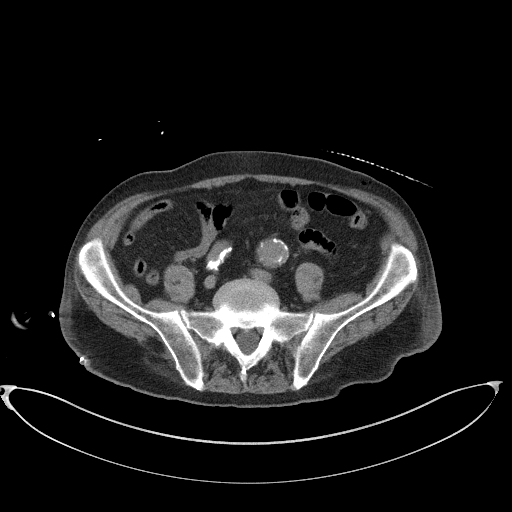
[im 47/97  soft-tissue]
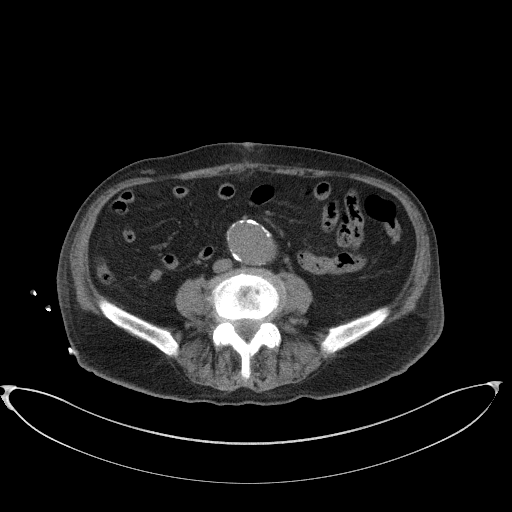
[im 54/97  soft-tissue]
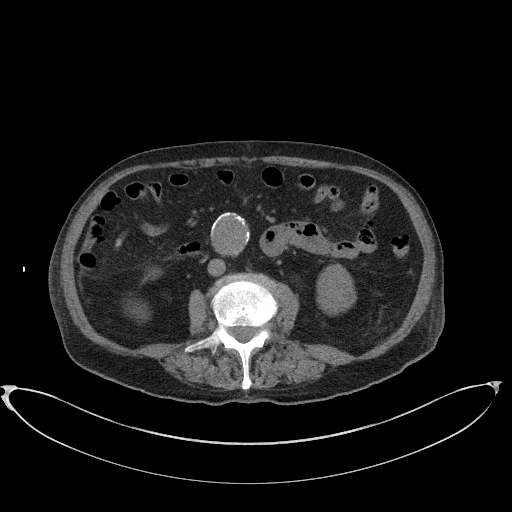
[im 62/97  soft-tissue]
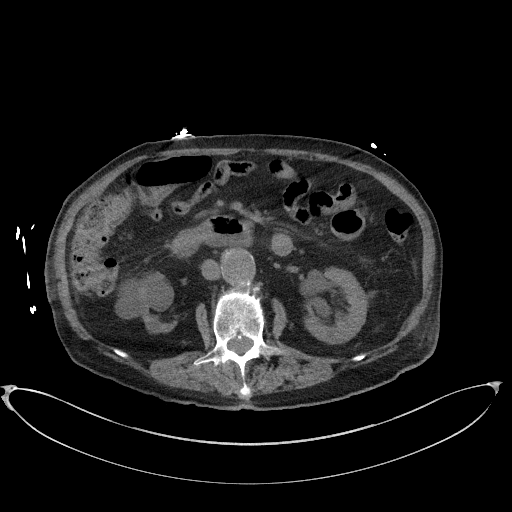
[im 70/97  soft-tissue]
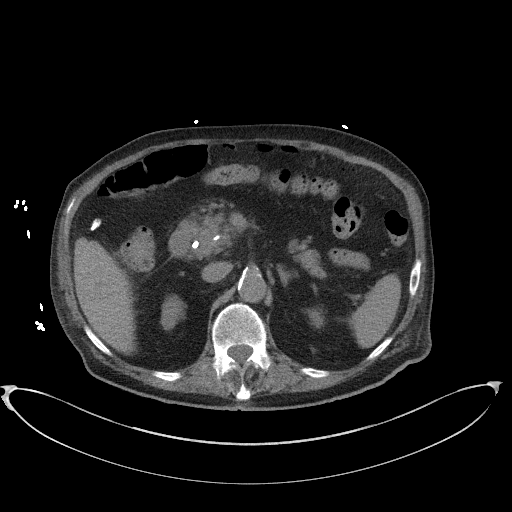
[im 70/97  bone]
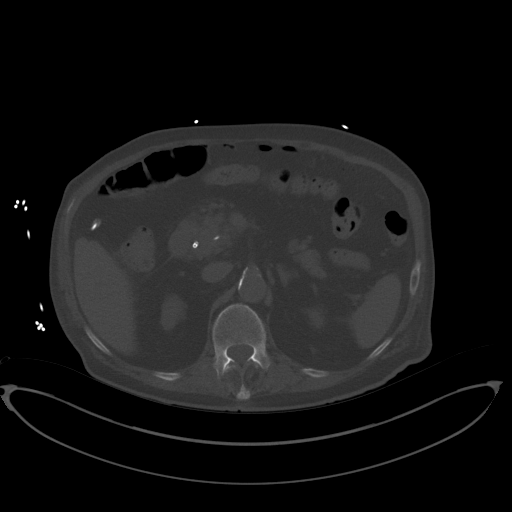
[im 77/97  soft-tissue]
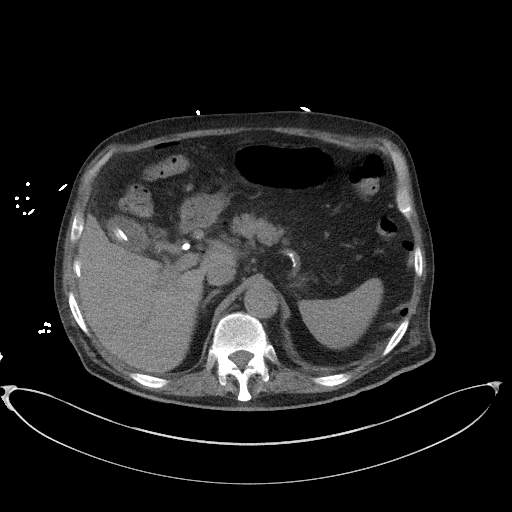
[im 85/97  soft-tissue]
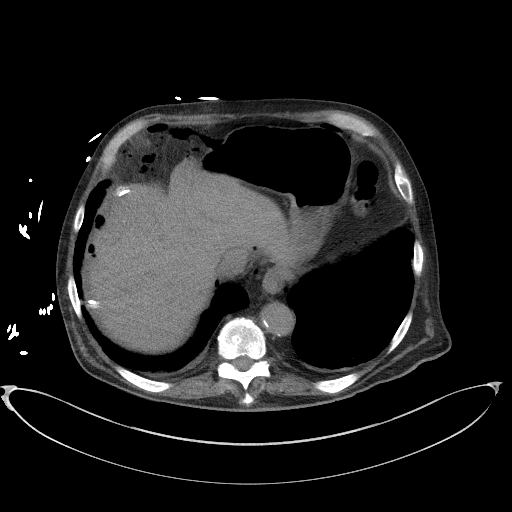
[im 93/97  soft-tissue]
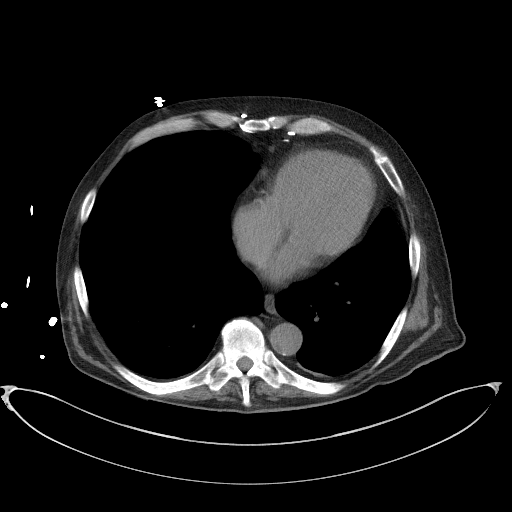

[Series 6: a/p w/o cor · coronal · non-contrast · 0.79mm/px · 3 of 144 slices shown]
[im 48/144  soft-tissue]
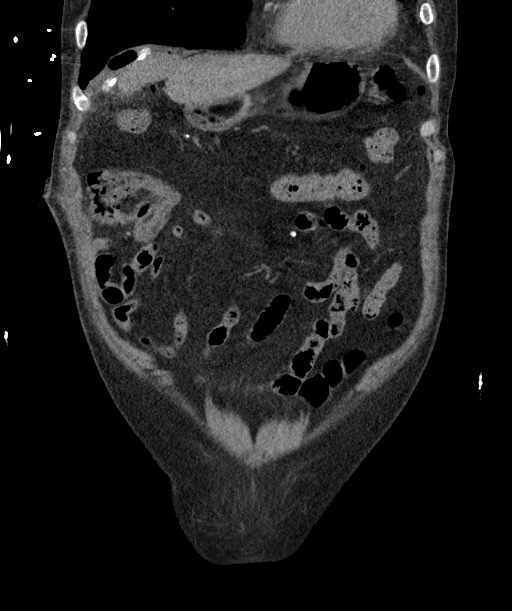
[im 64/144  soft-tissue]
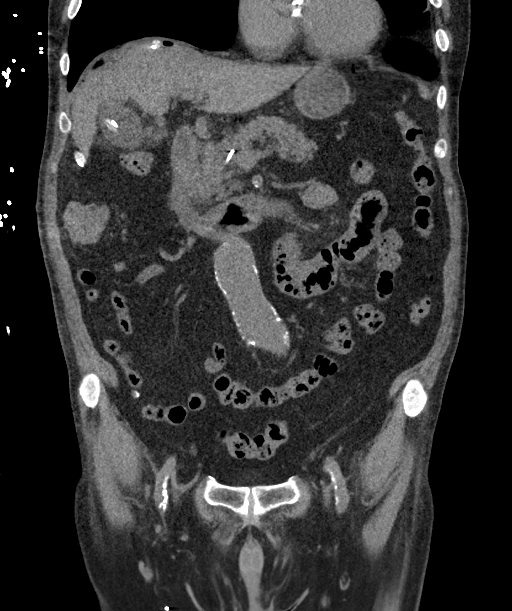
[im 80/144  soft-tissue]
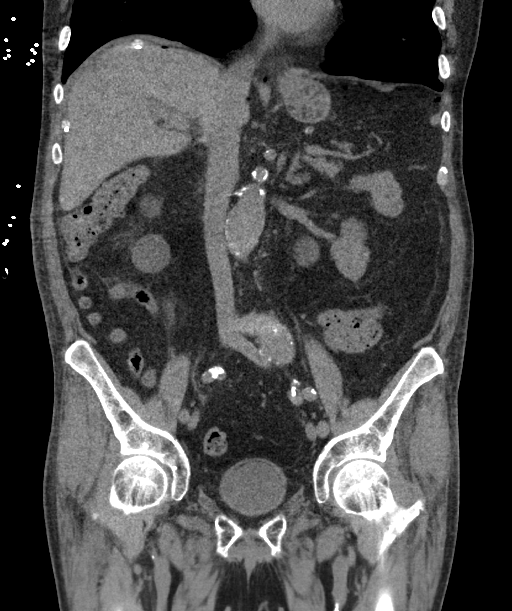

[15 of 46 positions shown; findings below may reference images not displayed]

FINDINGS: Lower chest: Minimal dependent atelectasis in the lung bases.

Hepatobiliary: No focal abnormality in the liver on this study
without intravenous contrast. Fluid collection identified in the
gallbladder fossa measuring approximately 6.1 x 2.4 x 3.3 cm. The JP
drain courses through this collection. Common bile duct stent
visualized in situ without intra or extrahepatic biliary duct
dilatation.

Pancreas: Pancreatic duct stent identified with distal loop formed
in the duodenum. No main duct dilatation.

Spleen: No splenomegaly. No focal mass lesion.

Adrenals/Urinary Tract: No adrenal nodule or mass. Right kidney is
atrophic with multiple cysts evident. Central sinus cysts in both
kidneys were better demonstrated on the previous CT scan performed
with intravenous contrast material. No evidence for hydroureter. The
urinary bladder appears normal for the degree of distention.

Stomach/Bowel: Stomach is unremarkable. No gastric wall thickening.
No evidence of outlet obstruction. Duodenum is normally positioned
as is the ligament of Treitz. No small bowel wall thickening. No
small bowel dilatation. The terminal ileum is normal. The appendix
is normal. No gross colonic mass. No colonic wall thickening.

Vascular/Lymphatic: There is abdominal aortic atherosclerosis
without aneurysm. Distal abdominal aorta is dilated up to 3.6 cm
with aneurysmal dilatation of the left common iliac artery at
cm. Right common iliac artery measures 1.8 cm diameter. There is no
gastrohepatic or hepatoduodenal ligament lymphadenopathy. No
retroperitoneal or mesenteric lymphadenopathy. No pelvic sidewall
lymphadenopathy.

Reproductive: The prostate gland and seminal vesicles are
unremarkable.

Other: No intraperitoneal free fluid. Intraperitoneal free air
scattered in the abdomen is not unexpected on postoperative day 7

Musculoskeletal: No worrisome lytic or sclerotic osseous
abnormality.
IMPRESSION: 1. 6.1 x 2.4 x 3.3 cm fluid collection in the gallbladder fossa
status post cholecystectomy with JP-drain placement. JP drain is
position within this collection.
2. Common bile duct and pancreatic duct stents in situ without intra
or extrahepatic biliary duct dilatation.
3. Atrophic right kidney with multiple bilateral renal cysts.
4. Abdominal aortic and left common iliac artery aneurysms.
5. Aortic Atherosclerosis ([id number]).

## 2022-10-26 NOTE — Death Summary Note (Signed)
DEATH SUMMARY   Patient Details  Name: Juan Benjamin MRN: [id number] DOB: 1931-02-22 WUJ:[id number], Isidor Holts., PA-C Admission/Discharge Information   Admit Date:  2022-10-25  Date of Death: Date of Death: October 27, 2022  Time of Death: Time of Death: 0744  Length of Stay: 2   Principle Cause of death: Encephalopathy secondary to aspiration pneumonia aspiration pneumonia  Hospital Diagnoses: Principal Problem:   End of life care Active Problems:   Aspiration pneumonia (HCC)   Protein-calorie malnutrition, severe   Sepsis (HCC)   Acute on chronic respiratory failure with hypoxia [facility])   Dysphagia   Hospital Course: 89 year old gentleman with history of AAA, aortic stenosis, hypertension, hypothyroidism, paroxysmal A-fib not on anticoagulation who was recently admitted to the hospital with recurrent aspiration pneumonia and extreme weakness.  Patient was transferred to a skilled nursing facility with palliative care referral.  He went to a skilled nursing facility, was agitated, hypotensive and hypoxemic.  Patient himself when he is clear mind had asked them to take him to hospice where his wife was present 10 years ago. Arrived to the ER, on 6 L of oxygen, unresponsive.  Started on comfort care measures with his wishes. Patient was started on comfort care measures and hospice referral initiated. Patient died today with family at the bedside on full comfort measures.  Recurrent aspiration pneumonia due to dysphagia Severe protein calorie malnutrition, adult failure to thrive Acute on chronic respiratory failure with hypoxemia, respiratory distress Frailty and debility Multiple underlying chronic medical issues Acute metabolic encephalopathy         Procedures: None  Consultations: None  The results of significant diagnostics from this hospitalization (including imaging, microbiology, ancillary and laboratory) are listed below for reference.   Significant Diagnostic  Studies: CT CHEST WO CONTRAST  Result Date: 09/25/2022 CLINICAL DATA:  Weakness.  Respiratory illness.  Vomiting. EXAM: CT CHEST WITHOUT CONTRAST TECHNIQUE: Multidetector CT imaging of the chest was performed following the standard protocol without IV contrast. RADIATION DOSE REDUCTION: This exam was performed according to the departmental dose-optimization program which includes automated exposure control, adjustment of the mA and/or kV according to patient size and/or use of iterative reconstruction technique. COMPARISON:  08/10/2022 FINDINGS: Cardiovascular: Coronary, aortic arch, and branch vessel atherosclerotic vascular disease. Prior CABG. Chronically diminutive left common carotid artery. Mediastinum/Nodes: Unremarkable Lungs/Pleura: Centrilobular emphysema. Patchy ground-glass and airspace opacities in the right lower lobe. Cavitary nodular components including a 1.5 by 1.2 cm density on image 96 series 5 and a 1.2 by 1.0 cm similar focal nodular density with internal cavitation on image 105 series 5, roughly similar to 4/20 9/24 although more blurred on that exam due to motion artifact. 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 Upper Abdomen: Pneumobilia. Stable appearance of the renal upper poles. Faint stranding around the gallbladder, cannot exclude gallbladder inflammation. Musculoskeletal: Mild thoracic spondylosis. IMPRESSION: 1. Patchy ground-glass and airspace opacities in the right lower lobe, with some cavitary nodular components. Appearance compatible with atypical infection and is roughly similar to 09/23/2022, but mostly new from 08/10/2022. 2. 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 3. Pneumobilia. 4. Faint stranding around the gallbladder, cannot exclude gallbladder inflammation. Correlate with any right upper quadrant abdominal symptoms. 5. Chronically diminutive left common carotid artery. 6. Coronary, aortic arch, and  branch vessel atherosclerotic vascular disease. Prior CABG. 7. Emphysema. Aortic Atherosclerosis (ICD10-I70.0) and Emphysema (ICD10-J43.9). Electronically Signed   By: Gaylyn Rong M.D.   On: 09/25/2022 15:46   ECHOCARDIOGRAM COMPLETE  Result Date: 09/24/2022    ECHOCARDIOGRAM REPORT   Patient Name:   MONTA BARENTINE Date of Exam: 09/24/2022 Medical Rec #:  [id number]        Height:       67.0 in Accession #:    [id number]       Weight:       135.1 lb Date of Birth:  [birth year]        BSA:          1.712 m Patient Age:    89 years         BP:           126/62 mmHg Patient Gender: M                HR:           67 bpm. Exam Location:  Inpatient Procedure: 2D Echo, Color Doppler and Cardiac Doppler Indications:    Aortic stenosis  History:        Patient has prior history of Echocardiogram examinations. CAD;                 Risk Factors:Hypertension and Dyslipidemia.  Sonographer:    Mike Gip Referring Phys: [phone] JARED M GARDNER IMPRESSIONS  1. Left ventricular ejection fraction, by estimation, is 55%. The left ventricle has low normal function. The left ventricle has no regional wall motion abnormalities. There is moderate asymmetric left ventricular hypertrophy of the septal segment. Indeterminate diastolic filling due to E-A fusion.  2. Right ventricular systolic function is normal. The right ventricular size is normal. There is normal pulmonary artery systolic pressure.  3. The mitral valve is grossly normal. No evidence of mitral valve regurgitation.  4. Suboptimal LVOT VTI assessment precludes accuate LV SV indexing, DVI assessment or continuity equation evaluation. Visual aortic valve appears calcified with reduced opening. Mean gradient 26 mm Hg with acceleration tim 111 ms. Suspect at least moderate to severe AS. The aortic valve is calcified. Aortic valve regurgitation is mild. Moderate to severe aortic valve stenosis. Aortic valve mean gradient measures 26.0 mmHg. Comparison(s): No significant  change from prior study. FINDINGS  Left Ventricle: Left ventricular ejection fraction, by estimation, is 55%. The left ventricle has low normal function. The left ventricle has no regional wall motion abnormalities. The left ventricular internal cavity size was normal in size. There is moderate asymmetric left ventricular hypertrophy of the septal segment. Indeterminate diastolic filling due to E-A fusion. Right Ventricle: The right ventricular size is normal. Right vetricular wall thickness was not well visualized. Right ventricular systolic function is normal. There is normal pulmonary artery systolic pressure. The tricuspid regurgitant velocity is 2.01 m/s, and with an assumed right atrial pressure of 3 mmHg, the estimated right ventricular systolic pressure is 19.2 mmHg. Left Atrium: Left atrial size was normal in size. Right Atrium: Right atrial size was normal in size. Pericardium: There is no evidence of pericardial effusion. Mitral Valve: The mitral valve is grossly normal. No evidence of mitral valve regurgitation. Tricuspid Valve: The tricuspid valve is normal in structure. Tricuspid valve regurgitation is not demonstrated. Aortic Valve: Suboptimal LVOT VTI assessment precludes accuate LV SV indexing, DVI assessment or continuity equation evaluation. Visual aortic valve appears calcified with reduced opening. Mean gradient 26 mm Hg with acceleration tim 111 ms. Suspect at least moderate to severe AS. The aortic valve is calcified. Aortic valve regurgitation is mild. Aortic regurgitation PHT measures 587 msec. Moderate to severe aortic stenosis is present. Aortic valve  mean gradient measures 26.0 mmHg. Aortic valve peak gradient measures 39.3 mmHg. Aortic valve area, by VTI measures 0.53 cm. Pulmonic Valve: The pulmonic valve was grossly normal. Pulmonic valve regurgitation is mild. Aorta: The aortic root and ascending aorta are structurally normal, with no evidence of dilitation. IAS/Shunts: The  interatrial septum was not well visualized.  LEFT VENTRICLE PLAX 2D LVIDd:         3.80 cm     Diastology LVIDs:         2.80 cm     LV e' medial:    4.24 cm/s LV PW:         1.10 cm     LV E/e' medial:  18.2 LV IVS:        1.30 cm     LV e' lateral:   7.18 cm/s LVOT diam:     1.90 cm     LV E/e' lateral: 10.8 LV SV:         38 LV SV Index:   22 LVOT Area:     2.84 cm  LV Volumes (MOD) LV vol d, MOD A2C: 79.1 ml LV vol d, MOD A4C: 89.7 ml LV vol s, MOD A2C: 32.7 ml LV vol s, MOD A4C: 38.9 ml LV SV MOD A2C:     46.4 ml LV SV MOD A4C:     89.7 ml LV SV MOD BP:      53.2 ml RIGHT VENTRICLE            IVC RV Basal diam:  3.60 cm    IVC diam: 1.50 cm RV S prime:     7.72 cm/s TAPSE (M-mode): 1.2 cm LEFT ATRIUM             Index        RIGHT ATRIUM           Index LA diam:        3.50 cm 2.04 cm/m   RA Area:     18.30 cm LA Vol (A2C):   35.1 ml 20.50 ml/m  RA Volume:   48.00 ml  28.04 ml/m LA Vol (A4C):   34.7 ml 20.27 ml/m LA Biplane Vol: 37.2 ml 21.73 ml/m  AORTIC VALVE AV Area (Vmax):    0.62 cm AV Area (Vmean):   0.60 cm AV Area (VTI):     0.53 cm AV Vmax:           313.33 cm/s AV Vmean:          219.667 cm/s AV VTI:            0.716 m AV Peak Grad:      39.3 mmHg AV Mean Grad:      26.0 mmHg LVOT Vmax:         69.00 cm/s LVOT Vmean:        46.700 cm/s LVOT VTI:          0.133 m LVOT/AV VTI ratio: 0.19 AI PHT:            587 msec  AORTA Ao Root diam: 3.80 cm Ao Asc diam:  3.00 cm MITRAL VALVE               TRICUSPID VALVE MV Area (PHT): 6.60 cm    TR Peak grad:   16.2 mmHg MV Decel Time: 115 msec    TR Vmax:        201.00 cm/s MV E velocity: 77.25 cm/s MV A velocity: 99.90 cm/s  SHUNTS MV E/A ratio:  0.77        Systemic VTI:  0.13 m                            Systemic Diam: 1.90 cm Riley Lam MD Electronically signed by Riley Lam MD Signature Date/Time: 09/24/2022/12:35:20 PM    Final    CT ABDOMEN PELVIS WO CONTRAST  Result Date: 09/23/2022 CLINICAL DATA:  Acute abdominal pain  EXAM: CT ABDOMEN AND PELVIS WITHOUT CONTRAST TECHNIQUE: Multidetector CT imaging of the abdomen and pelvis was performed following the standard protocol without IV contrast. RADIATION DOSE REDUCTION: This exam was performed according to the departmental dose-optimization program which includes automated exposure control, adjustment of the mA and/or kV according to patient size and/or use of iterative reconstruction technique. COMPARISON:  CT abdomen and pelvis 06/19/2021 FINDINGS: Lower chest: Emphysematous changes are again seen. There is some scarring in the lung bases. Hepatobiliary: No focal liver abnormality is seen. No gallstones, gallbladder wall thickening, or biliary dilatation. Pancreas: Unremarkable. No pancreatic ductal dilatation or surrounding inflammatory changes. Spleen: Normal in size without focal abnormality. Adrenals/Urinary Tract: Right renal atrophy is stable. There are numerous bilateral renal cysts measuring up to 5.7 cm on the right and 2.0 cm on the left. There is no hydronephrosis or urinary tract calculus. The bladder and adrenal glands are within normal limits. Stomach/Bowel: Stomach is within normal limits. Appendix appears normal. No evidence of bowel wall thickening, distention, or inflammatory changes. There are scattered colonic diverticula. Vascular/Lymphatic: There is aneurysmal dilatation of the infrarenal abdominal aorta measuring 4.5 cm (previously 4.3 cm) there is also aneurysmal dilatation of the left common iliac artery measuring 4.0 cm (unchanged). There severe atherosclerotic calcifications throughout the aorta. No enlarged lymph nodes are seen. Reproductive: Prostate gland is mildly enlarged. Other: There are small fat containing bilateral inguinal hernias. No ascites. Musculoskeletal: Degenerative changes affect the spine. IMPRESSION: 1. No acute localizing process in the abdomen or pelvis. 2. 4.5 cm infrarenal abdominal aortic aneurysm, previously 4.3 cm. Recommend  follow-up CT/MR every 6 months and vascular consultation. This recommendation follows ACR consensus guidelines: White Paper of the ACR Incidental Findings Committee II on Vascular Findings. J Am Coll Radiol 2013; 10:789-794. 3. 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 4. Colonic diverticulosis without evidence for diverticulitis. Aortic Atherosclerosis (ICD10-I70.0). Electronically Signed   By: Darliss Cheney M.D.   On: 09/23/2022 21:25   DG Chest 2 View  Result Date: 09/23/2022 CLINICAL DATA:  Weakness EXAM: CHEST - 2 VIEW COMPARISON:  08/10/2022 FINDINGS: Postsurgical changes of median sternotomy and left axillary surgery. Chronic left basilar scarring. No focal airspace consolidation. Increased peripheral peribronchial opacities, right greater than left. IMPRESSION: Increased peripheral peribronchial opacities, right greater than left, which may represent active infection or sequela of recent pneumonia. Electronically Signed   By: Deatra Robinson M.D.   On: 09/23/2022 19:53    Microbiology: Recent Results (from the past 240 hour(s))  Culture, blood (Routine X 2) w Reflex to ID Panel     Status: None   Collection Time: 09/24/22 11:14 AM   Specimen: BLOOD  Result Value Ref Range Status   Specimen Description   Final    BLOOD BLOOD LEFT ARM AEROBIC BOTTLE ONLY Performed at [facility], [address], [place], [place] [address]    Special Requests   Final    BOTTLES DRAWN AEROBIC ONLY Blood Culture adequate volume Performed at  [facility], [address], [place], [place] [address]    Culture   Final    NO GROWTH 5 DAYS Performed at [facility], [address], [place], [place] [address]    Report Status 09/29/2022 FINAL  Final  Culture, blood (Routine X 2) w Reflex to ID Panel     Status: None   Collection Time: 09/24/22 11:14 AM   Specimen: BLOOD  Result Value Ref Range Status   Specimen Description   Final    BLOOD BLOOD  RIGHT ARM AEROBIC BOTTLE ONLY Performed at [facility], [address], [place], [place] [address]    Special Requests   Final    BOTTLES DRAWN AEROBIC ONLY Blood Culture adequate volume Performed at [facility], [address] [place], [place] [address]    Culture   Final    NO GROWTH 5 DAYS Performed at [facility], [address], [place], [place] [address]    Report Status 09/29/2022 FINAL  Final    Time spent: 25 minutes  Signed: Dorcas Carrow, MD 10-19-2022

## 2022-10-26 DEATH — deceased

## 2023-07-05 IMAGING — CT CT CTA ABD/PEL W/CM AND/OR W/O CM
1 of 4 series · 11 of 32 positions shown, 17 images · IV contrast (APPLIED)
Comparison: 12/27/2020

CLINICAL DATA: 89-year-old male with history of aortic aneurysm.
Surveillance study.

EXAM:
CT ANGIOGRAPHY ABDOMEN AND PELVIS WITH CONTRAST AND WITHOUT CONTRAST
TECHNIQUE: Multidetector CT imaging of the abdomen and pelvis was performed
using the standard protocol during bolus administration of
intravenous contrast. Multiplanar reconstructed images and MIPs were
obtained and reviewed to evaluate the vascular anatomy.
CONTRAST:  60mL WTOMFN-GWF IOPAMIDOL (WTOMFN-GWF) INJECTION 76%

[Series 4: pre stent angio · axial · non-contrast · 0.72mm/px · z∈[-351,+45]mm · 11 of 236 slices shown, 17 images]
[im 19/236  soft-tissue]
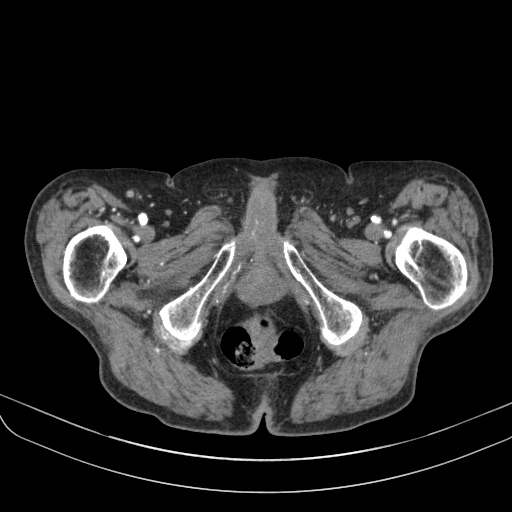
[im 19/236  bone]
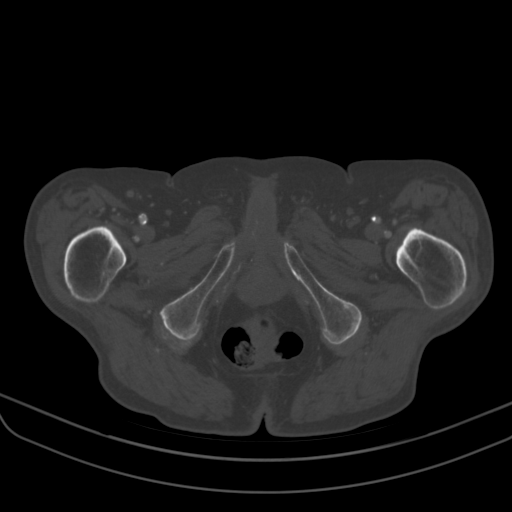
[im 37/236  soft-tissue]
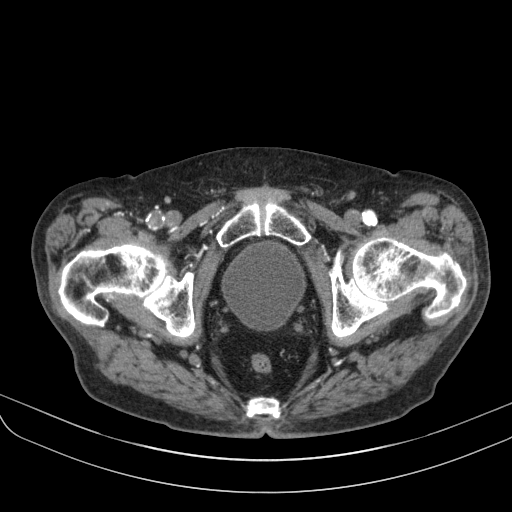
[im 55/236  soft-tissue]
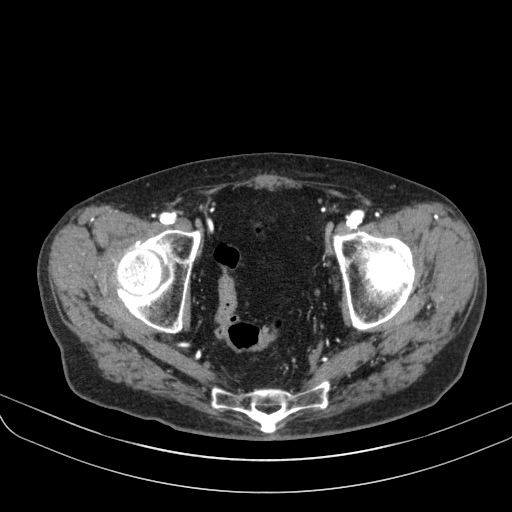
[im 73/236  soft-tissue]
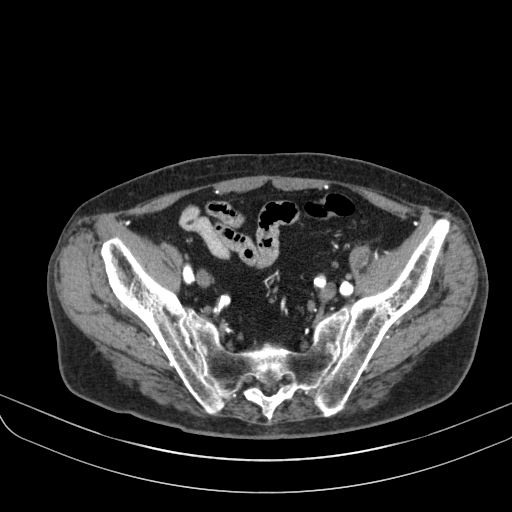
[im 91/236  soft-tissue]
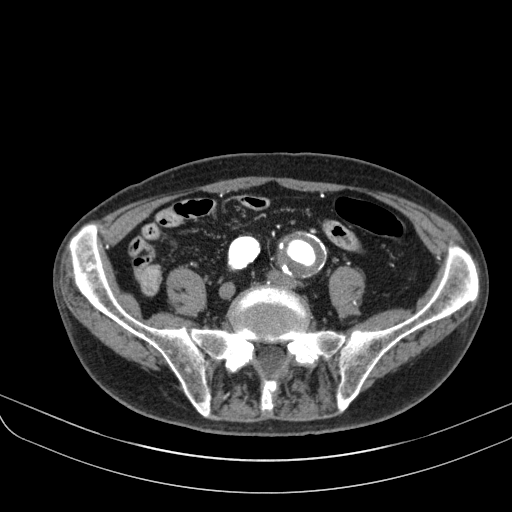
[im 127/236  soft-tissue]
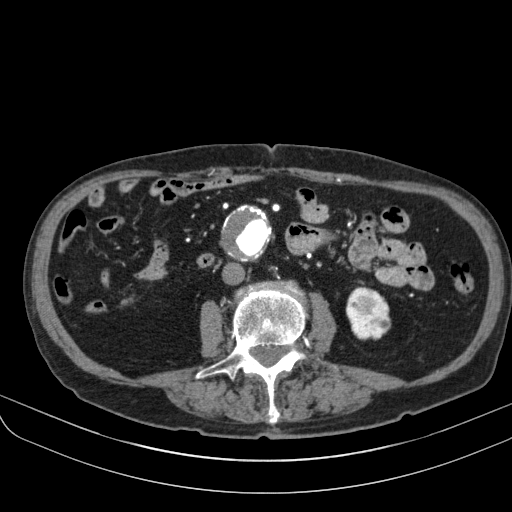
[im 145/236  soft-tissue]
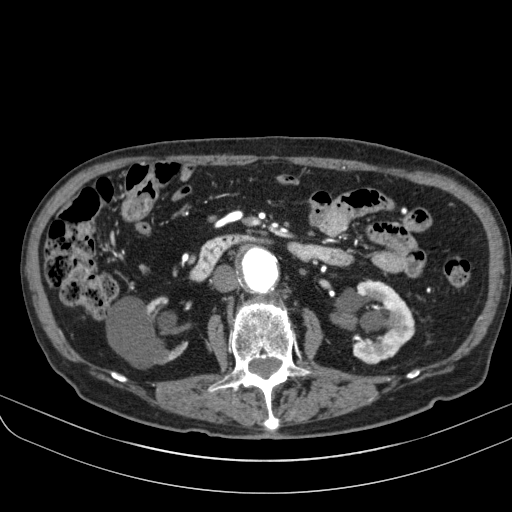
[im 163/236  soft-tissue]
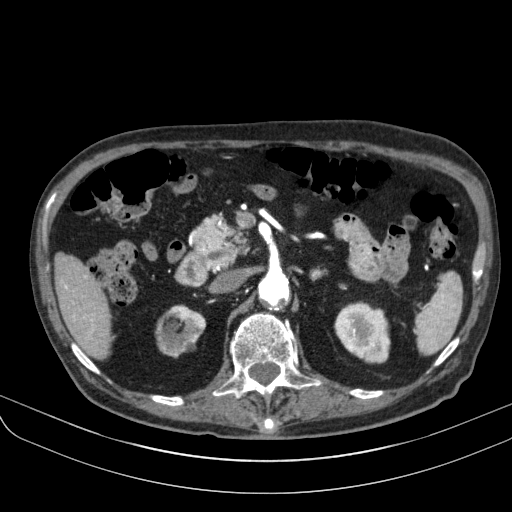
[im 163/236  lung]
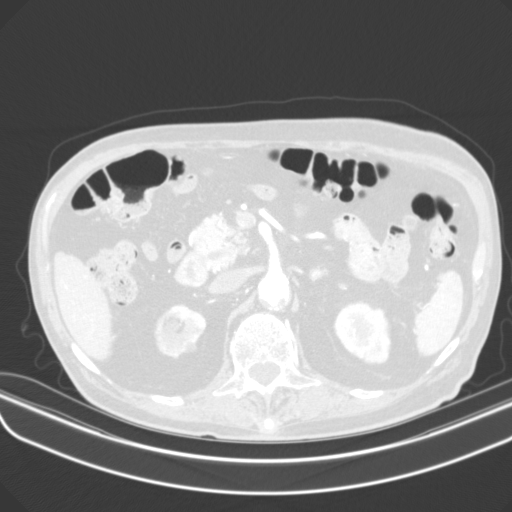
[im 181/236  soft-tissue]
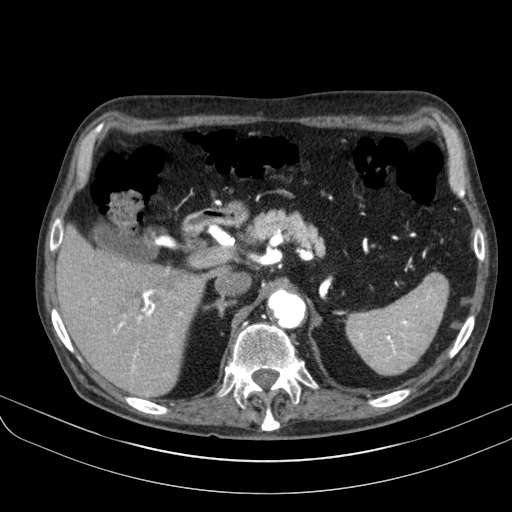
[im 181/236  lung]
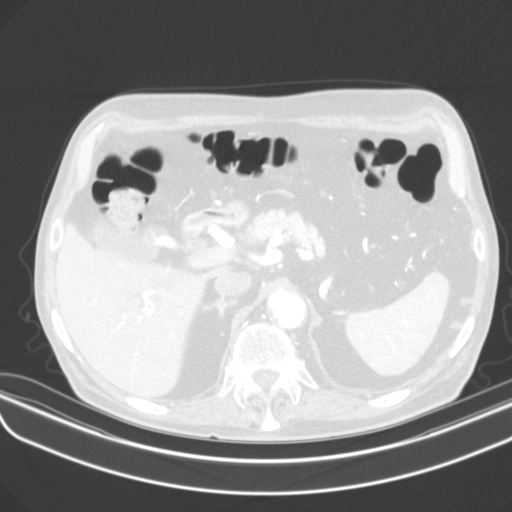
[im 181/236  bone]
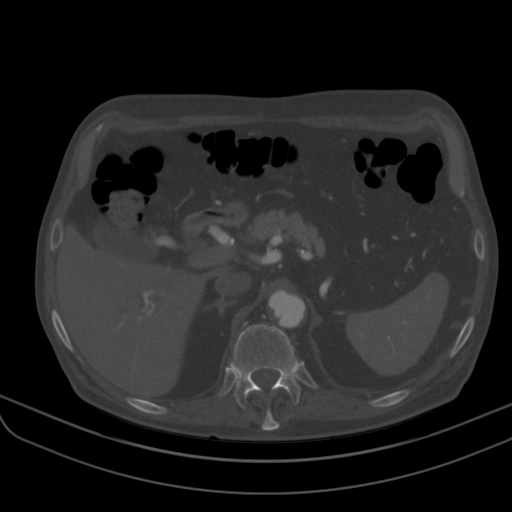
[im 199/236  soft-tissue]
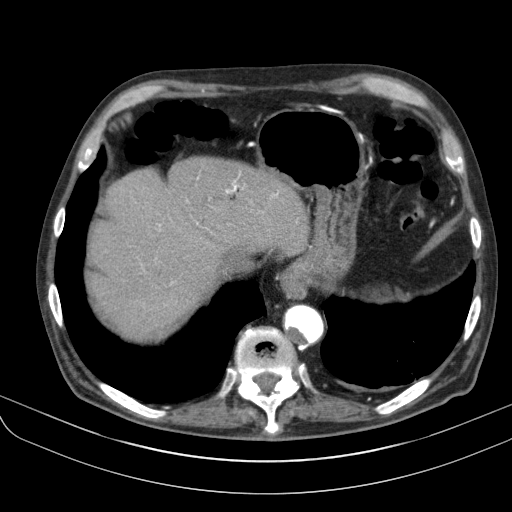
[im 199/236  lung]
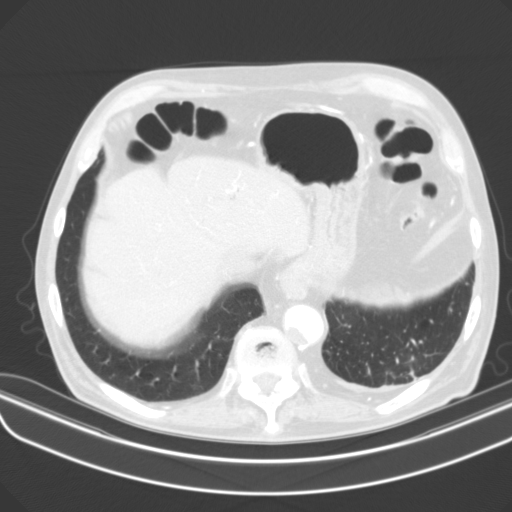
[im 217/236  soft-tissue]
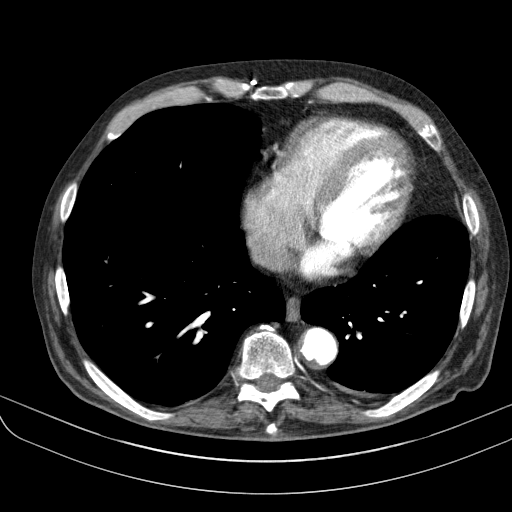
[im 217/236  lung]
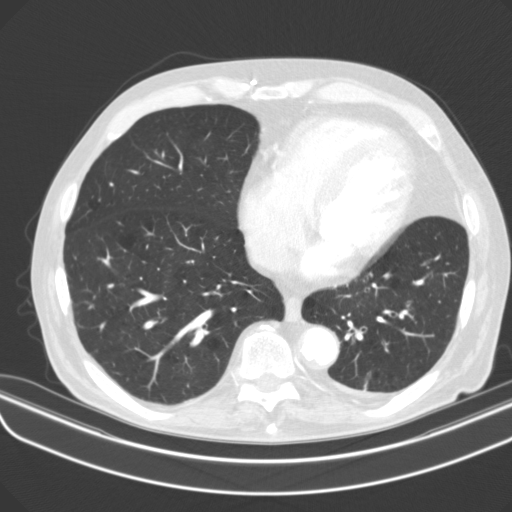

[11 of 32 positions shown; findings below may reference images not displayed]

FINDINGS: VASCULAR

Aorta: Similar appearing fusiform infrarenal abdominal aortic
aneurysm measuring up to 38 mm. Aneurysm extends to the iliac
bifurcation and measures up to 41 mm in the left common iliac
artery.

Celiac: Patent without evidence of aneurysm, dissection, vasculitis
or significant stenosis.

SMA: Patent without evidence of aneurysm, dissection, vasculitis or
significant stenosis.

Renals: Dual right renal arteries with patent indwelling ostial
stent in the more inferior, main right renal artery. Mild ostial
stenosis of the single left renal artery secondary to
atherosclerotic plaque.

IMA: Occluded proximally with distal reconstitution.

Inflow: Fusiform aneurysmal changes of the distal right common iliac
artery which measures up to 18 mm. Severe ostial stenosis of the
left internal iliac artery secondary to atherosclerotic plaque. The
bilateral external iliac arteries are widely patent with scattered
atherosclerotic calcifications.

Proximal Outflow: Occlusion of the distal right common femoral
artery spanning approximately 2.5 cm. The superficial femoral artery
and profundus appear patent proximally.

Veins: No obvious venous abnormality within the limitations of this
arterial phase study.

Review of the MIP images confirms the above findings.

NON-VASCULAR

Lower chest: Similar appearing moderate centrilobular emphysema and
cicatricial atelectasis in the left lower lobe and lingula. Coarse
aortic valvular calcifications. The heart is normal in size. No
pericardial effusion.

Hepatobiliary: The liver is normal in size, contour, and
attenuation. Multifocal peripheral punctate enhancing foci in the
right lobe, likely indicative of portal venous shunting captured on
arterial phase. No enhancing masses. The gallbladder is present
unremarkable. No significant intra or extrahepatic biliary ductal
dilation. There is anti dependent pneumobilia in the left lobe,
unchanged. Interval removal of common bile duct stent.

Pancreas: Unremarkable. No pancreatic ductal dilatation or
surrounding inflammatory changes.

Spleen: Normal in size without focal abnormality.

Adrenals/Urinary Tract: Adrenal glands are unremarkable. Similar
appearance of multifocal simple renal cysts, the largest arising
from the right inferior pole measuring up to 5.7 cm. Kidneys are
otherwise normal, without renal calculi, focal lesion, or
hydronephrosis. Bladder is unremarkable.

Stomach/Bowel: Stomach is within normal limits. Appendix appears
normal. No evidence of bowel wall thickening, distention, or
inflammatory changes.

Lymphatic: No abdominopelvic lymphadenopathy.

Reproductive: Prostate is unremarkable.

Other: No abdominal wall hernia or abnormality. No abdominopelvic
ascites.

Musculoskeletal: No acute or significant osseous findings.
IMPRESSION: VASCULAR

1. Similar appearing fusiform infrarenal abdominal aortoiliac
aneurysm measuring up to 3.8 cm in the infrarenal aorta and 4.1 cm
in the left common iliac artery. Recommend follow-up every 12 months
and vascular consultation. This recommendation follows ACR consensus
guidelines: White Paper of the ACR Incidental Findings Committee II
on Vascular Findings. [HOSPITAL] 5418; [DATE].
2. Short segment occlusion of the right common femoral artery.

NON-VASCULAR

1. No acute abdominopelvic abnormality.
2.  Emphysema ([id number]).
